# Patient Record
Sex: Female | Born: 1950
Health system: Southern US, Community
[De-identification: ages and names within clinical notes are randomized; demographics above are authoritative.]

## PROBLEM LIST (undated history)

## (undated) DIAGNOSIS — M1610 Unilateral primary osteoarthritis, unspecified hip: Secondary | ICD-10-CM

## (undated) DIAGNOSIS — D649 Anemia, unspecified: Secondary | ICD-10-CM

## (undated) DIAGNOSIS — M1712 Unilateral primary osteoarthritis, left knee: Secondary | ICD-10-CM

## (undated) DIAGNOSIS — R7302 Impaired glucose tolerance (oral): Secondary | ICD-10-CM

## (undated) DIAGNOSIS — R1900 Intra-abdominal and pelvic swelling, mass and lump, unspecified site: Secondary | ICD-10-CM

## (undated) DIAGNOSIS — T7840XA Allergy, unspecified, initial encounter: Secondary | ICD-10-CM

## (undated) DIAGNOSIS — E119 Type 2 diabetes mellitus without complications: Secondary | ICD-10-CM

## (undated) DIAGNOSIS — K219 Gastro-esophageal reflux disease without esophagitis: Secondary | ICD-10-CM

## (undated) DIAGNOSIS — H9190 Unspecified hearing loss, unspecified ear: Secondary | ICD-10-CM

## (undated) DIAGNOSIS — F419 Anxiety disorder, unspecified: Secondary | ICD-10-CM

## (undated) DIAGNOSIS — R002 Palpitations: Secondary | ICD-10-CM

## (undated) DIAGNOSIS — M899 Disorder of bone, unspecified: Secondary | ICD-10-CM

## (undated) DIAGNOSIS — M949 Disorder of cartilage, unspecified: Secondary | ICD-10-CM

## (undated) DIAGNOSIS — J189 Pneumonia, unspecified organism: Secondary | ICD-10-CM

## (undated) DIAGNOSIS — J209 Acute bronchitis, unspecified: Secondary | ICD-10-CM

## (undated) DIAGNOSIS — M1711 Unilateral primary osteoarthritis, right knee: Secondary | ICD-10-CM

## (undated) DIAGNOSIS — J069 Acute upper respiratory infection, unspecified: Secondary | ICD-10-CM

## (undated) DIAGNOSIS — E785 Hyperlipidemia, unspecified: Secondary | ICD-10-CM

## (undated) DIAGNOSIS — E041 Nontoxic single thyroid nodule: Secondary | ICD-10-CM

## (undated) HISTORY — PX: HAMMER TOE SURGERY: SHX385

## (undated) HISTORY — DX: Allergy, unspecified, initial encounter: T78.40XA

## (undated) HISTORY — PX: OTHER SURGICAL HISTORY: SHX169

## (undated) HISTORY — DX: Palpitations: R00.2

## (undated) HISTORY — DX: Hyperlipidemia, unspecified: E78.5

## (undated) HISTORY — PX: BREAST EXCISIONAL BIOPSY: SUR124

## (undated) HISTORY — DX: Acute upper respiratory infection, unspecified: J06.9

## (undated) HISTORY — DX: Type 2 diabetes mellitus without complications: E11.9

## (undated) HISTORY — DX: Anxiety disorder, unspecified: F41.9

## (undated) HISTORY — DX: Nontoxic single thyroid nodule: E04.1

## (undated) HISTORY — DX: Acute bronchitis, unspecified: J20.9

## (undated) HISTORY — DX: Impaired glucose tolerance (oral): R73.02

## (undated) HISTORY — DX: Intra-abdominal and pelvic swelling, mass and lump, unspecified site: R19.00

## (undated) HISTORY — DX: Gastro-esophageal reflux disease without esophagitis: K21.9

## (undated) HISTORY — PX: ABDOMINAL SURGERY: SHX537

## (undated) HISTORY — PX: DILATION AND CURETTAGE, DIAGNOSTIC / THERAPEUTIC: SUR384

## (undated) HISTORY — DX: Unspecified hearing loss, unspecified ear: H91.90

## (undated) HISTORY — DX: Disorder of cartilage, unspecified: M94.9

## (undated) HISTORY — DX: Disorder of bone, unspecified: M89.9

## (undated) HISTORY — DX: Anemia, unspecified: D64.9

---

## 1983-04-11 HISTORY — PX: OTHER SURGICAL HISTORY: SHX169

## 1989-04-10 HISTORY — PX: KNEE SURGERY: SHX244

## 1990-04-10 HISTORY — PX: HERNIA REPAIR: SHX51

## 1998-01-03 ENCOUNTER — Emergency Department (HOSPITAL_COMMUNITY): Admission: EM | Admit: 1998-01-03 | Discharge: 1998-01-03 | Payer: Self-pay | Admitting: Emergency Medicine

## 1998-01-17 ENCOUNTER — Emergency Department (HOSPITAL_COMMUNITY): Admission: EM | Admit: 1998-01-17 | Discharge: 1998-01-17 | Payer: Self-pay | Admitting: Emergency Medicine

## 1998-01-18 ENCOUNTER — Emergency Department (HOSPITAL_COMMUNITY): Admission: EM | Admit: 1998-01-18 | Discharge: 1998-01-18 | Payer: Self-pay | Admitting: Emergency Medicine

## 1998-04-10 DIAGNOSIS — R0989 Other specified symptoms and signs involving the circulatory and respiratory systems: Secondary | ICD-10-CM | POA: Insufficient documentation

## 2003-05-06 ENCOUNTER — Encounter: Admission: RE | Admit: 2003-05-06 | Discharge: 2003-05-06 | Payer: Self-pay | Admitting: Obstetrics and Gynecology

## 2003-07-14 ENCOUNTER — Ambulatory Visit (HOSPITAL_COMMUNITY): Admission: RE | Admit: 2003-07-14 | Discharge: 2003-07-14 | Payer: Self-pay | Admitting: Endocrinology

## 2004-06-30 ENCOUNTER — Ambulatory Visit: Payer: Self-pay | Admitting: Internal Medicine

## 2004-07-11 ENCOUNTER — Ambulatory Visit (HOSPITAL_COMMUNITY): Admission: RE | Admit: 2004-07-11 | Discharge: 2004-07-11 | Payer: Self-pay | Admitting: Internal Medicine

## 2004-09-19 ENCOUNTER — Ambulatory Visit: Payer: Self-pay | Admitting: Endocrinology

## 2004-09-29 ENCOUNTER — Encounter: Admission: RE | Admit: 2004-09-29 | Discharge: 2004-09-29 | Payer: Self-pay | Admitting: Obstetrics and Gynecology

## 2004-10-04 ENCOUNTER — Ambulatory Visit: Payer: Self-pay | Admitting: Endocrinology

## 2004-10-06 ENCOUNTER — Ambulatory Visit: Payer: Self-pay | Admitting: Endocrinology

## 2004-11-07 ENCOUNTER — Ambulatory Visit: Payer: Self-pay | Admitting: Internal Medicine

## 2004-11-11 ENCOUNTER — Ambulatory Visit: Payer: Self-pay | Admitting: Endocrinology

## 2004-12-19 ENCOUNTER — Ambulatory Visit: Payer: Self-pay | Admitting: Endocrinology

## 2005-03-01 ENCOUNTER — Ambulatory Visit: Payer: Self-pay | Admitting: Endocrinology

## 2005-06-12 ENCOUNTER — Ambulatory Visit: Payer: Self-pay | Admitting: Endocrinology

## 2005-06-15 ENCOUNTER — Ambulatory Visit: Payer: Self-pay | Admitting: Endocrinology

## 2005-06-30 ENCOUNTER — Ambulatory Visit: Payer: Self-pay | Admitting: Internal Medicine

## 2005-07-03 ENCOUNTER — Ambulatory Visit: Payer: Self-pay | Admitting: Endocrinology

## 2005-07-13 ENCOUNTER — Ambulatory Visit: Payer: Self-pay | Admitting: Internal Medicine

## 2005-07-13 LAB — HM COLONOSCOPY

## 2005-08-29 ENCOUNTER — Ambulatory Visit: Payer: Self-pay | Admitting: Internal Medicine

## 2005-09-21 ENCOUNTER — Ambulatory Visit: Payer: Self-pay | Admitting: Endocrinology

## 2005-12-18 ENCOUNTER — Ambulatory Visit: Payer: Self-pay | Admitting: Internal Medicine

## 2006-05-09 ENCOUNTER — Ambulatory Visit: Payer: Self-pay | Admitting: Internal Medicine

## 2006-06-15 ENCOUNTER — Ambulatory Visit: Payer: Self-pay | Admitting: Endocrinology

## 2006-06-15 LAB — CONVERTED CEMR LAB
ALT: 18 units/L (ref 0–40)
AST: 23 units/L (ref 0–37)
Albumin: 3.3 g/dL — ABNORMAL LOW (ref 3.5–5.2)
Alkaline Phosphatase: 54 units/L (ref 39–117)
BUN: 14 mg/dL (ref 6–23)
Basophils Absolute: 0 10*3/uL (ref 0.0–0.1)
Basophils Relative: 0.5 % (ref 0.0–1.0)
Bilirubin Urine: NEGATIVE
Bilirubin, Direct: 0.1 mg/dL (ref 0.0–0.3)
CO2: 28 meq/L (ref 19–32)
Calcium: 8.6 mg/dL (ref 8.4–10.5)
Chloride: 105 meq/L (ref 96–112)
Cholesterol: 144 mg/dL (ref 0–200)
Creatinine, Ser: 0.7 mg/dL (ref 0.4–1.2)
Eosinophils Absolute: 0.1 10*3/uL (ref 0.0–0.6)
Eosinophils Relative: 2 % (ref 0.0–5.0)
GFR calc Af Amer: 113 mL/min
GFR calc non Af Amer: 93 mL/min
Glucose, Bld: 93 mg/dL (ref 70–99)
HCT: 40.6 % (ref 36.0–46.0)
HDL: 49.7 mg/dL (ref >39.0)
Hemoglobin, Urine: NEGATIVE
Hemoglobin: 13.1 g/dL (ref 12.0–15.0)
Ketones, ur: NEGATIVE mg/dL
LDL Cholesterol: 75 mg/dL (ref 0–99)
Leukocytes, UA: NEGATIVE
Lymphocytes Relative: 32 % (ref 12.0–46.0)
MCHC: 32.3 g/dL (ref 30.0–36.0)
MCV: 74 fL — ABNORMAL LOW (ref 78.0–100.0)
Monocytes Absolute: 0.7 10*3/uL (ref 0.2–0.7)
Monocytes Relative: 13.7 % — ABNORMAL HIGH (ref 3.0–11.0)
Neutro Abs: 2.7 10*3/uL (ref 1.4–7.7)
Neutrophils Relative %: 51.8 % (ref 43.0–77.0)
Nitrite: NEGATIVE
Platelets: 219 10*3/uL (ref 150–400)
Potassium: 4.2 meq/L (ref 3.5–5.1)
RBC: 5.48 M/uL — ABNORMAL HIGH (ref 3.87–5.11)
RDW: 13.8 % (ref 11.5–14.6)
Sodium: 138 meq/L (ref 135–145)
Specific Gravity, Urine: 1.025 (ref 1.000–1.03)
TSH: 0.9 microintl units/mL (ref 0.35–5.50)
Total Bilirubin: 0.6 mg/dL (ref 0.3–1.2)
Total CHOL/HDL Ratio: 2.9
Total Protein, Urine: NEGATIVE mg/dL
Total Protein: 6.4 g/dL (ref 6.0–8.3)
Triglycerides: 96 mg/dL (ref 0–149)
Urine Glucose: NEGATIVE mg/dL
Urobilinogen, UA: 0.2 (ref 0.0–1.0)
VLDL: 19 mg/dL (ref 0–40)
WBC: 5.2 10*3/uL (ref 4.5–10.5)
pH: 6 (ref 5.0–8.0)

## 2006-06-18 ENCOUNTER — Ambulatory Visit: Payer: Self-pay | Admitting: Endocrinology

## 2006-09-20 ENCOUNTER — Encounter: Payer: Self-pay | Admitting: Endocrinology

## 2006-10-17 ENCOUNTER — Ambulatory Visit: Payer: Self-pay | Admitting: Endocrinology

## 2006-10-24 ENCOUNTER — Encounter: Payer: Self-pay | Admitting: Endocrinology

## 2006-11-02 ENCOUNTER — Encounter: Payer: Self-pay | Admitting: Endocrinology

## 2007-02-14 ENCOUNTER — Telehealth (INDEPENDENT_AMBULATORY_CARE_PROVIDER_SITE_OTHER): Payer: Self-pay | Admitting: *Deleted

## 2007-04-19 ENCOUNTER — Ambulatory Visit: Payer: Self-pay | Admitting: Internal Medicine

## 2007-04-19 DIAGNOSIS — M549 Dorsalgia, unspecified: Secondary | ICD-10-CM | POA: Insufficient documentation

## 2007-04-20 DIAGNOSIS — M949 Disorder of cartilage, unspecified: Secondary | ICD-10-CM | POA: Insufficient documentation

## 2007-04-20 DIAGNOSIS — M899 Disorder of bone, unspecified: Secondary | ICD-10-CM

## 2007-04-20 HISTORY — DX: Disorder of bone, unspecified: M89.9

## 2007-06-06 ENCOUNTER — Ambulatory Visit: Payer: Self-pay | Admitting: Endocrinology

## 2007-06-06 DIAGNOSIS — J069 Acute upper respiratory infection, unspecified: Secondary | ICD-10-CM

## 2007-06-06 HISTORY — DX: Acute upper respiratory infection, unspecified: J06.9

## 2007-06-25 ENCOUNTER — Ambulatory Visit: Payer: Self-pay | Admitting: Endocrinology

## 2007-06-26 LAB — CONVERTED CEMR LAB
ALT: 22 units/L (ref 0–35)
AST: 20 units/L (ref 0–37)
Alkaline Phosphatase: 73 units/L (ref 39–117)
BUN: 17 mg/dL (ref 6–23)
Basophils Absolute: 0.1 10*3/uL (ref 0.0–0.1)
Basophils Relative: 1.2 % — ABNORMAL HIGH (ref 0.0–1.0)
Bilirubin, Direct: 0.1 mg/dL (ref 0.0–0.3)
CO2: 29 meq/L (ref 19–32)
Calcium: 9.1 mg/dL (ref 8.4–10.5)
Cholesterol: 144 mg/dL (ref 0–200)
Creatinine, Ser: 0.7 mg/dL (ref 0.4–1.2)
Eosinophils Absolute: 0.2 10*3/uL (ref 0.0–0.6)
Eosinophils Relative: 2.7 % (ref 0.0–5.0)
GFR calc Af Amer: 112 mL/min
GFR calc non Af Amer: 93 mL/min
Glucose, Bld: 130 mg/dL — ABNORMAL HIGH (ref 70–99)
HCT: 40 % (ref 36.0–46.0)
Hemoglobin, Urine: NEGATIVE
Hemoglobin: 12.6 g/dL (ref 12.0–15.0)
Ketones, ur: NEGATIVE mg/dL
LDL Cholesterol: 82 mg/dL (ref 0–99)
Leukocytes, UA: NEGATIVE
Lymphocytes Relative: 34.8 % (ref 12.0–46.0)
MCHC: 31.5 g/dL (ref 30.0–36.0)
MCV: 73.7 fL — ABNORMAL LOW (ref 78.0–100.0)
Neutro Abs: 2.8 10*3/uL (ref 1.4–7.7)
Neutrophils Relative %: 50.1 % (ref 43.0–77.0)
Nitrite: NEGATIVE
Platelets: 289 10*3/uL (ref 150–400)
Potassium: 4.3 meq/L (ref 3.5–5.1)
RBC: 5.44 M/uL — ABNORMAL HIGH (ref 3.87–5.11)
RDW: 14.2 % (ref 11.5–14.6)
Sodium: 139 meq/L (ref 135–145)
Specific Gravity, Urine: >=1.03 (ref 1.000–1.03)
TSH: 1.19 microintl units/mL (ref 0.35–5.50)
Total CHOL/HDL Ratio: 3.2
Total Protein, Urine: NEGATIVE mg/dL
Total Protein: 6.7 g/dL (ref 6.0–8.3)
Triglycerides: 85 mg/dL (ref 0–149)
Urine Glucose: NEGATIVE mg/dL
Urobilinogen, UA: 0.2 (ref 0.0–1.0)
WBC: 5.7 10*3/uL (ref 4.5–10.5)

## 2007-06-27 ENCOUNTER — Ambulatory Visit: Payer: Self-pay | Admitting: Endocrinology

## 2007-09-13 ENCOUNTER — Encounter: Payer: Self-pay | Admitting: Endocrinology

## 2007-09-18 ENCOUNTER — Ambulatory Visit: Payer: Self-pay | Admitting: Endocrinology

## 2007-09-18 DIAGNOSIS — E119 Type 2 diabetes mellitus without complications: Secondary | ICD-10-CM

## 2007-09-18 DIAGNOSIS — R1900 Intra-abdominal and pelvic swelling, mass and lump, unspecified site: Secondary | ICD-10-CM

## 2007-09-18 HISTORY — DX: Type 2 diabetes mellitus without complications: E11.9

## 2007-09-18 HISTORY — DX: Intra-abdominal and pelvic swelling, mass and lump, unspecified site: R19.00

## 2007-09-18 LAB — CONVERTED CEMR LAB
ALT: 19 units/L (ref 0–35)
AST: 17 units/L (ref 0–37)
Albumin: 3.4 g/dL — ABNORMAL LOW (ref 3.5–5.2)
Alkaline Phosphatase: 67 units/L (ref 39–117)
Amylase: 82 units/L (ref 27–131)
BUN: 19 mg/dL (ref 6–23)
Basophils Absolute: 0.1 10*3/uL (ref 0.0–0.1)
Basophils Relative: 2 % — ABNORMAL HIGH (ref 0.0–1.0)
Bilirubin, Direct: 0.1 mg/dL (ref 0.0–0.3)
CO2: 30 meq/L (ref 19–32)
Calcium: 8.8 mg/dL (ref 8.4–10.5)
Chloride: 107 meq/L (ref 96–112)
Creatinine, Ser: 0.6 mg/dL (ref 0.4–1.2)
Eosinophils Absolute: 0.1 10*3/uL (ref 0.0–0.7)
Eosinophils Relative: 2.4 % (ref 0.0–5.0)
GFR calc Af Amer: 134 mL/min
GFR calc non Af Amer: 111 mL/min
Glucose, Bld: 108 mg/dL — ABNORMAL HIGH (ref 70–99)
HCT: 37.8 % (ref 36.0–46.0)
Hemoglobin: 12.4 g/dL (ref 12.0–15.0)
Hgb A1c MFr Bld: 6.2 % — ABNORMAL HIGH (ref 4.6–6.0)
Lymphocytes Relative: 30.9 % (ref 12.0–46.0)
MCHC: 32.8 g/dL (ref 30.0–36.0)
MCV: 75 fL — ABNORMAL LOW (ref 78.0–100.0)
Monocytes Absolute: 0.6 10*3/uL (ref 0.1–1.0)
Monocytes Relative: 10.7 % (ref 3.0–12.0)
Neutro Abs: 3.3 10*3/uL (ref 1.4–7.7)
Neutrophils Relative %: 54 % (ref 43.0–77.0)
Platelets: 211 10*3/uL (ref 150–400)
Potassium: 4.6 meq/L (ref 3.5–5.1)
RBC: 5.04 M/uL (ref 3.87–5.11)
RDW: 14 % (ref 11.5–14.6)
Sed Rate: 7 mm/hr (ref 0–22)
Sodium: 139 meq/L (ref 135–145)
Total Bilirubin: 0.4 mg/dL (ref 0.3–1.2)
Total Protein: 6.2 g/dL (ref 6.0–8.3)
WBC: 5.9 10*3/uL (ref 4.5–10.5)

## 2007-09-19 ENCOUNTER — Telehealth: Payer: Self-pay | Admitting: Internal Medicine

## 2007-09-19 ENCOUNTER — Encounter (INDEPENDENT_AMBULATORY_CARE_PROVIDER_SITE_OTHER): Payer: Self-pay | Admitting: *Deleted

## 2007-09-23 ENCOUNTER — Encounter: Payer: Self-pay | Admitting: Endocrinology

## 2007-10-04 ENCOUNTER — Ambulatory Visit: Payer: Self-pay | Admitting: Cardiology

## 2007-10-25 DIAGNOSIS — R142 Eructation: Secondary | ICD-10-CM

## 2007-10-25 DIAGNOSIS — R141 Gas pain: Secondary | ICD-10-CM

## 2007-10-25 DIAGNOSIS — R143 Flatulence: Secondary | ICD-10-CM

## 2007-10-29 ENCOUNTER — Ambulatory Visit: Payer: Self-pay | Admitting: Internal Medicine

## 2007-10-30 ENCOUNTER — Telehealth: Payer: Self-pay | Admitting: Internal Medicine

## 2007-10-31 ENCOUNTER — Encounter: Payer: Self-pay | Admitting: Internal Medicine

## 2007-12-02 ENCOUNTER — Telehealth: Payer: Self-pay | Admitting: Internal Medicine

## 2008-03-13 ENCOUNTER — Ambulatory Visit: Payer: Self-pay | Admitting: Endocrinology

## 2008-03-13 DIAGNOSIS — R002 Palpitations: Secondary | ICD-10-CM

## 2008-03-13 HISTORY — DX: Palpitations: R00.2

## 2008-03-13 LAB — CONVERTED CEMR LAB
Hgb A1c MFr Bld: 6.2 % — ABNORMAL HIGH (ref 4.6–6.0)
TSH: 0.84 microintl units/mL (ref 0.35–5.50)

## 2008-05-11 ENCOUNTER — Ambulatory Visit: Payer: Self-pay | Admitting: Internal Medicine

## 2008-05-11 DIAGNOSIS — E041 Nontoxic single thyroid nodule: Secondary | ICD-10-CM

## 2008-05-11 DIAGNOSIS — J209 Acute bronchitis, unspecified: Secondary | ICD-10-CM

## 2008-05-11 HISTORY — DX: Nontoxic single thyroid nodule: E04.1

## 2008-05-11 HISTORY — DX: Acute bronchitis, unspecified: J20.9

## 2008-05-20 ENCOUNTER — Encounter (INDEPENDENT_AMBULATORY_CARE_PROVIDER_SITE_OTHER): Payer: Self-pay | Admitting: *Deleted

## 2008-06-02 ENCOUNTER — Encounter: Payer: Self-pay | Admitting: Internal Medicine

## 2008-07-20 ENCOUNTER — Ambulatory Visit: Payer: Self-pay | Admitting: Endocrinology

## 2008-07-22 LAB — CONVERTED CEMR LAB
ALT: 21 units/L (ref 0–35)
AST: 21 units/L (ref 0–37)
Albumin: 3.8 g/dL (ref 3.5–5.2)
Alkaline Phosphatase: 64 units/L (ref 39–117)
BUN: 19 mg/dL (ref 6–23)
Basophils Absolute: 0 10*3/uL (ref 0.0–0.1)
Basophils Relative: 0.8 % (ref 0.0–3.0)
Bilirubin Urine: NEGATIVE
Bilirubin, Direct: 0.1 mg/dL (ref 0.0–0.3)
CO2: 31 meq/L (ref 19–32)
Calcium: 9.1 mg/dL (ref 8.4–10.5)
Chloride: 106 meq/L (ref 96–112)
Cholesterol: 183 mg/dL (ref 0–200)
Creatinine, Ser: 0.7 mg/dL (ref 0.4–1.2)
Eosinophils Absolute: 0.1 10*3/uL (ref 0.0–0.7)
Eosinophils Relative: 2.6 % (ref 0.0–5.0)
GFR calc non Af Amer: 111.52 mL/min (ref >60)
Glucose, Bld: 101 mg/dL — ABNORMAL HIGH (ref 70–99)
HCT: 39.5 % (ref 36.0–46.0)
HDL: 49.8 mg/dL (ref >39.00)
Hemoglobin, Urine: NEGATIVE
Hemoglobin: 12.9 g/dL (ref 12.0–15.0)
Ketones, ur: NEGATIVE mg/dL
LDL Cholesterol: 101 mg/dL — ABNORMAL HIGH (ref 0–99)
Leukocytes, UA: NEGATIVE
Lymphocytes Relative: 34.6 % (ref 12.0–46.0)
Lymphs Abs: 1.9 10*3/uL (ref 0.7–4.0)
MCHC: 32.7 g/dL (ref 30.0–36.0)
MCV: 74.6 fL — ABNORMAL LOW (ref 78.0–100.0)
Monocytes Absolute: 0.6 10*3/uL (ref 0.1–1.0)
Monocytes Relative: 11.4 % (ref 3.0–12.0)
Neutro Abs: 2.8 10*3/uL (ref 1.4–7.7)
Neutrophils Relative %: 50.6 % (ref 43.0–77.0)
Nitrite: NEGATIVE
Platelets: 242 10*3/uL (ref 150.0–400.0)
Potassium: 4.9 meq/L (ref 3.5–5.1)
RBC: 5.3 M/uL — ABNORMAL HIGH (ref 3.87–5.11)
RDW: 13.9 % (ref 11.5–14.6)
Sodium: 141 meq/L (ref 135–145)
Specific Gravity, Urine: >=1.03 (ref 1.000–1.030)
TSH: 0.63 microintl units/mL (ref 0.35–5.50)
Total Bilirubin: 0.7 mg/dL (ref 0.3–1.2)
Total CHOL/HDL Ratio: 4
Total Protein, Urine: NEGATIVE mg/dL
Total Protein: 7 g/dL (ref 6.0–8.3)
Triglycerides: 160 mg/dL — ABNORMAL HIGH (ref 0.0–149.0)
Urine Glucose: NEGATIVE mg/dL
Urobilinogen, UA: 0.2 (ref 0.0–1.0)
VLDL: 32 mg/dL (ref 0.0–40.0)
WBC: 5.4 10*3/uL (ref 4.5–10.5)
pH: 5.5 (ref 5.0–8.0)

## 2008-07-23 ENCOUNTER — Ambulatory Visit: Payer: Self-pay | Admitting: Endocrinology

## 2009-01-13 ENCOUNTER — Ambulatory Visit: Payer: Self-pay | Admitting: Endocrinology

## 2009-01-15 LAB — CONVERTED CEMR LAB
Hgb A1c MFr Bld: 6.1 % (ref 4.6–6.5)
Sed Rate: 8 mm/hr (ref 0–22)

## 2009-05-27 ENCOUNTER — Ambulatory Visit: Payer: Self-pay | Admitting: Endocrinology

## 2009-09-13 ENCOUNTER — Ambulatory Visit: Payer: Self-pay | Admitting: Endocrinology

## 2009-09-13 LAB — CONVERTED CEMR LAB
ALT: 25 units/L (ref 0–35)
AST: 26 units/L (ref 0–37)
Albumin: 4.3 g/dL (ref 3.5–5.2)
Alkaline Phosphatase: 66 units/L (ref 39–117)
BUN: 21 mg/dL (ref 6–23)
Basophils Absolute: 0.1 10*3/uL (ref 0.0–0.1)
Basophils Relative: 0.9 % (ref 0.0–3.0)
Bilirubin Urine: NEGATIVE
Bilirubin, Direct: 0.1 mg/dL (ref 0.0–0.3)
CO2: 31 meq/L (ref 19–32)
Calcium: 9.3 mg/dL (ref 8.4–10.5)
Chloride: 106 meq/L (ref 96–112)
Cholesterol: 237 mg/dL — ABNORMAL HIGH (ref 0–200)
Creatinine, Ser: 0.6 mg/dL (ref 0.4–1.2)
Direct LDL: 151.5 mg/dL
Eosinophils Absolute: 0.2 10*3/uL (ref 0.0–0.7)
Eosinophils Relative: 2.5 % (ref 0.0–5.0)
GFR calc non Af Amer: 127.75 mL/min (ref >60)
Glucose, Bld: 101 mg/dL — ABNORMAL HIGH (ref 70–99)
HCT: 40 % (ref 36.0–46.0)
HDL: 64.8 mg/dL (ref >39.00)
Hemoglobin, Urine: NEGATIVE
Hemoglobin: 13.2 g/dL (ref 12.0–15.0)
Ketones, ur: NEGATIVE mg/dL
Leukocytes, UA: NEGATIVE
Lymphocytes Relative: 38.2 % (ref 12.0–46.0)
Lymphs Abs: 2.4 10*3/uL (ref 0.7–4.0)
MCHC: 33 g/dL (ref 30.0–36.0)
MCV: 75.3 fL — ABNORMAL LOW (ref 78.0–100.0)
Monocytes Absolute: 0.6 10*3/uL (ref 0.1–1.0)
Monocytes Relative: 10.4 % (ref 3.0–12.0)
Neutro Abs: 3 10*3/uL (ref 1.4–7.7)
Neutrophils Relative %: 48 % (ref 43.0–77.0)
Nitrite: NEGATIVE
Platelets: 240 10*3/uL (ref 150.0–400.0)
Potassium: 4.4 meq/L (ref 3.5–5.1)
RBC: 5.31 M/uL — ABNORMAL HIGH (ref 3.87–5.11)
RDW: 15.1 % — ABNORMAL HIGH (ref 11.5–14.6)
Sodium: 142 meq/L (ref 135–145)
Specific Gravity, Urine: >=1.03 (ref 1.000–1.030)
TSH: 0.99 microintl units/mL (ref 0.35–5.50)
Total Bilirubin: 0.6 mg/dL (ref 0.3–1.2)
Total CHOL/HDL Ratio: 4
Total Protein, Urine: NEGATIVE mg/dL
Total Protein: 7.4 g/dL (ref 6.0–8.3)
Triglycerides: 144 mg/dL (ref 0.0–149.0)
Urine Glucose: NEGATIVE mg/dL
Urobilinogen, UA: 0.2 (ref 0.0–1.0)
VLDL: 28.8 mg/dL (ref 0.0–40.0)
WBC: 6.2 10*3/uL (ref 4.5–10.5)
pH: 5.5 (ref 5.0–8.0)

## 2009-09-15 ENCOUNTER — Ambulatory Visit: Payer: Self-pay | Admitting: Endocrinology

## 2009-09-15 DIAGNOSIS — H9190 Unspecified hearing loss, unspecified ear: Secondary | ICD-10-CM

## 2009-09-15 HISTORY — DX: Unspecified hearing loss, unspecified ear: H91.90

## 2009-10-08 LAB — HM MAMMOGRAPHY: HM Mammogram: NORMAL

## 2009-10-08 LAB — CONVERTED CEMR LAB: Pap Smear: NORMAL

## 2010-03-10 ENCOUNTER — Ambulatory Visit: Payer: Self-pay | Admitting: Endocrinology

## 2010-03-15 ENCOUNTER — Encounter (INDEPENDENT_AMBULATORY_CARE_PROVIDER_SITE_OTHER): Payer: Self-pay | Admitting: *Deleted

## 2010-04-25 ENCOUNTER — Encounter: Payer: Self-pay | Admitting: Internal Medicine

## 2010-04-25 ENCOUNTER — Ambulatory Visit
Admission: RE | Admit: 2010-04-25 | Discharge: 2010-04-25 | Payer: Self-pay | Source: Home / Self Care | Attending: Internal Medicine | Admitting: Internal Medicine

## 2010-04-25 DIAGNOSIS — E785 Hyperlipidemia, unspecified: Secondary | ICD-10-CM | POA: Insufficient documentation

## 2010-04-25 HISTORY — DX: Hyperlipidemia, unspecified: E78.5

## 2010-04-28 ENCOUNTER — Ambulatory Visit: Admit: 2010-04-28 | Payer: Self-pay | Admitting: Internal Medicine

## 2010-05-08 LAB — CONVERTED CEMR LAB
Hgb A1c MFr Bld: 6.2 % (ref 4.6–6.5)
Hgb A1c MFr Bld: 6.3 % — ABNORMAL HIGH (ref 4.6–6.0)
Sed Rate: 7 mm/hr (ref 0–22)
TSH: 0.91 microintl units/mL (ref 0.35–5.50)

## 2010-05-12 NOTE — Assessment & Plan Note (Signed)
Summary: abd pain/#/cd   Vital Signs:  Patient profile:   60 year old female Height:      66 inches (167.64 cm) Weight:      155.75 pounds (70.80 kg) BMI:     25.23 O2 Sat:      96 % on Room air Temp:     97.9 degrees F (36.61 degrees C) oral Pulse rate:   83 / minute Pulse rhythm:   regular BP sitting:   122 / 78  (left arm) Cuff size:   regular  Vitals Entered By: Brenton Grills CMA Duncan Dull) (March 10, 2010 11:17 AM)  O2 Flow:  Room air CC: Abdominal pain, "pinching" feeling in upper part of stomach/pt is not currently taking any medications/aj Is Patient Diabetic? Yes Comments Pt declined flu shot   Referring Makinlee Awwad:  r holland Primary Servando Kyllonen:  Romero Belling, MD  CC:  Abdominal pain and "pinching" feeling in upper part of stomach/pt is not currently taking any medications/aj.  History of Present Illness: pt states 1 month of intermittent "pinching"-quality pain at the epigastric area.  it can happen at any time of day or situation.  it is not related to meals.    Current Medications (verified): 1)  Hydroxyzine Hcl 25 Mg Tabs (Hydroxyzine Hcl) .... Take1 Tab Every 4 Hrs As Needed For Itching 2)  Pravastatin Sodium 40 Mg Tabs (Pravastatin Sodium) .... 2 At Bedtime  Allergies (verified): 1)  ! Hydrocodone  Past History:  Past Medical History: Last updated: 05/11/2008 Chronic Idopathic pruritis Hyperlipidemia glucose intolerance ABDOMINAL BLOATING (ICD-787.3) ABDOMINAL/PELVIC SWELLING MASS/LUMP UNSPEC SITE (ICD-789.30) DM (ICD-250.00) PAIN IN JOINT PELVIC REGION AND THIGH (ICD-719.45) ROUTINE GENERAL MEDICAL EXAM@HEALTH  CARE FACL (ICD-V70.0) ACUTE URIS OF UNSPECIFIED SITE (ICD-465.9) BACK PAIN (ICD-724.5) OSTEOPENIA (ICD-733.90)  Review of Systems  The patient denies hematochezia.         denies n/v.  she reports slight weight gain  Physical Exam  General:  normal appearance.   Abdomen:  abdomen is soft, nontender.  no hepatosplenomegaly.   not  distended.  no hernia    Impression & Recommendations:  Problem # 1:  ABDOMINAL BLOATING (ICD-787.3) uncertain etiology  Other Orders: Gastroenterology Referral (GI) Est. Patient Level III (41324)  Patient Instructions: 1)  here are some samples of "dexilant," to take 1 once daily 2)  refer back to dr Juanda Chance.  you will be called with a day and time for an appointment.   Orders Added: 1)  Gastroenterology Referral [GI] 2)  Est. Patient Level III [401[address]]     Preventive Care Screening  Mammogram:    Date:  10/08/2009    Results:  normal   Pap Smear:    Date:  10/08/2009    Results:  normal

## 2010-05-12 NOTE — Letter (Signed)
Summary: [facility] Consult Scheduled Letter  [facility]  [address] [place], [place] [address]   Phone: [phone]  Fax: [phone]      03/15/2010 MRN: [id number]  [facility] [address] [place], [place]  [address]    Dear Brenda [name],      We have scheduled an appointment for you.  At the recommendation of Dr.Ellison, we have scheduled you a consult with Dr Juanda Chance on 04/25/10 at 945am.  Their phone number is [phone].  If this appointment day and time is not convenient for you, please feel free to call the office of the doctor you are being referred to at the number listed above and reschedule the appointment.     [facility] [address] [place], [place] [address] *Gastroenterology Dept.3rd Floor*   Please give 24hr notice if you need to cancel/reschedule to avoid a $50.00 fee.Also bring insurance card and any co-pay due at time of visit.    Thank you,   Patient Care Coordinator [facility]

## 2010-05-12 NOTE — Assessment & Plan Note (Signed)
Summary: ABD BLOATING./YF    History of Present Illness Visit Type: Initial Consult Primary GI MD: Lina Sar MD Primary Provider: Romero Belling, MD Requesting Provider: Romero Belling, MD Chief Complaint: Pt c/o bloating, and epigastric pain  History of Present Illness:   This is a 60 year old African American female with left upper quadrant abdominal pain localized to the left costal margin anteriorly. It occurs independent of eating and has been present  since November 2011. She has not tried any over-the-counter medication. She has a history of irritable bowel syndrome with predominant bloating and abdominal distention. Her last office visit in July 2009 showed a weight gain of 40 pounds to 166 pounds. She since then has lost down to 143 pounds and regained it back. She is currently 158 pounds. A CT scan of the abdomen in June 2009 showed a 5 mm cyst in the left lobe of the liver and kidneys. She does not smoke. She does not take any NSAIDs. Her father died of gastric cancer after having a total gastrectomy. She would like to have it checked because of fear of gastric cancer.   GI Review of Systems    Reports abdominal pain and  bloating.     Location of  Abdominal pain: epigastric area.    Denies acid reflux, belching, chest pain, dysphagia with liquids, dysphagia with solids, heartburn, loss of appetite, nausea, vomiting, vomiting blood, weight loss, and  weight gain.        Denies anal fissure, black tarry stools, change in bowel habit, constipation, diarrhea, diverticulosis, fecal incontinence, heme positive stool, hemorrhoids, irritable bowel syndrome, jaundice, light color stool, liver problems, rectal bleeding, and  rectal pain.    Current Medications (verified): 1)  Hydroxyzine Hcl 25 Mg Tabs (Hydroxyzine Hcl) .... Take1 Tab Every 4 Hrs As Needed For Itching 2)  Pravastatin Sodium 40 Mg Tabs (Pravastatin Sodium) .... 2 At Bedtime  Allergies (verified): 1)  ! Hydrocodone  Past  History:  Past Medical History: glucose intolerance ADENOCARCINOMA, COLON, FAMILY HX (ICD-V16.0) HYPERLIPIDEMIA (ICD-272.4) TOE PAIN (ICD-729.5) HEARING LOSS (ICD-389.9) NECK PAIN (ICD-723.1) HIP PAIN, RIGHT (ICD-719.45) THYROID NODULE, RIGHT (ICD-241.0) ASTHMATIC BRONCHITIS, ACUTE (ICD-466.0) SINUSITIS- ACUTE-NOS (ICD-461.9) PALPITATIONS (ICD-785.1) ABDOMINAL BLOATING (ICD-787.3) ABDOMINAL/PELVIC SWELLING MASS/LUMP UNSPEC SITE (ICD-789.30) DM (ICD-250.00) PAIN IN JOINT PELVIC REGION AND THIGH (ICD-719.45) ROUTINE GENERAL MEDICAL EXAM@HEALTH  CARE FACL (ICD-V70.0) ACUTE URIS OF UNSPECIFIED SITE (ICD-465.9) BACK PAIN (ICD-724.5) OSTEOPENIA (ICD-733.90)  Past Surgical History: Reviewed history from 10/25/2007 and no changes required. Left Knee cyst (1985) Left Knee surgery (1991) Hernia repair (1992) Lymph node removal D & C  Family History: mother with breast cancer father wtih "stomach" cancer Kidney Transplant: Brother Family History of Colon Cancer: Sister   Social History: Occupation: Doctor, hospital Admin Married  Alcohol use-yes Former Smoker  Review of Systems  The patient denies allergy/sinus, anemia, anxiety-new, arthritis/joint pain, back pain, blood in urine, breast changes/lumps, change in vision, confusion, cough, coughing up blood, depression-new, fainting, fatigue, fever, headaches-new, hearing problems, heart murmur, heart rhythm changes, itching, menstrual pain, muscle pains/cramps, night sweats, nosebleeds, pregnancy symptoms, shortness of breath, skin rash, sleeping problems, sore throat, swelling of feet/legs, swollen lymph glands, thirst - excessive , urination - excessive , urination changes/pain, urine leakage, vision changes, and voice change.         .  Vital Signs:  Patient profile:   60 year old female Height:      66 inches Weight:      158 pounds BMI:     25.59 BSA:  1.81 Pulse rate:   96 / minute Pulse rhythm:   regular BP sitting:    128 / 76  (left arm) Cuff size:   regular  Vitals Entered By: Ok Anis CMA (April 25, 2010 9:54 AM)  Physical Exam  General:  Well developed, well nourished, no acute distress. Eyes:  PERRLA, no icterus. Mouth:  No deformity or lesions, dentition normal. Neck:  Supple; no masses or thyromegaly. Lungs:  Clear throughout to auscultation. Heart:  Regular rate and rhythm; no murmurs, rubs,  or bruits. Abdomen:  abdomen with normoactive bowel sounds. Tenderness in the subxiphoid area into the left lateral left costal margin. No rebound or pulsations. No bruit. No CVA tenderness. Liver edge at costal margin. Lower abdomen unremarkable. Extremities:  No clubbing, cyanosis, edema or deformities noted. Skin:  Intact without significant lesions or rashes. Psych:  Alert and cooperative. Normal mood and affect.   Impression & Recommendations:  Problem # 1:  ABDOMINAL BLOATING (ICD-787.3)  Patient has abdominal bloating and left upper quadrant abdominal discomfort most likely gastritis. We need to rule out H. pylori gastropathy. She has a family history of gastric cancer in her father. We will proceed with an upper endoscopy and start her on Prilosec 20 mg daily.  Orders: EGD (EGD)  Problem # 2:  ADENOCARCINOMA, COLON, FAMILY HX (ICD-V16.0) Patient's last colonoscopy was in April 2007. A recall colonoscopy will be due around April 2012.  Patient Instructions: 1)  You have been scheduled for an endoscopy. Please follow written prep instructions that were given to you today at your visit. 2)  Please pick up your prescriptions at the pharmacy. Electronic prescription(s) has already been sent for Prilosec 20 mg daily. 3)  Copy sent to : Dr Romero Belling 4)  The medication list was reviewed and reconciled.  All changed / newly prescribed medications were explained.  A complete medication list was provided to the patient / caregiver. Prescriptions: PRILOSEC 20 MG CPDR (OMEPRAZOLE) Take 1 tablet  by mouth once a day  #30 x 2   Entered by:   Lamona Curl CMA (AAMA)   Authorized by:   Hart Carwin MD   Signed by:   Lamona Curl CMA (AAMA) on 04/25/2010   Method used:   Electronically to        [facility] (retail)       [address]       [place], [place]  [address]       Ph: [phone]       Fax: [phone]   RxID:   [id number]

## 2010-05-12 NOTE — Assessment & Plan Note (Signed)
Summary: CPX/BCBS/#/CD   Vital Signs:  Patient profile:   60 year old female Height:      66 inches (167.64 cm) Weight:      148.12 pounds (67.33 kg) O2 Sat:      98 % on Room air Temp:     98.1 degrees F (36.72 degrees C) oral Pulse rate:   106 / minute BP sitting:   102 / 62  (left arm) Cuff size:   regular  Vitals Entered By: Orlan Leavens (September 15, 2009 9:00 AM)  O2 Flow:  Room air CC: CPX Is Patient Diabetic? No   Referring Provider:  r holland Primary Provider:  Romero Belling, MD  CC:  CPX.  History of Present Illness: here for regular wellness examination.  she's feeling pretty well in general, and does not drink or smoke.   Current Medications (verified): 1)  Hydroxyzine Hcl 25 Mg Tabs (Hydroxyzine Hcl) .... Take As Needed  Allergies (verified): 1)  ! Hydrocodone  Family History: Reviewed history from 10/29/2007 and no changes required. mother with breast cancer father wtih "stomach" cancer Kidney Transplant: Brother  Social History: Reviewed history from 04/19/2007 and no changes required. Alcohol use-yes Former Smoker Married works Landscape architect  Review of Systems  The patient denies fever, vision loss, chest pain, syncope, dyspnea on exertion, prolonged cough, headaches, abdominal pain, melena, hematochezia, severe indigestion/heartburn, hematuria, suspicious skin lesions, and depression.    Physical Exam  General:  normal appearance.   Head:  head: no deformity eyes: no periorbital swelling, no proptosis external nose and ears are normal mouth: no lesion seen Neck:  Supple without thyroid enlargement or tenderness.  Breasts:  sees gyn  Heart:  Regular rate and rhythm without murmurs or gallops noted. Normal S1,S2.   Abdomen:  abdomen is soft, nontender.  no hepatosplenomegaly.   not distended.  no hernia  Rectal:  sees gyn  Genitalia:  sees gyn  Msk:  muscle bulk and strength are grossly normal.  no obvious joint swelling.  gait is  normal and steady  Neurologic:  cn 2-12 grossly intact.   readily moves all 4's.   sensation is intact to touch on the feet  Skin:  normal texture and temp.  no rash.  not diaphoretic mild sunburn Cervical Nodes:  No significant adenopathy.  Psych:  Alert and cooperative; normal mood and affect; normal attention span and concentration.   Additional Exam:  SEPARATE EVALUATION FOLLOWS--EACH PROBLEM HERE IS NEW, NOT RESPONDING TO TREATMENT, OR POSES SIGNIFICANT RISK TO THE PATIENT'S HEALTH: HISTORY OF THE PRESENT ILLNESS: she has never been on cholesterol medication pt states few mos of slight pain at the toenails.  no assoc numbness PAST MEDICAL HISTORY reviewed and up to date today REVIEW OF SYSTEMS: she has lost a few lbs, due to her efforts. PHYSICAL EXAMINATION: no deformity.  no ulcer on the feet.  feet are of normal color and temp.  no edema dorsalis pedis intact bilat.  no carotid bruit clear to auscultation.  no respiratory distress LAB/XRAY RESULTS: Cholesterol LDL        151.5 mg/dL IMPRESSION: toe pain, uncertain etiology dyslipidemia, needs increased rx PLAN: see instruction sheet   Impression & Recommendations:  Problem # 1:  ROUTINE GENERAL MEDICAL EXAM@HEALTH  CARE FACL (ICD-V70.0)  Medications Added to Medication List This Visit: 1)  Hydroxyzine Hcl 25 Mg Tabs (Hydroxyzine hcl) .... Take as needed 2)  Hydroxyzine Hcl 25 Mg Tabs (Hydroxyzine hcl) .... Take1 tab every 4 hrs as  needed for itching 3)  Pravastatin Sodium 40 Mg Tabs (Pravastatin sodium) .... 2 at bedtime  Other Orders: EKG w/ Interpretation (93[address]) Podiatry Referral (Podiatry) Est. Patient 40-64 years ([address]) Est. Patient Level III (81191)  Preventive Care Screening     gyn is dr Marcelle Overlie, who does mammography and dexa   Patient Instructions: 1)  pravastatin 80 mg at bedtime 2)  go to lab in 3 months for lipids 272.0, and liver v58.69. 3)  refer podiatry.  you will be called with a day and  time for an appointment. 4)  please consider these measures for your health:  minimize alcohol.  do not use tobacco products.  have a colonoscopy at least every 10 years from age 56.  keep firearms safely stored.  always use seat belts.  have working smoke alarms in your home.  see the dentist regularly.  never drive under the influence of alcohol or drugs (including prescription drugs).  you should take precautions against the sun. Prescriptions: HYDROXYZINE HCL 25 MG TABS (HYDROXYZINE HCL) take1 tab every 4 hrs as needed for itching  #100 x 11   Entered and Authorized by:   Minus Breeding MD   Signed by:   Minus Breeding MD on 09/15/2009   Method used:   Electronically to        [facility] (retail)       [address]       [place], [place]  [address]       Ph: [phone]       Fax: [phone]   RxID:   [id number] PRAVASTATIN SODIUM 40 MG TABS (PRAVASTATIN SODIUM) 2 at bedtime  #90 x 8   Entered and Authorized by:   Minus Breeding MD   Signed by:   Minus Breeding MD on 09/15/2009   Method used:   Electronically to        [facility] (retail)       [address]       [place], [place]  [address]       Ph: [phone]       Fax: [phone]   RxID:   [id number]

## 2010-05-12 NOTE — Assessment & Plan Note (Signed)
Summary: SPASM/HEAD AND L NECK PAIN AND SPASM--STC   Vital Signs:  Patient profile:   60 year old female Height:      66 inches (167.64 cm) Weight:      151 pounds (68.64 kg) BMI:     24.46 O2 Sat:      96 % on Room air Temp:     98.3 degrees F (36.83 degrees C) oral Pulse rate:   99 / minute BP sitting:   118 / 82  (left arm) Cuff size:   large  Vitals Entered By: Josph Macho RMA (May 27, 2009 3:24 PM)  O2 Flow:  Room air CC: Head and Left neck spasms X1day/ pt states she is no longer taking Hydroxyzine/ CF Is Patient Diabetic? Yes   Referring Provider:  r holland Primary Provider:  Romero Belling, MD  CC:  Head and Left neck spasms X1day/ pt states she is no longer taking Hydroxyzine/ CF.  History of Present Illness: 3 days ago, slipped and fell at home, striking her right knee, and broke her fall with her right wrist.  now she has 12 hrs of pain at the left posterior neck, and associated tingling.  she did not hit her head, but feels she might have injured herself by breaking her fall with one ore both wrists. she has dm which has not required medication. she reports fatigue.   she also has tachypalpitations at night.   she reports "sensation" in the left ear  Current Medications (verified): 1)  Hydroxyzine Hcl 25 Mg  Tabs (Hydroxyzine Hcl) .... Take Prn  Allergies (verified): 1)  ! Hydrocodone  Past History:  Past Medical History: Last updated: 05/11/2008 Chronic Idopathic pruritis Hyperlipidemia glucose intolerance ABDOMINAL BLOATING (ICD-787.3) ABDOMINAL/PELVIC SWELLING MASS/LUMP UNSPEC SITE (ICD-789.30) DM (ICD-250.00) PAIN IN JOINT PELVIC REGION AND THIGH (ICD-719.45) ROUTINE GENERAL MEDICAL EXAM@HEALTH  CARE FACL (ICD-V70.0) ACUTE URIS OF UNSPECIFIED SITE (ICD-465.9) BACK PAIN (ICD-724.5) OSTEOPENIA (ICD-733.90)  Review of Systems  The patient denies syncope.         denies numbness.  Physical Exam  General:  normal appearance.   Ears:   left tm is rede.  right is normal Neck:  supple.  nontender Msk:  muscle bulk and strength are normal throughout the upper extremities.  no obvious joint swelling.  gait is normal and steady  Neurologic:  sensation is intact to touch on the upper extremities Additional Exam:  FastTSH                   0.91 uIU/mL                 0.35-5.50 Hemoglobin A1C            6.2 %                       4.6-6.5 Sed Rate                  7 mm/hr     Impression & Recommendations:  Problem # 1:  NECK PAIN (ICD-723.1) uncertain etiology  Problem # 2:  DM (ICD-250.00) well-controlled  Problem # 3:  aom new  Problem # 4:  PALPITATIONS (ICD-785.1) uncertain etiology  Medications Added to Medication List This Visit: 1)  Cefuroxime Axetil 250 Mg Tabs (Cefuroxime axetil) .Marland Kitchen.. 1 bid  Other Orders: EKG w/ Interpretation ([address]) TLB-TSH (Thyroid Stimulating Hormone) (84443-TSH) TLB-A1C / Hgb A1C (Glycohemoglobin) (83036-A1C) TLB-Sedimentation Rate (ESR) (85652-ESR) Est. Patient Level IV ([address])  Patient Instructions: 1)  prescription for neck symptoms is declined. 2)  cefuroxime 250 mg two times a day. 3)  tests are being ordered for you today.  a few days after the test(s), please call [phone] to hear your test results. Prescriptions: CEFUROXIME AXETIL 250 MG TABS (CEFUROXIME AXETIL) 1 bid  #14 x 0   Entered and Authorized by:   Minus Breeding MD   Signed by:   Minus Breeding MD on 05/27/2009   Method used:   Electronically to        [facility] (retail)       [address]       [place], [place]  [address]       Ph: [phone]       Fax: [phone]   RxID:   [id number]

## 2010-05-12 NOTE — Letter (Signed)
Summary: EGD Instructions  [facility]  [address] [place], [place] [address]   Phone: [phone]  Fax: [phone]       Brenda [name]    [birth year]    MRN: [id number]       Procedure Day /Date: Tuesday 06/07/10     Arrival Time: 10:00 am     Procedure Time: 11:00 am     Location of Procedure:                    _x  _ [facility] (4th Floor)  PREPARATION FOR ENDOSCOPY   On 06/07/10 THE DAY OF THE PROCEDURE:  1.   No solid foods, milk or milk products are allowed after midnight the night before your procedure.  2.   Do not drink anything colored red or purple.  Avoid juices with pulp.  No orange juice.  3.  You may drink clear liquids until 9:00 am, which is 2 hours before your procedure.                                                                                                CLEAR LIQUIDS INCLUDE: Water Jello Ice Popsicles Tea (sugar ok, no milk/cream) Powdered fruit flavored drinks Coffee (sugar ok, no milk/cream) Gatorade Juice: apple, white grape, white cranberry  Lemonade Clear bullion, consomm, broth Carbonated beverages (any kind) Strained chicken noodle soup Hard Candy   MEDICATION INSTRUCTIONS  Unless otherwise instructed, you should take regular prescription medications with a small sip of water as early as possible the morning of your procedure.                  OTHER INSTRUCTIONS  You will need a responsible adult at least 58 years of age to accompany you and drive you home.   This person must remain in the waiting room during your procedure.  Wear loose fitting clothing that is easily removed.  Leave jewelry and other valuables at home.  However, you may wish to bring a book to read or an iPod/MP3 player to listen to music as you wait for your procedure to start.  Remove all body piercing jewelry and leave at home.  Total time from sign-in until discharge is approximately 2-3 hours.  You should go  home directly after your procedure and rest.  You can resume normal activities the day after your procedure.  The day of your procedure you should not:   Drive   Make legal decisions   Operate machinery   Drink alcohol   Return to work  You will receive specific instructions about eating, activities and medications before you leave.    The above instructions have been reviewed and explained to me by   _______________________    I fully understand and can verbalize these instructions _____________________________ Date _________

## 2010-06-07 ENCOUNTER — Other Ambulatory Visit: Payer: Self-pay | Admitting: Internal Medicine

## 2010-06-07 ENCOUNTER — Encounter: Payer: Self-pay | Admitting: Internal Medicine

## 2010-06-07 ENCOUNTER — Other Ambulatory Visit (AMBULATORY_SURGERY_CENTER): Payer: 59 | Admitting: Internal Medicine

## 2010-06-07 DIAGNOSIS — K2900 Acute gastritis without bleeding: Secondary | ICD-10-CM

## 2010-06-07 DIAGNOSIS — Z800 Family history of malignant neoplasm of digestive organs: Secondary | ICD-10-CM

## 2010-06-07 DIAGNOSIS — K314 Gastric diverticulum: Secondary | ICD-10-CM

## 2010-06-07 DIAGNOSIS — R1012 Left upper quadrant pain: Secondary | ICD-10-CM

## 2010-06-13 ENCOUNTER — Encounter: Payer: Self-pay | Admitting: Internal Medicine

## 2010-06-16 NOTE — Miscellaneous (Signed)
Summary: levsin prescription  Clinical Lists Changes  Medications: Added new medication of LEVSIN 0.125 MG  TABS (HYOSCYAMINE SULFATE) every 4 hours as needed for abd pain. - Signed Rx of LEVSIN 0.125 MG  TABS (HYOSCYAMINE SULFATE) every 4 hours as needed for abd pain.;  #30 x 1;  Signed;  Entered by: Laverna Peace RN;  Authorized by: Hart Carwin MD;  Method used: Electronically to [facility] [address] [place], [place], [place]  [address], Ph: [phone], Fax: [phone]    Prescriptions: LEVSIN 0.125 MG  TABS (HYOSCYAMINE SULFATE) every 4 hours as needed for abd pain.  #30 x 1   Entered by:   Laverna Peace RN   Authorized by:   Hart Carwin MD   Signed by:   Laverna Peace RN on 06/07/2010   Method used:   Electronically to        [facility] (retail)       [address]       [place], [place]  [address]       Ph: [phone]       Fax: [phone]   RxID:   [id number]

## 2010-06-16 NOTE — Procedures (Addendum)
Summary: Upper Endoscopy  Patient: Brenda [name] Note: All result statuses are Final unless otherwise noted.  Tests: (1) Upper Endoscopy (EGD)   EGD Upper Endoscopy       DONE     [facility]     [address]     [place], [place]  [address]          ENDOSCOPY PROCEDURE REPORT          PATIENT:  Brenda [name], Brenda [name]  MR#:  [id number]     BIRTHDATE:  08-24-1950, 59 yrs. old  GENDER:  female          ENDOSCOPIST:  Hedwig Morton. Juanda Chance, MD     Referred by:  Cleophas Dunker Everardo All, M.D.          PROCEDURE DATE:  06/07/2010     PROCEDURE:  EGD with biopsy, 432[address]     ASA CLASS:  Class I     INDICATIONS:  abdominal pain, left upper quadr family hx of     gastric ca in father          MEDICATIONS:   Versed 5 mg, Fentanyl 50 mcg     TOPICAL ANESTHETIC:  Exactacain Spray          DESCRIPTION OF PROCEDURE:   After the risks benefits and     alternatives of the procedure were thoroughly explained, informed     consent was obtained.  The LB GIF-H180 [id number] endoscope was     introduced through the mouth and advanced to the second portion of     the duodenum, without limitations.  The instrument was slowly     withdrawn as the mucosa was fully examined.     <<PROCEDUREIMAGES>>          A diverticulum was found in the antrum. diverticular-like     outpuching in the prepyloric antrum, normal appearing mucosa,     about 1 cm in diameter With standard forceps, a biopsy was     obtained and sent to pathology (see image2 and image4).  Otherwise     the examination was normal (see image1 and image5).    Retroflexed     views revealed no abnormalities.    The scope was then withdrawn     from the patient and the procedure completed.          COMPLICATIONS:  None          ENDOSCOPIC IMPRESSION:     1) Diverticulum in the antrum     2) Otherwise normal examination     nothing to explain abvd. pain, the anatomic abnormality in the     antrum may be further evaluated with UGI series to look for a    "fistula", but would await results of the biopsies     RECOMMENDATIONS:     1) Await biopsy results     colonoscopy     continue Prelosec 20 mg po qd,     Levsin SL.125mg , q 4 hrs prn abd. pain          REPEAT EXAM:  In 0 year(s) for.          ______________________________     Hedwig Morton. Juanda Chance, MD          CC:          n.     eSIGNED:   Hedwig Morton. Cendy Oconnor at 06/07/2010 12:06 PM          Brenda [name], [id number]  Note: An exclamation mark (!) indicates a result that was not dispersed into the flowsheet. Document Creation Date: 06/07/2010 12:06 PM _______________________________________________________________________  (1) Order result status: Final Collection or observation date-time: 06/07/2010 11:56 Requested date-time:  Receipt date-time:  Reported date-time:  Referring Physician:   Ordering Physician: Lina Sar [phone]) Specimen Source:  Source: Launa Grill Order Number: [id number] Lab site:

## 2010-06-21 NOTE — Letter (Signed)
Summary: Patient [facility] Biopsy Results  [facility]  [address] [place], [place] [address]   Phone: [phone]  Fax: [phone]        June 13, 2010 MRN: [id number]    [facility] [address] [place], [place]  [address]    Dear Ms. Newsham,  I am pleased to inform you that the biopsies taken during your recent endoscopic examination did not show any evidence of cancer upon pathologic examination  Additional information/recommendations:  __No further action is needed at this time.  Please follow-up with      your primary care physician for your other healthcare needs.  __ Please call [phone] to schedule a return visit to review      your condition.  _x_ Continue with the treatment plan as outlined on the day of your      exam.     Please call us if you are having persistent problems or have questions about your condition that have not been fully answered at this time.  Sincerely,  Hart Carwin MD  This letter has been electronically signed by your physician.  Appended Document: Patient Notice-Endo Biopsy Results letter mailed

## 2010-09-19 ENCOUNTER — Other Ambulatory Visit (INDEPENDENT_AMBULATORY_CARE_PROVIDER_SITE_OTHER): Payer: BC Managed Care – PPO

## 2010-09-19 ENCOUNTER — Other Ambulatory Visit: Payer: Self-pay | Admitting: Endocrinology

## 2010-09-19 DIAGNOSIS — Z0000 Encounter for general adult medical examination without abnormal findings: Secondary | ICD-10-CM

## 2010-09-19 LAB — CBC WITH DIFFERENTIAL/PLATELET
Basophils Relative: 0.9 % (ref 0.0–3.0)
Eosinophils Absolute: 0.1 10*3/uL (ref 0.0–0.7)
Eosinophils Relative: 1.4 % (ref 0.0–5.0)
Hemoglobin: 12.4 g/dL (ref 12.0–15.0)
Lymphocytes Relative: 40.4 % (ref 12.0–46.0)
MCHC: 32.3 g/dL (ref 30.0–36.0)
MCV: 75.5 fl — ABNORMAL LOW (ref 78.0–100.0)
Monocytes Absolute: 0.7 10*3/uL (ref 0.1–1.0)
Neutro Abs: 3 10*3/uL (ref 1.4–7.7)
RBC: 5.07 Mil/uL (ref 3.87–5.11)
WBC: 6.4 10*3/uL (ref 4.5–10.5)

## 2010-09-19 LAB — LIPID PANEL
Cholesterol: 193 mg/dL (ref 0–200)
HDL: 61.1 mg/dL (ref >39.00)
LDL Cholesterol: 114 mg/dL — ABNORMAL HIGH (ref 0–99)
Total CHOL/HDL Ratio: 3
Triglycerides: 88 mg/dL (ref 0.0–149.0)
VLDL: 17.6 mg/dL (ref 0.0–40.0)

## 2010-09-19 LAB — BASIC METABOLIC PANEL
CO2: 28 mEq/L (ref 19–32)
Calcium: 9.2 mg/dL (ref 8.4–10.5)
Chloride: 104 mEq/L (ref 96–112)
Creatinine, Ser: 0.7 mg/dL (ref 0.4–1.2)
GFR: 112.51 mL/min (ref >60.00)
Potassium: 4 mEq/L (ref 3.5–5.1)
Sodium: 139 mEq/L (ref 135–145)

## 2010-09-19 LAB — HEPATIC FUNCTION PANEL
ALT: 24 U/L (ref 0–35)
AST: 25 U/L (ref 0–37)
Total Protein: 7.5 g/dL (ref 6.0–8.3)

## 2010-09-19 LAB — URINALYSIS
Hgb urine dipstick: NEGATIVE
Ketones, ur: NEGATIVE
Nitrite: NEGATIVE
Total Protein, Urine: NEGATIVE
Urine Glucose: NEGATIVE
Urobilinogen, UA: 0.2 (ref 0.0–1.0)
pH: 5.5 (ref 5.0–8.0)

## 2010-09-26 ENCOUNTER — Ambulatory Visit (INDEPENDENT_AMBULATORY_CARE_PROVIDER_SITE_OTHER): Payer: BC Managed Care – PPO | Admitting: Endocrinology

## 2010-09-26 ENCOUNTER — Encounter: Payer: Self-pay | Admitting: Endocrinology

## 2010-09-26 DIAGNOSIS — Z0000 Encounter for general adult medical examination without abnormal findings: Secondary | ICD-10-CM

## 2010-09-26 DIAGNOSIS — E785 Hyperlipidemia, unspecified: Secondary | ICD-10-CM

## 2010-09-26 MED ORDER — ATORVASTATIN CALCIUM 10 MG PO TABS: 10.0000 mg | ORAL_TABLET | Freq: Every day | ORAL | Status: DC

## 2010-09-26 NOTE — Progress Notes (Signed)
Subjective:    Patient ID: Brenda [name], female    DOB: May 15, 1950, 60 y.o.   MRN: [id number]  HPI here for regular wellness examination.  she's feeling pretty well in general, and says chronic med probs are stable, except as noted below Past Medical History  Diagnosis Date  . THYROID NODULE, RIGHT 05/11/2008  . DM 09/18/2007  . HEARING LOSS 09/15/2009  . OSTEOPENIA 04/20/2007  . HYPERLIPIDEMIA 04/25/2010  . ABDOMINAL/PELVIC SWELLING MASS/LUMP UNSPEC SITE 09/18/2007  . Palpitations 03/13/2008  . ACUTE URIS OF UNSPECIFIED SITE 06/06/2007  . ASTHMATIC BRONCHITIS, ACUTE 05/11/2008  . Glucose intolerance (impaired glucose tolerance)     Past Surgical History  Procedure Date  . Knee surgery 1991    Left  . Left knee cyst 1985  . Hernia repair 1992  . Lymph node removal   . Dilation and curettage, diagnostic / therapeutic     History   Social History  . Marital Status: Married    Spouse Name: N/A    Number of Children: N/A  . Years of Education: N/A   Occupational History  . [employer]    Social History Main Topics  . Smoking status: Former Games developer  . Smokeless tobacco: Not on file  . Alcohol Use: Yes  . Drug Use:   . Sexually Active:    Other Topics Concern  . Not on file   Social History Narrative  . No narrative on file    Current Outpatient Prescriptions on File Prior to Visit  Medication Sig Dispense Refill  . omeprazole (PRILOSEC) 20 MG capsule Take 20 mg by mouth daily.        Marland Kitchen DISCONTD: pravastatin (PRAVACHOL) 40 MG tablet 2 tablets by mouth at bedtime       . hydrOXYzine (ATARAX) 25 MG tablet Take 25 mg by mouth every 4 (four) hours as needed. For itching       . hyoscyamine (LEVSIN, ANASPAZ) 0.125 MG tablet Take 0.125 mg by mouth every 4 (four) hours as needed. For abdominal pain         Allergies  Allergen Reactions  . Hydrocodone     REACTION: hives    Family History  Problem Relation Age of Onset  . Cancer Mother     Breast Cancer  . Cancer  Father     "Stomach" Cancer  . Cancer Sister     Colon Cancer  . Kidney disease Brother     Kidney Transplant    BP 112/78  Pulse 98  Temp(Src) 98.5 F (36.9 C) (Oral)  Ht 5\' 6"  (1.676 m)  Wt 158 lb 6.4 oz (71.85 kg)  BMI 25.57 kg/m2  SpO2 98%     Review of Systems  Constitutional: Positive for unexpected weight change. Negative for fever.  HENT: Negative for hearing loss.   Eyes: Negative for visual disturbance.  Respiratory: Negative for shortness of breath.   Gastrointestinal: Negative for abdominal pain and blood in stool.  Genitourinary: Negative for hematuria.  Musculoskeletal: Negative for arthralgias.  Skin: Negative for rash.  Neurological: Negative for syncope.  Hematological: Bruises/bleeds easily.  Psychiatric/Behavioral: Negative for dysphoric mood. The patient is not nervous/anxious.       Objective:   Physical Exam VS: see vs page GEN: no distress HEAD: head: no deformity eyes: no periorbital swelling, no proptosis external nose and ears are normal mouth: no lesion seen NECK: supple, thyroid is not enlarged, except there might be minimal fullness at the right lobe.  i do  not appreciate a nodule CHEST WALL: no deformity BREASTS:  sees gyn ABD: abdomen is soft, nontender.  no hepatosplenomegaly.  not distended.  no hernia GENITALIA:  sees gyn RECTAL: sees gyn MUSCULOSKELETAL: muscle bulk and strength are grossly normal.  no obvious joint swelling.  gait is normal and steady EXTEMITIES: no deformity.  no ulcer on the left foot.  Left foot is of normal color and temp.  no edema NEURO:  cn 2-12 grossly intact.   readily moves all 4's.  sensation is intact to touch on the feet SKIN:  Normal texture and temperature.  No rash or suspicious lesion is visible.   NODES:  None palpable at the neck PSYCH: alert, oriented x3.  Does not appear anxious nor depressed.    Assessment & Plan:  Wellness visit today, with problems stable, except as noted.  SEPARATE  EVALUATION FOLLOWS--EACH PROBLEM HERE IS NEW, NOT RESPONDING TO TREATMENT, OR POSES SIGNIFICANT RISK TO THE PATIENT'S HEALTH: HISTORY OF THE PRESENT ILLNESS: Pt says she takes the pravachol as rx'ed, and tolerates it well. PAST MEDICAL HISTORY reviewed and up to date today REVIEW OF SYSTEMS: Denies chest pain PHYSICAL EXAMINATION: Pulses: dorsalis pedis intact bilat.  no carotid bruit HEART: Regular rate and rhythm without murmurs noted. Normal S1,S2.   See vs page LAB/XRAY RESULTS: Lab Results  Component Value Date   CHOL 193 09/19/2010   CHOL 237* 09/13/2009   CHOL 183 07/20/2008   Lab Results  Component Value Date   HDL 61.10 09/19/2010   HDL 16.10 09/13/2009   HDL 49.80 07/20/2008   Lab Results  Component Value Date   LDLCALC 114* 09/19/2010   LDLCALC 101* 07/20/2008   LDLCALC 82 06/25/2007   Lab Results  Component Value Date   TRIG 88.0 09/19/2010   TRIG 144.0 09/13/2009   TRIG 160.0* 07/20/2008   Lab Results  Component Value Date   CHOLHDL 3 09/19/2010   CHOLHDL 4 09/13/2009   CHOLHDL 4 07/20/2008   Lab Results  Component Value Date   LDLDIRECT 151.5 09/13/2009   IMPRESSION: Dyslipidemia, needs increased rx PLAN: See instruction page

## 2010-09-26 NOTE — Patient Instructions (Addendum)
Change pravastatin to lipitor 10 mg at bedtime. please consider these measures for your health:  minimize alcohol.  do not use tobacco products.  have a colonoscopy at least every 10 years from age 58.  keep firearms safely stored.  always use seat belts.  have working smoke alarms in your home.  see an eye doctor and dentist regularly.  never drive under the influence of alcohol or drugs (including prescription drugs).  Please return in 1 year. please let me know what your wishes would be, if artificial life support measures should become necessary.  it is critically important to prevent falling down (keep floor areas well-lit, dry, and free of loose objects). (we discussed code status.  pt requests full code, but would not want to be started or maintained on artificial life-support measures if there was not a reasonable chance of recovery).

## 2010-09-28 DIAGNOSIS — Z0000 Encounter for general adult medical examination without abnormal findings: Secondary | ICD-10-CM | POA: Insufficient documentation

## 2011-08-18 ENCOUNTER — Other Ambulatory Visit: Payer: Self-pay | Admitting: *Deleted

## 2011-08-18 ENCOUNTER — Other Ambulatory Visit (INDEPENDENT_AMBULATORY_CARE_PROVIDER_SITE_OTHER): Payer: BC Managed Care – PPO

## 2011-08-18 DIAGNOSIS — Z0000 Encounter for general adult medical examination without abnormal findings: Secondary | ICD-10-CM

## 2011-08-18 LAB — URINALYSIS, ROUTINE W REFLEX MICROSCOPIC
Bilirubin Urine: NEGATIVE
Ketones, ur: NEGATIVE
Nitrite: NEGATIVE
Urobilinogen, UA: 0.2 (ref 0.0–1.0)
pH: 6 (ref 5.0–8.0)

## 2011-08-18 LAB — BASIC METABOLIC PANEL
CO2: 28 mEq/L (ref 19–32)
Calcium: 9 mg/dL (ref 8.4–10.5)
Chloride: 106 mEq/L (ref 96–112)
Sodium: 140 mEq/L (ref 135–145)

## 2011-08-18 LAB — CBC WITH DIFFERENTIAL/PLATELET
Basophils Absolute: 0.1 10*3/uL (ref 0.0–0.1)
Eosinophils Absolute: 0.1 10*3/uL (ref 0.0–0.7)
Lymphocytes Relative: 37.3 % (ref 12.0–46.0)
MCHC: 31.1 g/dL (ref 30.0–36.0)
Monocytes Relative: 10.6 % (ref 3.0–12.0)
Platelets: 215 10*3/uL (ref 150.0–400.0)
RDW: 15.6 % — ABNORMAL HIGH (ref 11.5–14.6)

## 2011-08-18 LAB — TSH: TSH: 1.24 u[IU]/mL (ref 0.35–5.50)

## 2011-08-18 LAB — LIPID PANEL
Cholesterol: 130 mg/dL (ref 0–200)
HDL: 52.1 mg/dL (ref >39.00)
LDL Cholesterol: 62 mg/dL (ref 0–99)
Total CHOL/HDL Ratio: 2
Triglycerides: 78 mg/dL (ref 0.0–149.0)

## 2011-08-18 LAB — HEPATIC FUNCTION PANEL
AST: 19 U/L (ref 0–37)
Albumin: 3.7 g/dL (ref 3.5–5.2)
Alkaline Phosphatase: 55 U/L (ref 39–117)
Total Protein: 6.5 g/dL (ref 6.0–8.3)

## 2011-08-22 ENCOUNTER — Encounter: Payer: Self-pay | Admitting: Endocrinology

## 2011-08-22 ENCOUNTER — Ambulatory Visit (INDEPENDENT_AMBULATORY_CARE_PROVIDER_SITE_OTHER): Payer: BC Managed Care – PPO | Admitting: Endocrinology

## 2011-08-22 VITALS — BP 118/82 | HR 88 | Temp 97.8°F | Ht 66.0 in | Wt 163.0 lb

## 2011-08-22 DIAGNOSIS — Z0000 Encounter for general adult medical examination without abnormal findings: Secondary | ICD-10-CM

## 2011-08-22 DIAGNOSIS — E785 Hyperlipidemia, unspecified: Secondary | ICD-10-CM

## 2011-08-22 DIAGNOSIS — L909 Atrophic disorder of skin, unspecified: Secondary | ICD-10-CM

## 2011-08-22 DIAGNOSIS — N951 Menopausal and female climacteric states: Secondary | ICD-10-CM

## 2011-08-22 NOTE — Patient Instructions (Addendum)
please consider these measures for your health:  minimize alcohol.  do not use tobacco products.  have a colonoscopy at least every 10 years from age 59.  Women should have an annual mammogram from age 40.  keep firearms safely stored.  always use seat belts.  have working smoke alarms in your home.  see an eye doctor and dentist regularly.  never drive under the influence of alcohol or drugs (including prescription drugs).   Please return in 1 year. 

## 2011-08-22 NOTE — Progress Notes (Signed)
Subjective:    Patient ID: Brenda [name], female    DOB: 1950/07/26, 61 y.o.   MRN: [id number]  HPI here for regular wellness examination.  He's feeling pretty well in general, and says chronic med probs are stable, except as noted below.   Past Medical History  Diagnosis Date  . THYROID NODULE, RIGHT 05/11/2008  . DM 09/18/2007  . HEARING LOSS 09/15/2009  . OSTEOPENIA 04/20/2007  . HYPERLIPIDEMIA 04/25/2010  . ABDOMINAL/PELVIC SWELLING MASS/LUMP UNSPEC SITE 09/18/2007  . Palpitations 03/13/2008  . ACUTE URIS OF UNSPECIFIED SITE 06/06/2007  . ASTHMATIC BRONCHITIS, ACUTE 05/11/2008  . Glucose intolerance (impaired glucose tolerance)     Past Surgical History  Procedure Date  . Knee surgery 1991    Left  . Left knee cyst 1985  . Hernia repair 1992  . Lymph node removal   . Dilation and curettage, diagnostic / therapeutic     History   Social History  . Marital Status: Married    Spouse Name: N/A    Number of Children: N/A  . Years of Education: N/A   Occupational History  . [employer]    Social History Main Topics  . Smoking status: Former Games developer  . Smokeless tobacco: Not on file  . Alcohol Use: Yes  . Drug Use:   . Sexually Active:    Other Topics Concern  . Not on file   Social History Narrative  . No narrative on file    Current Outpatient Prescriptions on File Prior to Visit  Medication Sig Dispense Refill  . atorvastatin (LIPITOR) 10 MG tablet Take 1 tablet (10 mg total) by mouth daily.  90 tablet  3  . hydrOXYzine (ATARAX) 25 MG tablet Take 25 mg by mouth every 4 (four) hours as needed. For itching       . hyoscyamine (LEVSIN, ANASPAZ) 0.125 MG tablet Take 0.125 mg by mouth every 4 (four) hours as needed. For abdominal pain       . omeprazole (PRILOSEC) 20 MG capsule Take 20 mg by mouth daily.          Allergies  Allergen Reactions  . Hydrocodone     REACTION: hives    Family History  Problem Relation Age of Onset  . Cancer Mother     Breast  Cancer  . Cancer Father     "Stomach" Cancer  . Cancer Sister     Colon Cancer  . Kidney disease Brother     Kidney Transplant    BP 118/82  Pulse 88  Temp(Src) 97.8 F (36.6 C) (Oral)  Ht 5\' 6"  (1.676 m)  Wt 163 lb (73.936 kg)  BMI 26.31 kg/m2  SpO2 97%    Review of Systems  Constitutional: Negative for fever and unexpected weight change.  HENT:       No change in chronic mild hearing loss  Eyes: Negative for visual disturbance.  Respiratory: Negative for shortness of breath.   Cardiovascular: Negative for chest pain.  Gastrointestinal: Negative for anal bleeding.  Genitourinary: Negative for hematuria.  Musculoskeletal:       Temporary low-back pain, as she recently moved her son  Skin: Negative for rash.  Neurological: Negative for syncope and numbness.  Hematological: Does not bruise/bleed easily.  Psychiatric/Behavioral: Positive for suicidal ideas. Negative for dysphoric mood.      Objective:   Physical Exam VS: see vs page GEN: no distress HEAD: head: no deformity eyes: no periorbital swelling, no proptosis external nose and ears are  normal mouth: no lesion seen NECK: supple, thyroid is not enlarged CHEST WALL: no deformity LUNGS:  Clear to auscultation BREASTS: sees gyn CV: reg rate and rhythm, no murmur ABD: abdomen is soft, nontender.  no hepatosplenomegaly.  not distended.  no hernia GENITALIA/RECTAL: sees gyn MUSCULOSKELETAL: muscle bulk and strength are grossly normal.  no obvious joint swelling.  gait is normal and steady EXTEMITIES: no deformity.  no ulcer on the feet.  feet are of normal color and temp.  no edema PULSES: dorsalis pedis intact bilat.  no carotid bruit NEURO:  cn 2-12 grossly intact.   readily moves all 4's.  sensation is intact to touch on the feet SKIN:  Normal texture and temperature.  No rash or suspicious lesion is visible.   NODES:  None palpable at the neck PSYCH: alert, oriented x3.  Does not appear anxious nor  depressed.    Lab Results  Component Value Date   WBC 6.6 08/18/2011   HGB 12.3 08/18/2011   HCT 39.7 08/18/2011   PLT 215.0 08/18/2011   GLUCOSE 105* 08/18/2011   CHOL 130 08/18/2011   TRIG 78.0 08/18/2011   HDL 52.10 08/18/2011   LDLDIRECT 151.5 09/13/2009   LDLCALC 62 08/18/2011   ALT 20 08/18/2011   AST 19 08/18/2011   NA 140 08/18/2011   K 4.2 08/18/2011   CL 106 08/18/2011   CREATININE 0.6 08/18/2011   BUN 16 08/18/2011   CO2 28 08/18/2011   TSH 1.24 08/18/2011   HGBA1C 6.2 05/27/2009      Assessment & Plan:  Wellness visit today, with problems stable, except as noted.  5 skin tags removed/destroyed, from neck and axillae

## 2011-11-22 ENCOUNTER — Other Ambulatory Visit: Payer: Self-pay | Admitting: Obstetrics and Gynecology

## 2011-11-22 DIAGNOSIS — N63 Unspecified lump in unspecified breast: Secondary | ICD-10-CM

## 2011-11-24 ENCOUNTER — Other Ambulatory Visit: Payer: Self-pay

## 2011-11-24 MED ORDER — HYDROXYZINE HCL 25 MG PO TABS: 25.0000 mg | ORAL_TABLET | ORAL | Status: DC | PRN

## 2011-11-24 MED ORDER — ATORVASTATIN CALCIUM 10 MG PO TABS: 10.0000 mg | ORAL_TABLET | Freq: Every day | ORAL | Status: DC

## 2011-11-29 ENCOUNTER — Ambulatory Visit
Admission: RE | Admit: 2011-11-29 | Discharge: 2011-11-29 | Disposition: A | Discharge status: Home / Self Care | Payer: BC Managed Care – PPO | Source: Ambulatory Visit | Attending: Obstetrics and Gynecology | Admitting: Obstetrics and Gynecology

## 2011-11-29 DIAGNOSIS — N63 Unspecified lump in unspecified breast: Secondary | ICD-10-CM

## 2012-02-05 ENCOUNTER — Telehealth: Payer: Self-pay | Admitting: Endocrinology

## 2012-02-05 DIAGNOSIS — M79673 Pain in unspecified foot: Secondary | ICD-10-CM

## 2012-02-05 NOTE — Telephone Encounter (Signed)
i need to know the reason, for the referral

## 2012-02-05 NOTE — Telephone Encounter (Signed)
done

## 2012-02-05 NOTE — Telephone Encounter (Signed)
Pt would like a referral for foot pain

## 2012-02-05 NOTE — Telephone Encounter (Signed)
Pt would like to be referred to the ortho she saw in 2009. She could not remember the name but she says they are on [facility]. Please call and advise.

## 2012-02-29 ENCOUNTER — Other Ambulatory Visit: Payer: Self-pay | Admitting: Family Medicine

## 2012-02-29 DIAGNOSIS — M79672 Pain in left foot: Secondary | ICD-10-CM

## 2012-03-01 ENCOUNTER — Ambulatory Visit
Admission: RE | Admit: 2012-03-01 | Discharge: 2012-03-01 | Disposition: A | Discharge status: Home / Self Care | Payer: BC Managed Care – PPO | Source: Ambulatory Visit | Attending: Family Medicine | Admitting: Family Medicine

## 2012-03-01 DIAGNOSIS — M79672 Pain in left foot: Secondary | ICD-10-CM

## 2012-03-01 MED ORDER — GADOBENATE DIMEGLUMINE 529 MG/ML IV SOLN
7.0000 mL | Freq: Once | INTRAVENOUS | Status: AC | PRN
  Administered 2012-03-01: 7 mL via INTRAVENOUS

## 2012-05-20 ENCOUNTER — Ambulatory Visit (INDEPENDENT_AMBULATORY_CARE_PROVIDER_SITE_OTHER): Payer: BC Managed Care – PPO | Admitting: Endocrinology

## 2012-05-20 VITALS — BP 120/70 | HR 110 | Temp 100.0°F | Wt 175.0 lb

## 2012-05-20 DIAGNOSIS — J069 Acute upper respiratory infection, unspecified: Secondary | ICD-10-CM

## 2012-05-20 MED ORDER — PROMETHAZINE-DM 6.25-15 MG/5ML PO SYRP: 5.0000 mL | ORAL_SOLUTION | Freq: Four times a day (QID) | ORAL | Status: DC | PRN

## 2012-05-20 MED ORDER — CEFUROXIME AXETIL 250 MG PO TABS: 250.0000 mg | ORAL_TABLET | Freq: Two times a day (BID) | ORAL | Status: AC

## 2012-05-20 NOTE — Progress Notes (Signed)
  Subjective:    Patient ID: Brenda [name], female    DOB: [birth year], 62 y.o.   MRN: [id number]  HPI Pt states 1 week of slight prod-quality cough in the chest, and assoc bilateral maxillary pain. Past Medical History  Diagnosis Date  . THYROID NODULE, RIGHT 05/11/2008  . DM 09/18/2007  . HEARING LOSS 09/15/2009  . OSTEOPENIA 04/20/2007  . HYPERLIPIDEMIA 04/25/2010  . ABDOMINAL/PELVIC SWELLING MASS/LUMP UNSPEC SITE 09/18/2007  . Palpitations 03/13/2008  . ACUTE URIS OF UNSPECIFIED SITE 06/06/2007  . ASTHMATIC BRONCHITIS, ACUTE 05/11/2008  . Glucose intolerance (impaired glucose tolerance)     Past Surgical History  Procedure Laterality Date  . Knee surgery  1991    Left  . Left knee cyst  1985  . Hernia repair  1992  . Lymph node removal    . Dilation and curettage, diagnostic / therapeutic      History   Social History  . Marital Status: Married    Spouse Name: N/A    Number of Children: N/A  . Years of Education: N/A   Occupational History  . [employer]    Social History Main Topics  . Smoking status: Former Games developer  . Smokeless tobacco: Not on file  . Alcohol Use: Yes  . Drug Use:   . Sexually Active:    Other Topics Concern  . Not on file   Social History Narrative  . No narrative on file    Current Outpatient Prescriptions on File Prior to Visit  Medication Sig Dispense Refill  . atorvastatin (LIPITOR) 10 MG tablet Take 1 tablet (10 mg total) by mouth daily.  90 tablet  3  . hydrOXYzine (ATARAX/VISTARIL) 25 MG tablet Take 1 tablet (25 mg total) by mouth every 4 (four) hours as needed. For itching  90 tablet  3  . hyoscyamine (LEVSIN, ANASPAZ) 0.125 MG tablet Take 0.125 mg by mouth every 4 (four) hours as needed. For abdominal pain       . omeprazole (PRILOSEC) 20 MG capsule Take 20 mg by mouth daily.         No current facility-administered medications on file prior to visit.    Allergies  Allergen Reactions  . Hydrocodone     REACTION: "makes me high"     Family History  Problem Relation Age of Onset  . Cancer Mother     Breast Cancer  . Cancer Father     "Stomach" Cancer  . Cancer Sister     Colon Cancer  . Kidney disease Brother     Kidney Transplant    BP 120/70  Pulse 110  Temp(Src) 100 F (37.8 C) (Oral)  Wt 175 lb (79.379 kg)  BMI 28.26 kg/m2  SpO2 93%    Review of Systems She has slow-grade fever, sore throat, and bilat earache.    Objective:   Physical Exam VITAL SIGNS:  See vs page GENERAL: no distress head: no deformity eyes: no periorbital swelling, no proptosis external nose and ears are normal mouth: no lesion seen, but the pharynx is red LUNGS:  Clear to auscultation        Assessment & Plan:  URI, new

## 2012-05-20 NOTE — Patient Instructions (Addendum)
i have sent 2 prescriptions to your pharmacy, for an antibiotic pill, and cough syrup. I hope you feel better soon.  If you don't feel better by next week, please call back.    Loratadine (non-prescription) will help your runny nose.

## 2012-07-30 ENCOUNTER — Telehealth: Payer: Self-pay | Admitting: Endocrinology

## 2012-07-30 MED ORDER — ATORVASTATIN CALCIUM 10 MG PO TABS: 10.0000 mg | ORAL_TABLET | Freq: Every day | ORAL | Status: DC

## 2012-07-30 NOTE — Telephone Encounter (Signed)
Needs refill on Lipitor w/ [facility] / Sherri S.

## 2012-08-23 ENCOUNTER — Other Ambulatory Visit (INDEPENDENT_AMBULATORY_CARE_PROVIDER_SITE_OTHER): Payer: BC Managed Care – PPO

## 2012-08-23 ENCOUNTER — Other Ambulatory Visit: Payer: Self-pay

## 2012-08-23 DIAGNOSIS — Z0000 Encounter for general adult medical examination without abnormal findings: Secondary | ICD-10-CM

## 2012-08-23 LAB — URINALYSIS, ROUTINE W REFLEX MICROSCOPIC
Bilirubin Urine: NEGATIVE
Leukocytes, UA: NEGATIVE
Nitrite: NEGATIVE
Specific Gravity, Urine: >=1.03 (ref 1.000–1.030)
pH: 6 (ref 5.0–8.0)

## 2012-08-23 LAB — CBC WITH DIFFERENTIAL/PLATELET
Basophils Relative: 0.7 % (ref 0.0–3.0)
Eosinophils Absolute: 0.1 10*3/uL (ref 0.0–0.7)
Hemoglobin: 13.1 g/dL (ref 12.0–15.0)
Lymphocytes Relative: 36.1 % (ref 12.0–46.0)
MCHC: 32.2 g/dL (ref 30.0–36.0)
Monocytes Relative: 11 % (ref 3.0–12.0)
Neutro Abs: 3.4 10*3/uL (ref 1.4–7.7)
RBC: 5.57 Mil/uL — ABNORMAL HIGH (ref 3.87–5.11)

## 2012-08-23 LAB — BASIC METABOLIC PANEL
BUN: 21 mg/dL (ref 6–23)
Calcium: 9.1 mg/dL (ref 8.4–10.5)
Creatinine, Ser: 0.7 mg/dL (ref 0.4–1.2)
GFR: 111.76 mL/min (ref >60.00)
Glucose, Bld: 95 mg/dL (ref 70–99)

## 2012-08-23 LAB — HEPATIC FUNCTION PANEL
AST: 17 U/L (ref 0–37)
Albumin: 4 g/dL (ref 3.5–5.2)
Alkaline Phosphatase: 53 U/L (ref 39–117)
Total Protein: 6.7 g/dL (ref 6.0–8.3)

## 2012-08-23 LAB — TSH: TSH: 0.96 u[IU]/mL (ref 0.35–5.50)

## 2012-08-23 LAB — LIPID PANEL: Total CHOL/HDL Ratio: 3

## 2012-08-27 ENCOUNTER — Ambulatory Visit (INDEPENDENT_AMBULATORY_CARE_PROVIDER_SITE_OTHER): Payer: BC Managed Care – PPO | Admitting: Endocrinology

## 2012-08-27 VITALS — BP 126/78 | HR 78 | Ht 66.0 in | Wt 162.0 lb

## 2012-08-27 DIAGNOSIS — R05 Cough: Secondary | ICD-10-CM

## 2012-08-27 DIAGNOSIS — N951 Menopausal and female climacteric states: Secondary | ICD-10-CM

## 2012-08-27 DIAGNOSIS — R059 Cough, unspecified: Secondary | ICD-10-CM

## 2012-08-27 DIAGNOSIS — Z0000 Encounter for general adult medical examination without abnormal findings: Secondary | ICD-10-CM

## 2012-08-27 NOTE — Patient Instructions (Addendum)
please consider these measures for your health:  minimize alcohol.  do not use tobacco products.  have a colonoscopy at least every 10 years from age 60.  Women should have an annual mammogram from age 73.  keep firearms safely stored.  always use seat belts.  have working smoke alarms in your home.  see an eye doctor and dentist regularly.  never drive under the influence of alcohol or drugs (including prescription drugs).  those with fair skin should take precautions against the sun.   it is critically important to prevent falling down (keep floor areas well-lit, dry, and free of loose objects.  If you have a cane, walker, or wheelchair, you should use it, even for short trips around the house.  Also, try not to rush).   Let's check a chest-x-ray downstairs.  We'll contact you with results.  Loratadine-d (non-prescription) may help your cough. Please return in 1 year.

## 2012-08-27 NOTE — Progress Notes (Signed)
Subjective:    Patient ID: Brenda [name], female    DOB: 24-Jun-1950, 62 y.o.   MRN: [id number]  HPI Pt is here for regular wellness examination, and is feeling pretty well in general, and says chronic med probs are stable, except as noted below Past Medical History  Diagnosis Date  . THYROID NODULE, RIGHT 05/11/2008  . DM 09/18/2007  . HEARING LOSS 09/15/2009  . OSTEOPENIA 04/20/2007  . HYPERLIPIDEMIA 04/25/2010  . ABDOMINAL/PELVIC SWELLING MASS/LUMP UNSPEC SITE 09/18/2007  . Palpitations 03/13/2008  . ACUTE URIS OF UNSPECIFIED SITE 06/06/2007  . ASTHMATIC BRONCHITIS, ACUTE 05/11/2008  . Glucose intolerance (impaired glucose tolerance)     Past Surgical History  Procedure Laterality Date  . Knee surgery  1991    Left  . Left knee cyst  1985  . Hernia repair  1992  . Lymph node removal    . Dilation and curettage, diagnostic / therapeutic      History   Social History  . Marital Status: Married    Spouse Name: N/A    Number of Children: N/A  . Years of Education: N/A   Occupational History  . [employer]    Social History Main Topics  . Smoking status: Former Games developer  . Smokeless tobacco: Not on file  . Alcohol Use: Yes  . Drug Use:   . Sexually Active:    Other Topics Concern  . Not on file   Social History Narrative  . No narrative on file    Current Outpatient Prescriptions on File Prior to Visit  Medication Sig Dispense Refill  . atorvastatin (LIPITOR) 10 MG tablet Take 1 tablet (10 mg total) by mouth daily.  90 tablet  3  . hydrOXYzine (ATARAX/VISTARIL) 25 MG tablet Take 1 tablet (25 mg total) by mouth every 4 (four) hours as needed. For itching  90 tablet  3  . hyoscyamine (LEVSIN, ANASPAZ) 0.125 MG tablet Take 0.125 mg by mouth every 4 (four) hours as needed. For abdominal pain       . omeprazole (PRILOSEC) 20 MG capsule Take 20 mg by mouth daily.        . promethazine-dextromethorphan (PROMETHAZINE-DM) 6.25-15 MG/5ML syrup Take 5 mLs by mouth 4 (four) times  daily as needed for cough.  240 mL  1   No current facility-administered medications on file prior to visit.    Allergies  Allergen Reactions  . Hydrocodone     REACTION: "makes me high"    Family History  Problem Relation Age of Onset  . Cancer Mother     Breast Cancer  . Cancer Father     "Stomach" Cancer  . Cancer Sister     Colon Cancer  . Kidney disease Brother     Kidney Transplant    There were no vitals taken for this visit.     Review of Systems  Constitutional: Negative for unexpected weight change.  HENT: Negative for hearing loss.   Eyes: Negative for visual disturbance.  Respiratory: Negative for wheezing.   Cardiovascular: Negative for chest pain.  Gastrointestinal: Negative for anal bleeding.  Endocrine: Negative for cold intolerance.  Genitourinary: Negative for hematuria.  Musculoskeletal: Negative for back pain.  Skin: Negative for rash.  Allergic/Immunologic: Negative for environmental allergies.  Neurological: Negative for syncope and numbness.  Hematological: Does not bruise/bleed easily.  Psychiatric/Behavioral: Negative for dysphoric mood.       Objective:   Physical Exam VS: see vs page GEN: no distress HEAD: head: no deformity  eyes: no periorbital swelling, no proptosis external nose and ears are normal mouth: no lesion seen NECK: supple, thyroid is not enlarged CHEST WALL: no deformity BREASTS:  sees gyn CV: reg rate and rhythm, no murmur ABD: abdomen is soft, nontender.  no hepatosplenomegaly.  not distended.  no hernia GENITALIA/RECTAL: sees gyn MUSCULOSKELETAL: muscle bulk and strength are grossly normal.  no obvious joint swelling.  gait is normal and steady EXTEMITIES: no deformity.  no ulcer on the feet.  feet are of normal color and temp.  no edema PULSES: dorsalis pedis intact bilat.  no carotid bruit NEURO:  cn 2-12 grossly intact.   readily moves all 4's.  sensation is intact to touch on the feet SKIN:  Normal texture  and temperature.  No rash or suspicious lesion is visible.   NODES:  None palpable at the neck PSYCH: alert, oriented x3.  Does not appear anxious nor depressed.        Assessment & Plan:  Wellness visit today, with problems stable, except as noted. we discussed code status.  pt requests full code, but would not want to be started or maintained on artificial life-support measures if there was not a reasonable chance of recovery    SEPARATE EVALUATION FOLLOWS--EACH PROBLEM HERE IS NEW, NOT RESPONDING TO TREATMENT, OR POSES SIGNIFICANT RISK TO THE PATIENT'S HEALTH: HISTORY OF THE PRESENT ILLNESS: Pt states few mos of slight dry-quality cough in the throat and chest, but no assoc fever.  She saw ENT yesterday.  PAST MEDICAL HISTORY reviewed and up to date today REVIEW OF SYSTEMS: Denies sob, but she also has slight lightheadedness.   PHYSICAL EXAMINATION: VITAL SIGNS:  See vs page GENERAL: no distress Both eac's and tm's are normal LUNGS:  Clear to auscultation IMPRESSION: Cough, new, possibly due to allergic rhinitis. PLAN:  See instruction page flonase would be best option to try, but pt declines.

## 2012-12-12 ENCOUNTER — Ambulatory Visit (INDEPENDENT_AMBULATORY_CARE_PROVIDER_SITE_OTHER): Payer: BC Managed Care – PPO | Admitting: Endocrinology

## 2012-12-12 ENCOUNTER — Encounter: Payer: Self-pay | Admitting: Endocrinology

## 2012-12-12 VITALS — BP 124/72 | HR 62 | Ht 66.0 in | Wt 164.0 lb

## 2012-12-12 DIAGNOSIS — M25562 Pain in left knee: Secondary | ICD-10-CM

## 2012-12-12 DIAGNOSIS — M25569 Pain in unspecified knee: Secondary | ICD-10-CM

## 2012-12-12 LAB — URIC ACID: Uric Acid, Serum: 6 mg/dL (ref 2.4–7.0)

## 2012-12-12 MED ORDER — HYDROXYZINE HCL 25 MG PO TABS: 25.0000 mg | ORAL_TABLET | ORAL | Status: DC | PRN

## 2012-12-12 NOTE — Patient Instructions (Addendum)
blood tests are being requested for you today.  We'll contact you with results. Let's check the circulation test.  you will receive a phone call, about a day and time for an appointment. Refer to dr Althea Charon.  you will receive a phone call, about a day and time for an appointment

## 2012-12-12 NOTE — Progress Notes (Signed)
Subjective:    Patient ID: Brenda [name], female    DOB: [birth year], 62 y.o.   MRN: [id number]  HPI Pt states 4 days of slight pain at the medial aspect of the left leg, and assoc slight numbness.  She is unable to cite precip factor such as local injury.   Past Medical History  Diagnosis Date  . THYROID NODULE, RIGHT 05/11/2008  . DM 09/18/2007  . HEARING LOSS 09/15/2009  . OSTEOPENIA 04/20/2007  . HYPERLIPIDEMIA 04/25/2010  . ABDOMINAL/PELVIC SWELLING MASS/LUMP UNSPEC SITE 09/18/2007  . Palpitations 03/13/2008  . ACUTE URIS OF UNSPECIFIED SITE 06/06/2007  . ASTHMATIC BRONCHITIS, ACUTE 05/11/2008  . Glucose intolerance (impaired glucose tolerance)     Past Surgical History  Procedure Laterality Date  . Knee surgery  1991    Left  . Left knee cyst  1985  . Hernia repair  1992  . Lymph node removal    . Dilation and curettage, diagnostic / therapeutic      History   Social History  . Marital Status: Married    Spouse Name: N/A    Number of Children: N/A  . Years of Education: N/A   Occupational History  . [employer]    Social History Main Topics  . Smoking status: Former Games developer  . Smokeless tobacco: Not on file  . Alcohol Use: Yes  . Drug Use:   . Sexual Activity:    Other Topics Concern  . Not on file   Social History Narrative  . No narrative on file    Current Outpatient Prescriptions on File Prior to Visit  Medication Sig Dispense Refill  . atorvastatin (LIPITOR) 10 MG tablet Take 1 tablet (10 mg total) by mouth daily.  90 tablet  3  . hyoscyamine (LEVSIN, ANASPAZ) 0.125 MG tablet Take 0.125 mg by mouth every 4 (four) hours as needed. For abdominal pain       . omeprazole (PRILOSEC) 20 MG capsule Take 20 mg by mouth daily.        . promethazine-dextromethorphan (PROMETHAZINE-DM) 6.25-15 MG/5ML syrup Take 5 mLs by mouth 4 (four) times daily as needed for cough.  240 mL  1   No current facility-administered medications on file prior to visit.    Allergies   Allergen Reactions  . Hydrocodone     REACTION: "makes me high"    Family History  Problem Relation Age of Onset  . Cancer Mother     Breast Cancer  . Cancer Father     "Stomach" Cancer  . Cancer Sister     Colon Cancer  . Kidney disease Brother     Kidney Transplant    BP 124/72  Pulse 62  Ht 5\' 6"  (1.676 m)  Wt 164 lb (74.39 kg)  BMI 26.48 kg/m2  SpO2 98%    Review of Systems Denies fever and sob.  She was stung by an insect on the right foot.  It is itchy, but atarax helps.      Objective:   Physical Exam VITAL SIGNS:  See vs page. GENERAL: no distress. Left leg: no swelling/warmth/erythems, but the medial aspect is slightly tender.   Gait: normal and painless. Pulses: dorsalis pedis intact bilat.   Feet: no deformity. feet are of normal color and temp.  no edema.  There is an insect bite on the dorsal aspect of the right foot, but no erythema. Neuro: sensation is intact to touch on the feet.  Lab Results  Component Value Date  ESRSEDRATE 6 12/12/2012       Assessment & Plan:  Leg pain, new, uncertain etiology Pruritis, well-controlled, despite recent insect bite.

## 2012-12-17 ENCOUNTER — Telehealth: Payer: Self-pay | Admitting: Endocrinology

## 2012-12-17 NOTE — Telephone Encounter (Signed)
Pt advised pcc from [facility] will call with appt date and time for circulation test

## 2013-04-18 ENCOUNTER — Ambulatory Visit (INDEPENDENT_AMBULATORY_CARE_PROVIDER_SITE_OTHER): Payer: BC Managed Care – PPO | Admitting: Endocrinology

## 2013-04-18 ENCOUNTER — Encounter: Payer: Self-pay | Admitting: Endocrinology

## 2013-04-18 VITALS — BP 118/72 | HR 96 | Ht 66.0 in | Wt 167.0 lb

## 2013-04-18 DIAGNOSIS — R21 Rash and other nonspecific skin eruption: Secondary | ICD-10-CM

## 2013-04-18 MED ORDER — CLOTRIMAZOLE-BETAMETHASONE 1-0.05 % EX LOTN: TOPICAL_LOTION | CUTANEOUS | Status: DC

## 2013-04-18 NOTE — Patient Instructions (Signed)
i have sent a prescription to your pharmacy, for a skin cream, to help the rash.

## 2013-04-18 NOTE — Progress Notes (Signed)
   Subjective:    Patient ID: Brenda [name], female    DOB: 1950-05-23, 63 y.o.   MRN: [id number]  HPI Pt states few mos of moderate itching of the left leg, and assoc slight rash.  She has minimal improvement with lamisil.   Past Medical History  Diagnosis Date  . THYROID NODULE, RIGHT 05/11/2008  . DM 09/18/2007  . HEARING LOSS 09/15/2009  . OSTEOPENIA 04/20/2007  . HYPERLIPIDEMIA 04/25/2010  . ABDOMINAL/PELVIC SWELLING MASS/LUMP UNSPEC SITE 09/18/2007  . Palpitations 03/13/2008  . ACUTE URIS OF UNSPECIFIED SITE 06/06/2007  . ASTHMATIC BRONCHITIS, ACUTE 05/11/2008  . Glucose intolerance (impaired glucose tolerance)     Past Surgical History  Procedure Laterality Date  . Knee surgery  1991    Left  . Left knee cyst  1985  . Hernia repair  1992  . Lymph node removal    . Dilation and curettage, diagnostic / therapeutic      History   Social History  . Marital Status: Married    Spouse Name: N/A    Number of Children: N/A  . Years of Education: N/A   Occupational History  . [place] History Main Topics  . Smoking status: Former Research scientist (life sciences)  . Smokeless tobacco: Not on file  . Alcohol Use: Yes  . Drug Use:   . Sexual Activity:    Other Topics Concern  . Not on file   Social History Narrative  . No narrative on file    Current Outpatient Prescriptions on File Prior to Visit  Medication Sig Dispense Refill  . atorvastatin (LIPITOR) 10 MG tablet Take 1 tablet (10 mg total) by mouth daily.  90 tablet  3  . hydrOXYzine (ATARAX/VISTARIL) 25 MG tablet Take 1 tablet (25 mg total) by mouth every 4 (four) hours as needed. For itching  90 tablet  3  . hyoscyamine (LEVSIN, ANASPAZ) 0.125 MG tablet Take 0.125 mg by mouth every 4 (four) hours as needed. For abdominal pain       . omeprazole (PRILOSEC) 20 MG capsule Take 20 mg by mouth daily.         No current facility-administered medications on file prior to visit.    Allergies  Allergen Reactions  . Hydrocodone       REACTION: "makes me high"    Family History  Problem Relation Age of Onset  . Cancer Mother     Breast Cancer  . Cancer Father     "Stomach" Cancer  . Cancer Sister     Colon Cancer  . Kidney disease Brother     Kidney Transplant    BP 118/72  Pulse 96  Ht 5\' 6"  (1.676 m)  Wt 167 lb (75.751 kg)  BMI 26.97 kg/m2  SpO2 97%   Review of Systems She also has a rash on the right hand.  Denies fever.      Objective:   Physical Exam VITAL SIGNS:  See vs page GENERAL: no distress Mild eczematous rash on the dorsal aspect of the right hand, and left leg.        Assessment & Plan:  Rash, new, uncertain etiology.  Dyshidrosis is most likely.

## 2013-07-29 ENCOUNTER — Other Ambulatory Visit: Payer: Self-pay

## 2013-07-29 MED ORDER — ATORVASTATIN CALCIUM 10 MG PO TABS: 10.0000 mg | ORAL_TABLET | Freq: Every day | ORAL | Status: DC

## 2013-09-16 ENCOUNTER — Other Ambulatory Visit: Payer: Self-pay

## 2013-09-16 ENCOUNTER — Other Ambulatory Visit (INDEPENDENT_AMBULATORY_CARE_PROVIDER_SITE_OTHER): Payer: BC Managed Care – PPO

## 2013-09-16 DIAGNOSIS — Z0000 Encounter for general adult medical examination without abnormal findings: Secondary | ICD-10-CM

## 2013-09-16 LAB — BASIC METABOLIC PANEL
BUN: 21 mg/dL (ref 6–23)
CO2: 22 mEq/L (ref 19–32)
Calcium: 9 mg/dL (ref 8.4–10.5)
Chloride: 108 mEq/L (ref 96–112)
Creatinine, Ser: 0.7 mg/dL (ref 0.4–1.2)
GFR: 119.31 mL/min (ref >60.00)
Glucose, Bld: 106 mg/dL — ABNORMAL HIGH (ref 70–99)
Potassium: 4.1 mEq/L (ref 3.5–5.1)
Sodium: 139 mEq/L (ref 135–145)

## 2013-09-16 LAB — MICROALBUMIN / CREATININE URINE RATIO
Creatinine,U: 157.2 mg/dL
MICROALB UR: 1.9 mg/dL (ref 0.0–1.9)
MICROALB/CREAT RATIO: 1.2 mg/g (ref 0.0–30.0)

## 2013-09-16 LAB — LIPID PANEL
Cholesterol: 131 mg/dL (ref 0–200)
HDL: 43.3 mg/dL (ref >39.00)
LDL Cholesterol: 68 mg/dL (ref 0–99)
NONHDL: 87.7
Total CHOL/HDL Ratio: 3
Triglycerides: 101 mg/dL (ref 0.0–149.0)
VLDL: 20.2 mg/dL (ref 0.0–40.0)

## 2013-09-16 LAB — HEMOGLOBIN A1C: HEMOGLOBIN A1C: 6.3 % (ref 4.6–6.5)

## 2013-09-16 LAB — CBC WITH DIFFERENTIAL/PLATELET
BASOS PCT: 0.8 % (ref 0.0–3.0)
Basophils Absolute: 0 10*3/uL (ref 0.0–0.1)
Eosinophils Absolute: 0.2 10*3/uL (ref 0.0–0.7)
Eosinophils Relative: 3.5 % (ref 0.0–5.0)
HCT: 40 % (ref 36.0–46.0)
Hemoglobin: 12.6 g/dL (ref 12.0–15.0)
Lymphocytes Relative: 36.3 % (ref 12.0–46.0)
Lymphs Abs: 1.9 10*3/uL (ref 0.7–4.0)
MCHC: 31.4 g/dL (ref 30.0–36.0)
MCV: 73.6 fl — ABNORMAL LOW (ref 78.0–100.0)
Monocytes Absolute: 0.6 10*3/uL (ref 0.1–1.0)
Monocytes Relative: 11.2 % (ref 3.0–12.0)
NEUTROS ABS: 2.5 10*3/uL (ref 1.4–7.7)
Neutrophils Relative %: 48.2 % (ref 43.0–77.0)
Platelets: 226 10*3/uL (ref 150.0–400.0)
RBC: 5.44 Mil/uL — AB (ref 3.87–5.11)
RDW: 15.7 % — AB (ref 11.5–15.5)
WBC: 5.3 10*3/uL (ref 4.0–10.5)

## 2013-09-16 LAB — TSH: TSH: 0.89 u[IU]/mL (ref 0.35–4.50)

## 2013-09-17 LAB — HEPATIC FUNCTION PANEL (6)
ALBUMIN: 4.3 g/dL (ref 3.6–4.8)
ALK PHOS: 56 IU/L (ref 39–117)
ALT: 20 IU/L (ref 0–32)
AST: 18 IU/L (ref 0–40)
BILIRUBIN TOTAL: 0.3 mg/dL (ref 0.0–1.2)
Bilirubin, Direct: 0.07 mg/dL (ref 0.00–0.40)

## 2013-09-19 ENCOUNTER — Ambulatory Visit (INDEPENDENT_AMBULATORY_CARE_PROVIDER_SITE_OTHER): Payer: BC Managed Care – PPO | Admitting: Endocrinology

## 2013-09-19 ENCOUNTER — Encounter: Payer: Self-pay | Admitting: Endocrinology

## 2013-09-19 VITALS — BP 132/90 | HR 82 | Temp 98.5°F | Ht 66.0 in | Wt 165.0 lb

## 2013-09-19 DIAGNOSIS — Z0000 Encounter for general adult medical examination without abnormal findings: Secondary | ICD-10-CM

## 2013-09-19 DIAGNOSIS — Z2911 Encounter for prophylactic immunotherapy for respiratory syncytial virus (RSV): Secondary | ICD-10-CM

## 2013-09-19 DIAGNOSIS — L299 Pruritus, unspecified: Secondary | ICD-10-CM

## 2013-09-19 DIAGNOSIS — Z23 Encounter for immunization: Secondary | ICD-10-CM

## 2013-09-19 MED ORDER — ATORVASTATIN CALCIUM 10 MG PO TABS: 10.0000 mg | ORAL_TABLET | Freq: Every day | ORAL | Status: DC

## 2013-09-19 NOTE — Progress Notes (Signed)
Subjective:    Patient ID: Brenda [name], female    DOB: Oct 16, 1950, 63 y.o.   MRN: [id number]  HPI Pt is here for regular wellness examination, and is feeling pretty well in general, and says chronic med probs are stable, except as noted below Past Medical History  Diagnosis Date  . THYROID NODULE, RIGHT 05/11/2008  . DM 09/18/2007  . HEARING LOSS 09/15/2009  . OSTEOPENIA 04/20/2007  . HYPERLIPIDEMIA 04/25/2010  . ABDOMINAL/PELVIC SWELLING MASS/LUMP UNSPEC SITE 09/18/2007  . Palpitations 03/13/2008  . ACUTE URIS OF UNSPECIFIED SITE 06/06/2007  . ASTHMATIC BRONCHITIS, ACUTE 05/11/2008  . Glucose intolerance (impaired glucose tolerance)     Past Surgical History  Procedure Laterality Date  . Knee surgery  1991    Left  . Left knee cyst  1985  . Hernia repair  1992  . Lymph node removal    . Dilation and curettage, diagnostic / therapeutic      History   Social History  . Marital Status: Married    Spouse Name: N/A    Number of Children: N/A  . Years of Education: N/A   Occupational History  . [place] History Main Topics  . Smoking status: Former Research scientist (life sciences)  . Smokeless tobacco: Not on file  . Alcohol Use: Yes  . Drug Use:   . Sexual Activity:    Other Topics Concern  . Not on file   Social History Narrative  . No narrative on file    Current Outpatient Prescriptions on File Prior to Visit  Medication Sig Dispense Refill  . clotrimazole-betamethasone (LOTRISONE) lotion 3 times a day, as needed for rash  30 mL  2  . hydrOXYzine (ATARAX/VISTARIL) 25 MG tablet Take 1 tablet (25 mg total) by mouth every 4 (four) hours as needed. For itching  90 tablet  3  . hyoscyamine (LEVSIN, ANASPAZ) 0.125 MG tablet Take 0.125 mg by mouth every 4 (four) hours as needed. For abdominal pain       . omeprazole (PRILOSEC) 20 MG capsule Take 20 mg by mouth daily.         No current facility-administered medications on file prior to visit.    Allergies  Allergen Reactions   . Hydrocodone     REACTION: "makes me high"    Family History  Problem Relation Age of Onset  . Cancer Mother     Breast Cancer  . Cancer Father     "Stomach" Cancer  . Cancer Sister     Colon Cancer  . Kidney disease Brother     Kidney Transplant    BP 132/90  Pulse 82  Temp(Src) 98.5 F (36.9 C) (Oral)  Ht 5\' 6"  (1.676 m)  Wt 165 lb (74.844 kg)  BMI 26.64 kg/m2  SpO2 95%     Review of Systems  Constitutional: Negative for unexpected weight change.  HENT: Negative for hearing loss.   Eyes: Negative for visual disturbance.  Respiratory: Negative for shortness of breath.   Cardiovascular: Negative for chest pain.  Gastrointestinal: Negative for anal bleeding.  Endocrine: Negative for cold intolerance.  Genitourinary: Negative for hematuria.  Musculoskeletal: Negative for back pain.  Skin: Negative for rash.  Allergic/Immunologic: Negative for environmental allergies.  Neurological: Negative for syncope.  Hematological: Bruises/bleeds easily.  Psychiatric/Behavioral: Negative for dysphoric mood.       Objective:   Physical Exam VS: see vs page GEN: no distress HEAD: head: no deformity eyes: no periorbital swelling, no proptosis  external nose and ears are normal mouth: no lesion seen NECK: supple, thyroid is not enlarged CHEST WALL: no deformity LUNGS:  Clear to auscultation BREASTS:  sees gyn CV: reg rate and rhythm, no murmur ABD: abdomen is soft, nontender.  no hepatosplenomegaly.  not distended.  no hernia GENITALIA/RECTAL: sees gyn MUSCULOSKELETAL: muscle bulk and strength are grossly normal.  no obvious joint swelling.  gait is normal and steady EXTEMITIES: no deformity.  no ulcer on the feet.  feet are of normal color and temp.  no edema PULSES: dorsalis pedis intact bilat.  no carotid bruit NEURO:  cn 2-12 grossly intact.   readily moves all 4's.  sensation is intact to touch on the feet.  NODES:  None palpable at the neck PSYCH: alert,  well-oriented.  Does not appear anxious nor depressed.     Assessment & Plan:  Wellness visit today, with problems stable, except as noted. please consider these measures for your health:  minimize alcohol.  do not use tobacco products.  have a colonoscopy at least every 10 years from age 76.  Women should have an annual mammogram from age 80.  keep firearms safely stored.  always use seat belts.  have working smoke alarms in your home.  see an eye doctor and dentist regularly.  never drive under the influence of alcohol or drugs (including prescription drugs).  those with fair skin should take precautions against the sun.     SEPARATE EVALUATION FOLLOWS--EACH PROBLEM HERE IS NEW, NOT RESPONDING TO TREATMENT, OR POSES SIGNIFICANT RISK TO THE PATIENT'S HEALTH: HISTORY OF THE PRESENT ILLNESS: Pt states few years of slight rash throughout the body, and assoc itching PAST MEDICAL HISTORY reviewed and up to date today REVIEW OF SYSTEMS: Denies fever PHYSICAL EXAMINATION: VITAL SIGNS:  See vs page GENERAL: no distress LAB/XRAY RESULTS: Lab Results  Component Value Date   TSH 0.89 09/16/2013  Pruritis, persistent IMPRESSION: PLAN: refer to dermatology

## 2013-09-19 NOTE — Patient Instructions (Addendum)
Refer to a dermatology specialist.  you will receive a phone call, about a day and time for an appointment.  please consider these measures for your health:  minimize alcohol.  do not use tobacco products.  have a colonoscopy at least every 10 years from age 61.  Women should have an annual mammogram from age 47.  keep firearms safely stored.  always use seat belts.  have working smoke alarms in your home.  see an eye doctor and dentist regularly.  never drive under the influence of alcohol or drugs (including prescription drugs).

## 2013-10-21 ENCOUNTER — Emergency Department (HOSPITAL_COMMUNITY)
Admission: EM | Admit: 2013-10-21 | Discharge: 2013-10-21 | Disposition: A | Discharge status: Home / Self Care | Payer: BC Managed Care – PPO | Attending: Emergency Medicine | Admitting: Emergency Medicine

## 2013-10-21 ENCOUNTER — Emergency Department (HOSPITAL_COMMUNITY): Payer: BC Managed Care – PPO

## 2013-10-21 ENCOUNTER — Encounter (HOSPITAL_COMMUNITY): Payer: Self-pay | Admitting: Emergency Medicine

## 2013-10-21 DIAGNOSIS — Y9389 Activity, other specified: Secondary | ICD-10-CM | POA: Insufficient documentation

## 2013-10-21 DIAGNOSIS — Z79899 Other long term (current) drug therapy: Secondary | ICD-10-CM | POA: Insufficient documentation

## 2013-10-21 DIAGNOSIS — S139XXA Sprain of joints and ligaments of unspecified parts of neck, initial encounter: Secondary | ICD-10-CM | POA: Insufficient documentation

## 2013-10-21 DIAGNOSIS — Z87891 Personal history of nicotine dependence: Secondary | ICD-10-CM | POA: Insufficient documentation

## 2013-10-21 DIAGNOSIS — Z8669 Personal history of other diseases of the nervous system and sense organs: Secondary | ICD-10-CM | POA: Diagnosis not present

## 2013-10-21 DIAGNOSIS — E785 Hyperlipidemia, unspecified: Secondary | ICD-10-CM | POA: Diagnosis not present

## 2013-10-21 DIAGNOSIS — E119 Type 2 diabetes mellitus without complications: Secondary | ICD-10-CM | POA: Diagnosis not present

## 2013-10-21 DIAGNOSIS — J45909 Unspecified asthma, uncomplicated: Secondary | ICD-10-CM | POA: Diagnosis not present

## 2013-10-21 DIAGNOSIS — Y92410 Unspecified street and highway as the place of occurrence of the external cause: Secondary | ICD-10-CM | POA: Diagnosis not present

## 2013-10-21 DIAGNOSIS — S134XXA Sprain of ligaments of cervical spine, initial encounter: Secondary | ICD-10-CM

## 2013-10-21 DIAGNOSIS — S060X0A Concussion without loss of consciousness, initial encounter: Secondary | ICD-10-CM

## 2013-10-21 DIAGNOSIS — S0990XA Unspecified injury of head, initial encounter: Secondary | ICD-10-CM | POA: Diagnosis present

## 2013-10-21 MED ORDER — NAPROXEN 500 MG PO TABS: 500.0000 mg | ORAL_TABLET | Freq: Two times a day (BID) | ORAL | Status: DC | PRN

## 2013-10-21 MED ORDER — METHOCARBAMOL 500 MG PO TABS: 500.0000 mg | ORAL_TABLET | Freq: Three times a day (TID) | ORAL | Status: DC | PRN

## 2013-10-21 MED ORDER — OXYCODONE-ACETAMINOPHEN 5-325 MG PO TABS
1.0000 | ORAL_TABLET | Freq: Once | ORAL | Status: AC
  Administered 2013-10-21: 1 via ORAL
  Filled 2013-10-21: qty 1

## 2013-10-21 MED ORDER — OXYCODONE-ACETAMINOPHEN 5-325 MG PO TABS: 1.0000 | ORAL_TABLET | Freq: Four times a day (QID) | ORAL | Status: DC | PRN
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
## 2013-10-21 NOTE — Discharge Instructions (Signed)
Take naprosyn as directed for inflammation and pain with percocet for breakthrough pain and robaxin for muscle relaxation. Do not drive or operate machinery with pain medication or muscle relaxation use. Ice to areas of soreness for the next few days and then may move to heat, no more than 20 minutes at a time for each. Expect to be sore for the next few day and follow up with orthopedic doctor for recheck of ongoing symptoms in 1 week, schedule an appointment tomorrow for 1 week from today that can be cancelled within 24-48hrs. Return to ER for emergent changing or worsening of symptoms.    Cervical Sprain A cervical sprain is an injury in the neck in which the strong, fibrous tissues (ligaments) that connect your neck bones stretch or tear. Cervical sprains can range from mild to severe. Severe cervical sprains can cause the neck vertebrae to be unstable. This can lead to damage of the spinal cord and can result in serious nervous system problems. The amount of time it takes for a cervical sprain to get better depends on the cause and extent of the injury. Most cervical sprains heal in 1 to 3 weeks. CAUSES  Severe cervical sprains may be caused by:   Contact sport injuries (such as from football, rugby, wrestling, hockey, auto racing, gymnastics, diving, martial arts, or boxing).   Motor vehicle collisions.   Whiplash injuries. This is an injury from a sudden forward and backward whipping movement of the head and neck.  Falls.  Mild cervical sprains may be caused by:   Being in an awkward position, such as while cradling a telephone between your ear and shoulder.   Sitting in a chair that does not offer proper support.   Working at a poorly Landscape architect station.   Looking up or down for long periods of time.  SYMPTOMS   Pain, soreness, stiffness, or a burning sensation in the front, back, or sides of the neck. This discomfort may develop immediately after the injury or  slowly, 24 hours or more after the injury.   Pain or tenderness directly in the middle of the back of the neck.   Shoulder or upper back pain.   Limited ability to move the neck.   Headache.   Dizziness.   Weakness, numbness, or tingling in the hands or arms.   Muscle spasms.   Difficulty swallowing or chewing.   Tenderness and swelling of the neck.  DIAGNOSIS  Most of the time your health care provider can diagnose a cervical sprain by taking your history and doing a physical exam. Your health care provider will ask about previous neck injuries and any known neck problems, such as arthritis in the neck. X-rays may be taken to find out if there are any other problems, such as with the bones of the neck. Other tests, such as a CT scan or MRI, may also be needed.  TREATMENT  Treatment depends on the severity of the cervical sprain. Mild sprains can be treated with rest, keeping the neck in place (immobilization), and pain medicines. Severe cervical sprains are immediately immobilized. Further treatment is done to help with pain, muscle spasms, and other symptoms and may include:  Medicines, such as pain relievers, numbing medicines, or muscle relaxants.   Physical therapy. This may involve stretching exercises, strengthening exercises, and posture training. Exercises and improved posture can help stabilize the neck, strengthen muscles, and help stop symptoms from returning.  HOME CARE INSTRUCTIONS   Put ice on  the injured area.   Put ice in a plastic bag.   Place a towel between your skin and the bag.   Leave the ice on for 15-20 minutes, 3-4 times a day.   If your injury was severe, you may have been given a cervical collar to wear. A cervical collar is a two-piece collar designed to keep your neck from moving while it heals.  Do not remove the collar unless instructed by your health care provider.  If you have long hair, keep it outside of the collar.  Ask  your health care provider before making any adjustments to your collar. Minor adjustments may be required over time to improve comfort and reduce pressure on your chin or on the back of your head.  Ifyou are allowed to remove the collar for cleaning or bathing, follow your health care provider's instructions on how to do so safely.  Keep your collar clean by wiping it with mild soap and water and drying it completely. If the collar you have been given includes removable pads, remove them every 1-2 days and hand wash them with soap and water. Allow them to air dry. They should be completely dry before you wear them in the collar.  If you are allowed to remove the collar for cleaning and bathing, wash and dry the skin of your neck. Check your skin for irritation or sores. If you see any, tell your health care provider.  Do not drive while wearing the collar.   Only take over-the-counter or prescription medicines for pain, discomfort, or fever as directed by your health care provider.   Keep all follow-up appointments as directed by your health care provider.   Keep all physical therapy appointments as directed by your health care provider.   Make any needed adjustments to your workstation to promote good posture.   Avoid positions and activities that make your symptoms worse.   Warm up and stretch before being active to help prevent problems.  SEEK MEDICAL CARE IF:   Your pain is not controlled with medicine.   You are unable to decrease your pain medicine over time as planned.   Your activity level is not improving as expected.  SEEK IMMEDIATE MEDICAL CARE IF:   You develop any bleeding.  You develop stomach upset.  You have signs of an allergic reaction to your medicine.   Your symptoms get worse.   You develop new, unexplained symptoms.   You have numbness, tingling, weakness, or paralysis in any part of your body.  MAKE SURE YOU:   Understand these  instructions.  Will watch your condition.  Will get help right away if you are not doing well or get worse. Document Released: 01/22/2007 Document Revised: 04/01/2013 Document Reviewed: 10/02/2012 [facility] Patient Information 2015 [place], [place]. This information is not intended to replace advice given to you by your health care provider. Make sure you discuss any questions you have with your health care provider.  Concussion A concussion, or closed-head injury, is a brain injury caused by a direct blow to the head or by a quick and sudden movement (jolt) of the head or neck. Concussions are usually not life-threatening. Even so, the effects of a concussion can be serious. If you have had a concussion before, you are more likely to experience concussion-like symptoms after a direct blow to the head.  CAUSES  Direct blow to the head, such as from running into another player during a soccer game, being hit in  a fight, or hitting your head on a hard surface.  A jolt of the head or neck that causes the brain to move back and forth inside the skull, such as in a car crash. SIGNS AND SYMPTOMS The signs of a concussion can be hard to notice. Early on, they may be missed by you, family members, and health care providers. You may look fine but act or feel differently. Symptoms are usually temporary, but they may last for days, weeks, or even longer. Some symptoms may appear right away while others may not show up for hours or days. Every head injury is different. Symptoms include:  Mild to moderate headaches that will not go away.  A feeling of pressure inside your head.  Having more trouble than usual:  Learning or remembering things you have heard.  Answering questions.  Paying attention or concentrating.  Organizing daily tasks.  Making decisions and solving problems.  Slowness in thinking, acting or reacting, speaking, or reading.  Getting lost or being easily confused.  Feeling  tired all the time or lacking energy (fatigued).  Feeling drowsy.  Sleep disturbances.  Sleeping more than usual.  Sleeping less than usual.  Trouble falling asleep.  Trouble sleeping (insomnia).  Loss of balance or feeling lightheaded or dizzy.  Nausea or vomiting.  Numbness or tingling.  Increased sensitivity to:  Sounds.  Lights.  Distractions.  Vision problems or eyes that tire easily.  Diminished sense of taste or smell.  Ringing in the ears.  Mood changes such as feeling sad or anxious.  Becoming easily irritated or angry for little or no reason.  Lack of motivation.  Seeing or hearing things other people do not see or hear (hallucinations). DIAGNOSIS Your health care provider can usually diagnose a concussion based on a description of your injury and symptoms. He or she will ask whether you passed out (lost consciousness) and whether you are having trouble remembering events that happened right before and during your injury. Your evaluation might include:  A brain scan to look for signs of injury to the brain. Even if the test shows no injury, you may still have a concussion.  Blood tests to be sure other problems are not present. TREATMENT  Concussions are usually treated in an emergency department, in urgent care, or at a clinic. You may need to stay in the hospital overnight for further treatment.  Tell your health care provider if you are taking any medicines, including prescription medicines, over-the-counter medicines, and natural remedies. Some medicines, such as blood thinners (anticoagulants) and aspirin, may increase the chance of complications. Also tell your health care provider whether you have had alcohol or are taking illegal drugs. This information may affect treatment.  Your health care provider will send you home with important instructions to follow.  How fast you will recover from a concussion depends on many factors. These factors  include how severe your concussion is, what part of your brain was injured, your age, and how healthy you were before the concussion.  Most people with mild injuries recover fully. Recovery can take time. In general, recovery is slower in older persons. Also, persons who have had a concussion in the past or have other medical problems may find that it takes longer to recover from their current injury. HOME CARE INSTRUCTIONS General Instructions  Carefully follow the directions your health care provider gave you.  Only take over-the-counter or prescription medicines for pain, discomfort, or fever as directed by your health  care provider.  Take only those medicines that your health care provider has approved.  Do not drink alcohol until your health care provider says you are well enough to do so. Alcohol and certain other drugs may slow your recovery and can put you at risk of further injury.  If it is harder than usual to remember things, write them down.  If you are easily distracted, try to do one thing at a time. For example, do not try to watch TV while fixing dinner.  Talk with family members or close friends when making important decisions.  Keep all follow-up appointments. Repeated evaluation of your symptoms is recommended for your recovery.  Watch your symptoms and tell others to do the same. Complications sometimes occur after a concussion. Older adults with a brain injury may have a higher risk of serious complications, such as a blood clot on the brain.  Tell your teachers, school nurse, school counselor, coach, athletic trainer, or work Freight forwarder about your injury, symptoms, and restrictions. Tell them about what you can or cannot do. They should watch for:  Increased problems with attention or concentration.  Increased difficulty remembering or learning new information.  Increased time needed to complete tasks or assignments.  Increased irritability or decreased ability to  cope with stress.  Increased symptoms.  Rest. Rest helps the brain to heal. Make sure you:  Get plenty of sleep at night. Avoid staying up late at night.  Keep the same bedtime hours on weekends and weekdays.  Rest during the day. Take daytime naps or rest breaks when you feel tired.  Limit activities that require a lot of thought or concentration. These include:  Doing homework or job-related work.  Watching TV.  Working on the computer.  Avoid any situation where there is potential for another head injury (football, hockey, soccer, basketball, martial arts, downhill snow sports and horseback riding). Your condition will get worse every time you experience a concussion. You should avoid these activities until you are evaluated by the appropriate follow-up health care providers. Returning To Your Regular Activities You will need to return to your normal activities slowly, not all at once. You must give your body and brain enough time for recovery.  Do not return to sports or other athletic activities until your health care provider tells you it is safe to do so.  Ask your health care provider when you can drive, ride a bicycle, or operate heavy machinery. Your ability to react may be slower after a brain injury. Never do these activities if you are dizzy.  Ask your health care provider about when you can return to work or school. Preventing Another Concussion It is very important to avoid another brain injury, especially before you have recovered. In rare cases, another injury can lead to permanent brain damage, brain swelling, or death. The risk of this is greatest during the first 7-10 days after a head injury. Avoid injuries by:  Wearing a seat belt when riding in a car.  Drinking alcohol only in moderation.  Wearing a helmet when biking, skiing, skateboarding, skating, or doing similar activities.  Avoiding activities that could lead to a second concussion, such as contact  or recreational sports, until your health care provider says it is okay.  Taking safety measures in your home.  Remove clutter and tripping hazards from floors and stairways.  Use grab bars in bathrooms and handrails by stairs.  Place non-slip mats on floors and in bathtubs.  Improve lighting  in dim areas. SEEK MEDICAL CARE IF:  You have increased problems paying attention or concentrating.  You have increased difficulty remembering or learning new information.  You need more time to complete tasks or assignments than before.  You have increased irritability or decreased ability to cope with stress.  You have more symptoms than before. Seek medical care if you have any of the following symptoms for more than 2 weeks after your injury:  Lasting (chronic) headaches.  Dizziness or balance problems.  Nausea.  Vision problems.  Increased sensitivity to noise or light.  Depression or mood swings.  Anxiety or irritability.  Memory problems.  Difficulty concentrating or paying attention.  Sleep problems.  Feeling tired all the time. SEEK IMMEDIATE MEDICAL CARE IF:  You have severe or worsening headaches. These may be a sign of a blood clot in the brain.  You have weakness (even if only in one hand, leg, or part of the face).  You have numbness.  You have decreased coordination.  You vomit repeatedly.  You have increased sleepiness.  One pupil is larger than the other.  You have convulsions.  You have slurred speech.  You have increased confusion. This may be a sign of a blood clot in the brain.  You have increased restlessness, agitation, or irritability.  You are unable to recognize people or places.  You have neck pain.  It is difficult to wake you up.  You have unusual behavior changes.  You lose consciousness. MAKE SURE YOU:  Understand these instructions.  Will watch your condition.  Will get help right away if you are not doing well or  get worse. Document Released: 06/17/2003 Document Revised: 04/01/2013 Document Reviewed: 10/17/2012 [facility] Patient Information 2015 [place], [place]. This information is not intended to replace advice given to you by your health care provider. Make sure you discuss any questions you have with your health care provider.  Post-Concussion Syndrome Post-concussion syndrome means you have problems after a head injury. The problems can last for weeks or months. The problems usually go away on their own over time. HOME CARE   Only take medicines as told by your doctor. Do not take aspirin.  Sleep with your head raised (elevated) to help with headaches.  Avoid activities that can cause another head injury.  Do not play contact sports like football, hockey, soccer or basketball. Do not do other risky activities like downhill skiing or martial arts, or ride horses until your doctor says it is OK.  Keep all doctor visits as told. GET HELP RIGHT AWAY IF:  You feel confused or very sleepy.  You cannot wake the injured person.  You feel sick to your stomach (nauseous) or keep throwing up (vomiting).  You feel like you are moving when you are not (vertigo).  You notice the injured person's eyes moving back and forth very fast.  You start shaking (convulsing) or pass out (faint).  You have very bad headaches that do not get better with medicine.  You cannot use your arms or legs normally.  The black centers of your eyes (pupils) change size.  You have clear or bloody fluid coming from your nose or ears.  Your problems get worse, not better. MAKE SURE YOU:  Understand these instructions.  Will watch your condition.  Will get help right away if you are not doing well or get worse. Document Released: 05/04/2004 Document Revised: 01/15/2013 Document Reviewed: 10/13/2010 [facility] Patient Information 2015 [place], [place]. This information is not  intended to replace advice given to you by  your health care provider. Make sure you discuss any questions you have with your health care provider.  Cryotherapy Cryotherapy means treatment with cold. Ice or gel packs can be used to reduce both pain and swelling. Ice is the most helpful within the first 24 to 48 hours after an injury or flareup from overusing a muscle or joint. Sprains, strains, spasms, burning pain, shooting pain, and aches can all be eased with ice. Ice can also be used when recovering from surgery. Ice is effective, has very few side effects, and is safe for most people to use. PRECAUTIONS  Ice is not a safe treatment option for people with:  Raynaud's phenomenon. This is a condition affecting small blood vessels in the extremities. Exposure to cold may cause your problems to return.  Cold hypersensitivity. There are many forms of cold hypersensitivity, including:  Cold urticaria. Red, itchy hives appear on the skin when the tissues begin to warm after being iced.  Cold erythema. This is a red, itchy rash caused by exposure to cold.  Cold hemoglobinuria. Red blood cells break down when the tissues begin to warm after being iced. The hemoglobin that carry oxygen are passed into the urine because they cannot combine with blood proteins fast enough.  Numbness or altered sensitivity in the area being iced. If you have any of the following conditions, do not use ice until you have discussed cryotherapy with your caregiver:  Heart conditions, such as arrhythmia, angina, or chronic heart disease.  High blood pressure.  Healing wounds or open skin in the area being iced.  Current infections.  Rheumatoid arthritis.  Poor circulation.  Diabetes. Ice slows the blood flow in the region it is applied. This is beneficial when trying to stop inflamed tissues from spreading irritating chemicals to surrounding tissues. However, if you expose your skin to cold temperatures for too long or without the proper protection, you  can damage your skin or nerves. Watch for signs of skin damage due to cold. HOME CARE INSTRUCTIONS Follow these tips to use ice and cold packs safely.  Place a dry or damp towel between the ice and skin. A damp towel will cool the skin more quickly, so you may need to shorten the time that the ice is used.  For a more rapid response, add gentle compression to the ice.  Ice for no more than 10 to 20 minutes at a time. The bonier the area you are icing, the less time it will take to get the benefits of ice.  Check your skin after 5 minutes to make sure there are no signs of a poor response to cold or skin damage.  Rest 20 minutes or more in between uses.  Once your skin is numb, you can end your treatment. You can test numbness by very lightly touching your skin. The touch should be so light that you do not see the skin dimple from the pressure of your fingertip. When using ice, most people will feel these normal sensations in this order: cold, burning, aching, and numbness.  Do not use ice on someone who cannot communicate their responses to pain, such as small children or people with dementia. HOW TO MAKE AN ICE PACK Ice packs are the most common way to use ice therapy. Other methods include ice massage, ice baths, and cryo-sprays. Muscle creams that cause a cold, tingly feeling do not offer the same benefits that ice offers and  should not be used as a substitute unless recommended by your caregiver. To make an ice pack, do one of the following:  Place crushed ice or a bag of frozen vegetables in a sealable plastic bag. Squeeze out the excess air. Place this bag inside another plastic bag. Slide the bag into a pillowcase or place a damp towel between your skin and the bag.  Mix 3 parts water with 1 part rubbing alcohol. Freeze the mixture in a sealable plastic bag. When you remove the mixture from the freezer, it will be slushy. Squeeze out the excess air. Place this bag inside another plastic  bag. Slide the bag into a pillowcase or place a damp towel between your skin and the bag. SEEK MEDICAL CARE IF:  You develop white spots on your skin. This may give the skin a blotchy (mottled) appearance.  Your skin turns [place] or pale.  Your skin becomes waxy or hard.  Your swelling gets worse. MAKE SURE YOU:   Understand these instructions.  Will watch your condition.  Will get help right away if you are not doing well or get worse. Document Released: 11/21/2010 Document Revised: 06/19/2011 Document Reviewed: 11/21/2010 [facility] Patient Information 2015 [place], [place]. This information is not intended to replace advice given to you by your health care provider. Make sure you discuss any questions you have with your health care provider.
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
## 2013-10-25 NOTE — ED Provider Notes (Signed)
Medical screening examination/treatment/procedure(s) were performed by non-physician practitioner and as supervising physician I was immediately available for consultation/collaboration.   EKG Interpretation None        Hoy Morn, MD 10/25/13 [phone]

## 2013-12-23 ENCOUNTER — Other Ambulatory Visit: Payer: Self-pay | Admitting: Obstetrics and Gynecology

## 2013-12-24 LAB — CYTOLOGY - PAP

## 2014-06-26 ENCOUNTER — Ambulatory Visit (INDEPENDENT_AMBULATORY_CARE_PROVIDER_SITE_OTHER): Payer: BC Managed Care – PPO | Admitting: Endocrinology

## 2014-06-26 ENCOUNTER — Encounter: Payer: Self-pay | Admitting: Endocrinology

## 2014-06-26 VITALS — BP 100/70 | HR 93 | Temp 97.6°F | Wt 169.8 lb

## 2014-06-26 DIAGNOSIS — E119 Type 2 diabetes mellitus without complications: Secondary | ICD-10-CM

## 2014-06-26 DIAGNOSIS — R21 Rash and other nonspecific skin eruption: Secondary | ICD-10-CM | POA: Diagnosis not present

## 2014-06-26 LAB — CBC WITH DIFFERENTIAL/PLATELET
BASOS ABS: 0.1 10*3/uL (ref 0.0–0.1)
Basophils Relative: 1 % (ref 0.0–3.0)
EOS ABS: 0.2 10*3/uL (ref 0.0–0.7)
Eosinophils Relative: 2.6 % (ref 0.0–5.0)
HCT: 39.6 % (ref 36.0–46.0)
HEMOGLOBIN: 12.9 g/dL (ref 12.0–15.0)
LYMPHS PCT: 37.4 % (ref 12.0–46.0)
Lymphs Abs: 2.3 10*3/uL (ref 0.7–4.0)
MCHC: 32.5 g/dL (ref 30.0–36.0)
MCV: 71.9 fl — ABNORMAL LOW (ref 78.0–100.0)
MONO ABS: 0.6 10*3/uL (ref 0.1–1.0)
Monocytes Relative: 9.9 % (ref 3.0–12.0)
NEUTROS PCT: 49.1 % (ref 43.0–77.0)
Neutro Abs: 3.1 10*3/uL (ref 1.4–7.7)
Platelets: 208 10*3/uL (ref 150.0–400.0)
RBC: 5.51 Mil/uL — ABNORMAL HIGH (ref 3.87–5.11)
RDW: 15.2 % (ref 11.5–15.5)
WBC: 6.3 10*3/uL (ref 4.0–10.5)

## 2014-06-26 LAB — HEMOGLOBIN A1C: Hgb A1c MFr Bld: 6.3 % (ref 4.6–6.5)

## 2014-06-26 LAB — SEDIMENTATION RATE: SED RATE: 4 mm/h (ref 0–22)

## 2014-06-26 MED ORDER — METHYLPREDNISOLONE (PAK) 4 MG PO TABS: ORAL_TABLET | ORAL | Status: DC

## 2014-06-26 MED ORDER — TRIAMCINOLONE ACETONIDE 0.1 % EX CREA: 1.0000 "application " | TOPICAL_CREAM | Freq: Four times a day (QID) | CUTANEOUS | Status: DC

## 2014-06-26 NOTE — Progress Notes (Signed)
Pre visit review using our clinic review tool, if applicable. No additional management support is needed unless otherwise documented below in the visit note. 

## 2014-06-26 NOTE — Progress Notes (Signed)
Subjective:    Patient ID: Brenda [name], female    DOB: [birth year], 64 y.o.   MRN: [id number]  HPI Pt states 1 week of severe recurrence of the rash she had last year.  It is throughout the body.  No assoc fever.  No help with hydroxyzine.  She saw derm last year, who prescribed a cream.  She has tried this over the past week, but it did not help.   Past Medical History  Diagnosis Date  . THYROID NODULE, RIGHT 05/11/2008  . DM 09/18/2007  . HEARING LOSS 09/15/2009  . OSTEOPENIA 04/20/2007  . HYPERLIPIDEMIA 04/25/2010  . ABDOMINAL/PELVIC SWELLING MASS/LUMP UNSPEC SITE 09/18/2007  . Palpitations 03/13/2008  . ACUTE URIS OF UNSPECIFIED SITE 06/06/2007  . ASTHMATIC BRONCHITIS, ACUTE 05/11/2008  . Glucose intolerance (impaired glucose tolerance)     Past Surgical History  Procedure Laterality Date  . Knee surgery  1991    Left  . Left knee cyst  1985  . Hernia repair  1992  . Lymph node removal    . Dilation and curettage, diagnostic / therapeutic      History   Social History  . Marital Status: Married    Spouse Name: N/A  . Number of Children: N/A  . Years of Education: N/A   Occupational History  . [place] History Main Topics  . Smoking status: Former Research scientist (life sciences)  . Smokeless tobacco: Not on file  . Alcohol Use: No  . Drug Use: Not on file  . Sexual Activity: Not on file   Other Topics Concern  . Not on file   Social History Narrative    Current Outpatient Prescriptions on File Prior to Visit  Medication Sig Dispense Refill  . atorvastatin (LIPITOR) 10 MG tablet Take 1 tablet (10 mg total) by mouth daily. 90 tablet 3  . clotrimazole-betamethasone (LOTRISONE) lotion 3 times a day, as needed for rash 30 mL 2  . hydrOXYzine (ATARAX/VISTARIL) 25 MG tablet Take 1 tablet (25 mg total) by mouth every 4 (four) hours as needed. For itching 90 tablet 3  . methocarbamol (ROBAXIN) 500 MG tablet Take 1 tablet (500 mg total) by mouth every 8 (eight) hours as needed for  muscle spasms. 15 tablet 0  . naproxen (NAPROSYN) 500 MG tablet Take 1 tablet (500 mg total) by mouth 2 (two) times daily as needed for mild pain, moderate pain or headache (TAKE WITH MEALS.). 20 tablet 0   No current facility-administered medications on file prior to visit.    Allergies  Allergen Reactions  . Hydrocodone     REACTION: "makes me high"    Family History  Problem Relation Age of Onset  . Cancer Mother     Breast Cancer  . Cancer Father     "Stomach" Cancer  . Cancer Sister     Colon Cancer  . Kidney disease Brother     Kidney Transplant    BP 100/70 mmHg  Pulse 93  Temp(Src) 97.6 F (36.4 C) (Oral)  Wt 169 lb 12.8 oz (77.021 kg)  SpO2 99%  Review of Systems Denies sore throat and hematuria.     Objective:   Physical Exam VITAL SIGNS:  See vs page.  GENERAL: no distress. Skin: minimal patchy red rash throughout the body.   Pulses: dorsalis pedis intact bilat.   MSK: no deformity of the feet CV: no leg edema Skin:  no ulcer on the feet.  normal color and temp on  the feet. Neuro: sensation is intact to touch on the feet  Lab Results  Component Value Date   HGBA1C 6.3 06/26/2014   ESR=normal    Assessment & Plan:  Rash, worse, uncertain etiology DM: well-controlled   Patient is advised the following: Patient Instructions  blood tests are being requested for you today.  We'll let you know about the results.   Please go back to the specialist.  you will receive a phone call, about a day and time for an appointment.   i have sent 2 prescriptions to your pharmacy: skin cream and a steroid "pack."

## 2014-06-26 NOTE — Patient Instructions (Addendum)
blood tests are being requested for you today.  We'll let you know about the results.   Please go back to the specialist.  you will receive a phone call, about a day and time for an appointment.   i have sent 2 prescriptions to your pharmacy: skin cream and a steroid "pack."

## 2014-06-29 ENCOUNTER — Telehealth: Payer: Self-pay | Admitting: Endocrinology

## 2014-06-29 NOTE — Telephone Encounter (Signed)
Contacted pt and advised labs from 06/27/2014 were normal. Pt voiced understanding.

## 2014-06-29 NOTE — Telephone Encounter (Signed)
Pt wants results from 56

## 2014-07-24 ENCOUNTER — Other Ambulatory Visit: Payer: Self-pay | Admitting: Podiatry

## 2014-07-24 DIAGNOSIS — I872 Venous insufficiency (chronic) (peripheral): Secondary | ICD-10-CM

## 2014-08-18 ENCOUNTER — Encounter: Payer: Self-pay | Admitting: Endocrinology

## 2014-08-18 ENCOUNTER — Ambulatory Visit (INDEPENDENT_AMBULATORY_CARE_PROVIDER_SITE_OTHER): Payer: BC Managed Care – PPO | Admitting: Endocrinology

## 2014-08-18 VITALS — BP 120/70 | HR 100 | Temp 98.6°F | Ht 66.0 in | Wt 167.0 lb

## 2014-08-18 DIAGNOSIS — J069 Acute upper respiratory infection, unspecified: Secondary | ICD-10-CM

## 2014-08-18 MED ORDER — CEFUROXIME AXETIL 250 MG PO TABS: 250.0000 mg | ORAL_TABLET | Freq: Two times a day (BID) | ORAL | Status: AC

## 2014-08-18 MED ORDER — BENZONATATE 100 MG PO CAPS: 100.0000 mg | ORAL_CAPSULE | Freq: Three times a day (TID) | ORAL | Status: DC | PRN

## 2014-08-18 NOTE — Patient Instructions (Signed)
i have sent 2 prescriptions to your pharmacy: for an antibiotic pill, and cough.  Loratadine-d (non-prescription) will help your congestion.  I hope you feel better soon.  If you don't feel better by next week, please call back.  Please call sooner if you get worse.

## 2014-08-18 NOTE — Progress Notes (Signed)
Subjective:    Patient ID: Brenda [name], female    DOB: 1950/06/21, 64 y.o.   MRN: [id number]  HPI Pt states 1 week of slight pain at the throat, and assoc nasal congestion Past Medical History  Diagnosis Date  . THYROID NODULE, RIGHT 05/11/2008  . DM 09/18/2007  . HEARING LOSS 09/15/2009  . OSTEOPENIA 04/20/2007  . HYPERLIPIDEMIA 04/25/2010  . ABDOMINAL/PELVIC SWELLING MASS/LUMP UNSPEC SITE 09/18/2007  . Palpitations 03/13/2008  . ACUTE URIS OF UNSPECIFIED SITE 06/06/2007  . ASTHMATIC BRONCHITIS, ACUTE 05/11/2008  . Glucose intolerance (impaired glucose tolerance)     Past Surgical History  Procedure Laterality Date  . Knee surgery  1991    Left  . Left knee cyst  1985  . Hernia repair  1992  . Lymph node removal    . Dilation and curettage, diagnostic / therapeutic      History   Social History  . Marital Status: Married    Spouse Name: N/A  . Number of Children: N/A  . Years of Education: N/A   Occupational History  . [place] History Main Topics  . Smoking status: Former Research scientist (life sciences)  . Smokeless tobacco: Not on file  . Alcohol Use: No  . Drug Use: Not on file  . Sexual Activity: Not on file   Other Topics Concern  . Not on file   Social History Narrative    Current Outpatient Prescriptions on File Prior to Visit  Medication Sig Dispense Refill  . atorvastatin (LIPITOR) 10 MG tablet Take 1 tablet (10 mg total) by mouth daily. 90 tablet 3  . clotrimazole-betamethasone (LOTRISONE) lotion 3 times a day, as needed for rash 30 mL 2  . naproxen (NAPROSYN) 500 MG tablet Take 1 tablet (500 mg total) by mouth 2 (two) times daily as needed for mild pain, moderate pain or headache (TAKE WITH MEALS.). 20 tablet 0  . triamcinolone cream (KENALOG) 0.1 % Apply 1 application topically 4 (four) times daily. As needed for rash or itching (Patient not taking: Reported on 08/18/2014) 80 g 2   No current facility-administered medications on file prior to visit.     Allergies  Allergen Reactions  . Hydrocodone     REACTION: "makes me high"    Family History  Problem Relation Age of Onset  . Cancer Mother     Breast Cancer  . Cancer Father     "Stomach" Cancer  . Cancer Sister     Colon Cancer  . Kidney disease Brother     Kidney Transplant    BP 120/70 mmHg  Pulse 100  Temp(Src) 98.6 F (37 C) (Oral)  Ht 5\' 6"  (1.676 m)  Wt 167 lb (75.751 kg)  BMI 26.97 kg/m2  SpO2 96%    Review of Systems She has slight dry cough, but no fever or earache.     Objective:   Physical Exam VITAL SIGNS:  See vs page GENERAL: no distress head: no deformity eyes: no periorbital swelling, no proptosis external nose and ears are normal mouth: no lesion seen Right ear is red, but the left is normal. LUNGS:  Clear to auscultation        Assessment & Plan:  URI: new  Patient is advised the following: Patient Instructions  i have sent 2 prescriptions to your pharmacy: for an antibiotic pill, and cough.  Loratadine-d (non-prescription) will help your congestion.  I hope you feel better soon.  If you don't feel better by  next week, please call back.  Please call sooner if you get worse.

## 2014-08-27 ENCOUNTER — Ambulatory Visit
Admission: RE | Admit: 2014-08-27 | Discharge: 2014-08-27 | Disposition: A | Discharge status: Home / Self Care | Payer: BC Managed Care – PPO | Source: Ambulatory Visit | Attending: Podiatry | Admitting: Podiatry

## 2014-08-27 ENCOUNTER — Other Ambulatory Visit: Payer: Self-pay | Admitting: Podiatry

## 2014-08-27 DIAGNOSIS — I872 Venous insufficiency (chronic) (peripheral): Secondary | ICD-10-CM

## 2014-08-27 NOTE — Consult Note (Signed)
Chief Complaint: Symptomatic right lower extremity edema.  Referring Physician(s): [place]  Primary Care Physician: Renato Shin  History of Present Illness: Brenda [name] is a 64 y.o. female with primary complaint of right lower extremity edema localizing to the foot and ankle region. She has some pain in her ankle when she walks. She is status post right hammertoe surgery in 2012 of the fourth and fifth toes by Dr. Fritzi Mandes. Dr. Fritzi Mandes initially prescribed knee-high graded compression garments to treat edema which she has been wearing every day for the last 3 weeks. She feels that these stockings have helped reduce some edema. She has not had any known bulging varicosities or ulcerations of the right lower extremity. She does state that she has some spider veins. She has never had any varicose vein treatment or other surgery to the lower extremity other than the hammertoe surgery. She denies any rest pain, claudication or bleeding. She denies any trauma to the extremity.   Past Medical History  Diagnosis Date  . THYROID NODULE, RIGHT 05/11/2008  . DM 09/18/2007  . HEARING LOSS 09/15/2009  . OSTEOPENIA 04/20/2007  . HYPERLIPIDEMIA 04/25/2010  . ABDOMINAL/PELVIC SWELLING MASS/LUMP UNSPEC SITE 09/18/2007  . Palpitations 03/13/2008  . ACUTE URIS OF UNSPECIFIED SITE 06/06/2007  . ASTHMATIC BRONCHITIS, ACUTE 05/11/2008  . Glucose intolerance (impaired glucose tolerance)     Past Surgical History  Procedure Laterality Date  . Knee surgery  1991    Left  . Left knee cyst  1985  . Hernia repair  1992  . Lymph node removal    . Dilation and curettage, diagnostic / therapeutic      Allergies: Hydrocodone  Medications: Prior to Admission medications   Medication Sig Start Date End Date Taking? Authorizing Provider  atorvastatin (LIPITOR) 10 MG tablet Take 1 tablet (10 mg total) by mouth daily. 09/19/13 09/19/14 Yes Renato Shin, MD  benzonatate (TESSALON) 100 MG capsule Take 1  capsule (100 mg total) by mouth 3 (three) times daily as needed for cough. 08/18/14  Yes Renato Shin, MD  clotrimazole-betamethasone (LOTRISONE) lotion 3 times a day, as needed for rash 04/18/13  Yes Renato Shin, MD  cefUROXime (CEFTIN) 250 MG tablet Take 1 tablet (250 mg total) by mouth 2 (two) times daily. Patient not taking: Reported on 08/27/2014 08/18/14 08/28/14  Renato Shin, MD  naproxen (NAPROSYN) 500 MG tablet Take 1 tablet (500 mg total) by mouth 2 (two) times daily as needed for mild pain, moderate pain or headache (TAKE WITH MEALS.). Patient not taking: Reported on 08/27/2014 10/21/13   Mercedes Camprubi-Soms, PA-C  triamcinolone cream (KENALOG) 0.1 % Apply 1 application topically 4 (four) times daily. As needed for rash or itching Patient not taking: Reported on 08/18/2014 06/26/14   Renato Shin, MD    Family History  Problem Relation Age of Onset  . Cancer Mother     Breast Cancer  . Cancer Father     "Stomach" Cancer  . Cancer Sister     Colon Cancer  . Kidney disease Brother     Kidney Transplant    History   Social History  . Marital Status: Married    Spouse Name: N/A  . Number of Children: N/A  . Years of Education: N/A   Occupational History  . [place] History Main Topics  . Smoking status: Former Research scientist (life sciences)  . Smokeless tobacco: Not on file  . Alcohol Use: No  . Drug Use: Not on file  .  Sexual Activity: Not on file   Other Topics Concern  . Not on file   Social History Narrative    Review of Systems: A 12 point ROS discussed and pertinent positives are indicated in the HPI above.  All other systems are negative.  Review of Systems  Constitutional: Negative.   Respiratory: Negative.   Cardiovascular: Negative.   Gastrointestinal: Negative.   Endocrine: Negative.   Genitourinary: Negative.   Musculoskeletal: Negative.   Skin: Positive for rash.       The patient is being treated at [facility] for a chronic body rash.    Neurological: Negative.      Vital Signs: BP 126/78 mmHg  Pulse 85  Temp(Src) 98.2 F (36.8 C)  Resp 16  Ht 5\' 6"  (1.676 m)  Wt 165 lb (74.844 kg)  BMI 26.64 kg/m2  SpO2 97%  Physical Exam  Constitutional: She is oriented to person, place, and time. She appears well-developed and well-nourished. No distress.  Musculoskeletal: Normal range of motion. She exhibits no tenderness.  Right lower extremity: Mild edema of the foot, especially along the lateral aspect of the foot. No palpable varicosities. No ulcerations. Mild telangiectasias along the lateral aspect of the knee. Normal palpable 2+ herself pedis and posterior tibial arterial pulses. Normal perfusion of the foot.  Neurological: She is alert and oriented to person, place, and time.  Skin: She is not diaphoretic.  Nursing note and vitals reviewed.   Imaging: US Venous Img Lower Unilateral Right  08/27/2014   CLINICAL DATA:  Right lower extremity edema and pain localizing to the ankle and foot. Workup for venous insufficiency.  EXAM: RIGHT LOWER EXTREMITY VENOUS DUPLEX ULTRASOUND  TECHNIQUE: Gray-scale sonography with graded compression, as well as color Doppler and duplex ultrasound, were performed to evaluate the deep and superficial veins of the lower extremity. Spectral Doppler was utilized to evaluate flow at rest and with distal augmentation maneuvers. I personally performed the technical portion of the exam.  COMPARISON:  None.  FINDINGS: Deep venous evaluation shows normal patency and flow within the common femoral vein, femoral vein, popliteal vein and tibial veins. There is no evidence of deep venous reflux. Veins augment normally and show normal compressibility.  Superficial venous evaluation was performed in a standing upright position. The great saphenous vein is of normal caliber and shows normal compressibility without evidence of thrombus. Maximal vein caliber is 5 mm in the proximal thigh. The GSV is widely patent and  demonstrates no evidence of insufficiency with augmentation. The saphenous femoral junction is competent and shows no insufficiency.  The short saphenous vein is also normally patent and shows no evidence of reflux or insufficiency. No true saphenopopliteal junction is present. There are no perforator veins identified or communicating varicosities with the saphenous system.  IMPRESSION: Normal deep venous and superficial venous evaluation of the right lower extremity demonstrating no evidence of venous insufficiency. No incompetent perforator veins or communicating varicosities are demonstrated. The great saphenous and short saphenous veins are competent and show no evidence of reflux.   Electronically Signed   By: Aletta Edouard M.D.   On: 08/27/2014 10:55    Labs:  CBC:  Recent Labs  09/16/13 0903 06/26/14 1022  WBC 5.3 6.3  HGB 12.6 12.9  HCT 40.0 39.6  PLT 226.0 208.0    COAGS: No results for input(s): INR, APTT in the last 8760 hours.  BMP:  Recent Labs  09/16/13 0903  NA 139  K 4.1  CL 108  CO2 22  GLUCOSE 106*  BUN 21  CALCIUM 9.0  CREATININE 0.7    LIVER FUNCTION TESTS:  Recent Labs  09/16/13 0903  BILITOT 0.3  AST 18  ALT 20  ALKPHOS 56    Assessment and Plan:  Venous evaluation today does not demonstrate underlying venous pathology. There is no evidence of superficial venous insufficiency and the deep venous system is normal by ultrasound. I have recommended continued conservative management including continued use of her knee-high compression garments when she is awake. I also recommended a regular exercise/walking program and elevation of the right foot as much as possible during the day. Avoidance of high sodium foods was also recommended as a potential means of reducing fluid retention. I told Brenda [name] to contact us if she would like to be seen in follow-up if her symptoms or edema worsen.  Thank you for this interesting consult.  I greatly enjoyed  meeting Brenda [name] and look forward to participating in their care.  SignedAletta Edouard T 08/27/2014, 11:50 AM     I spent a total of 40 minutes face to face in clinical consultation, greater than 50% of which was counseling/coordinating care for right lower extremity edema.

## 2014-09-10 ENCOUNTER — Ambulatory Visit: Payer: BC Managed Care – PPO | Admitting: Endocrinology

## 2014-09-11 ENCOUNTER — Ambulatory Visit (INDEPENDENT_AMBULATORY_CARE_PROVIDER_SITE_OTHER): Payer: BC Managed Care – PPO | Admitting: Endocrinology

## 2014-09-11 ENCOUNTER — Encounter: Payer: Self-pay | Admitting: Endocrinology

## 2014-09-11 ENCOUNTER — Ambulatory Visit
Admission: RE | Admit: 2014-09-11 | Discharge: 2014-09-11 | Disposition: A | Discharge status: Home / Self Care | Payer: BC Managed Care – PPO | Source: Ambulatory Visit | Attending: Endocrinology | Admitting: Endocrinology

## 2014-09-11 VITALS — BP 122/76 | HR 96 | Temp 97.7°F | Ht 66.0 in | Wt 170.0 lb

## 2014-09-11 DIAGNOSIS — R059 Cough, unspecified: Secondary | ICD-10-CM

## 2014-09-11 DIAGNOSIS — R05 Cough: Secondary | ICD-10-CM

## 2014-09-11 MED ORDER — PROMETHAZINE-CODEINE 6.25-10 MG/5ML PO SYRP: 5.0000 mL | ORAL_SOLUTION | Freq: Four times a day (QID) | ORAL | Status: DC | PRN

## 2014-09-11 MED ORDER — OMEPRAZOLE 40 MG PO CPDR: 40.0000 mg | DELAYED_RELEASE_CAPSULE | Freq: Every day | ORAL | Status: DC

## 2014-09-11 NOTE — Patient Instructions (Addendum)
A chest x-ray requested for you today.  We'll let you know about the results. Please come back for a regular physical appointment in 1 month. Here is a refill of your cough syrup.   i have sent a prescription to your pharmacy, to try prilosec for the cough.   If the cough persists, please call, so i can request for you to see a lung specialist.

## 2014-09-11 NOTE — Progress Notes (Signed)
Subjective:    Patient ID: Brenda [name], female    DOB: 03-16-1951, 64 y.o.   MRN: [id number]  HPI Pt states 4 weeks of moderate prod-quality cough in the chest, but no assoc fever.  No earache or nasal congestion.  She was seen at [facility] urgent care, and rx'ed azithromycin.   Past Medical History  Diagnosis Date  . THYROID NODULE, RIGHT 05/11/2008  . DM 09/18/2007  . HEARING LOSS 09/15/2009  . OSTEOPENIA 04/20/2007  . HYPERLIPIDEMIA 04/25/2010  . ABDOMINAL/PELVIC SWELLING MASS/LUMP UNSPEC SITE 09/18/2007  . Palpitations 03/13/2008  . ACUTE URIS OF UNSPECIFIED SITE 06/06/2007  . ASTHMATIC BRONCHITIS, ACUTE 05/11/2008  . Glucose intolerance (impaired glucose tolerance)     Past Surgical History  Procedure Laterality Date  . Knee surgery  1991    Left  . Left knee cyst  1985  . Hernia repair  1992  . Lymph node removal    . Dilation and curettage, diagnostic / therapeutic      History   Social History  . Marital Status: Married    Spouse Name: N/A  . Number of Children: N/A  . Years of Education: N/A   Occupational History  . [place] History Main Topics  . Smoking status: Former Research scientist (life sciences)  . Smokeless tobacco: Not on file  . Alcohol Use: No  . Drug Use: Not on file  . Sexual Activity: Not on file   Other Topics Concern  . Not on file   Social History Narrative    Current Outpatient Prescriptions on File Prior to Visit  Medication Sig Dispense Refill  . atorvastatin (LIPITOR) 10 MG tablet Take 1 tablet (10 mg total) by mouth daily. 90 tablet 3  . benzonatate (TESSALON) 100 MG capsule Take 1 capsule (100 mg total) by mouth 3 (three) times daily as needed for cough. 30 capsule 1  . clotrimazole-betamethasone (LOTRISONE) lotion 3 times a day, as needed for rash 30 mL 2  . naproxen (NAPROSYN) 500 MG tablet Take 1 tablet (500 mg total) by mouth 2 (two) times daily as needed for mild pain, moderate pain or headache (TAKE WITH MEALS.). 20 tablet 0  .  triamcinolone cream (KENALOG) 0.1 % Apply 1 application topically 4 (four) times daily. As needed for rash or itching 80 g 2   No current facility-administered medications on file prior to visit.    Allergies  Allergen Reactions  . Hydrocodone Other (See Comments)    "makes me high"    Family History  Problem Relation Age of Onset  . Cancer Mother     Breast Cancer  . Cancer Father     "Stomach" Cancer  . Cancer Sister     Colon Cancer  . Kidney disease Brother     Kidney Transplant    BP 122/76 mmHg  Pulse 96  Temp(Src) 97.7 F (36.5 C) (Oral)  Ht 5\' 6"  (1.676 m)  Wt 170 lb (77.111 kg)  BMI 27.45 kg/m2  SpO2 97%    Review of Systems Denies sob and wheezing.      Objective:   Physical Exam VITAL SIGNS:  See vs page GENERAL: no distress head: no deformity. eyes: no periorbital swelling, no proptosis external nose and ears are normal mouth: no lesion seen. Both eac's and tm's are normal. LUNGS:  Clear to auscultation.     CXR: NAD    Assessment & Plan:  URI, persistent.  She may have a component of allergic rhinitis.  She declines claritin-D and flonase.  Cough, persistent.  Patient is advised the following: Patient Instructions  A chest x-ray requested for you today.  We'll let you know about the results. Please come back for a regular physical appointment in 1 month. Here is a refill of your cough syrup.   i have sent a prescription to your pharmacy, to try prilosec for the cough.   If the cough persists, please call, so i can request for you to see a lung specialist.

## 2014-10-05 ENCOUNTER — Ambulatory Visit: Payer: BC Managed Care – PPO | Admitting: Endocrinology

## 2014-10-13 ENCOUNTER — Telehealth: Payer: Self-pay | Admitting: Endocrinology

## 2014-10-13 NOTE — Telephone Encounter (Signed)
error 

## 2014-10-15 ENCOUNTER — Encounter: Payer: BC Managed Care – PPO | Admitting: Endocrinology

## 2014-11-11 ENCOUNTER — Telehealth: Payer: Self-pay | Admitting: Endocrinology

## 2014-11-11 MED ORDER — ATORVASTATIN CALCIUM 10 MG PO TABS: 10.0000 mg | ORAL_TABLET | Freq: Every day | ORAL | Status: DC

## 2014-11-11 NOTE — Telephone Encounter (Signed)
Patient called stating that she would like a refill on her medication   Rx: Lipitor  Pharmacy: [facility] [place] [facility]   Thank you

## 2014-11-11 NOTE — Telephone Encounter (Signed)
Rx sent per pt's request.  

## 2014-11-20 ENCOUNTER — Encounter: Payer: BC Managed Care – PPO | Admitting: Endocrinology

## 2014-11-20 ENCOUNTER — Encounter: Payer: Self-pay | Admitting: Endocrinology

## 2014-11-20 ENCOUNTER — Ambulatory Visit (INDEPENDENT_AMBULATORY_CARE_PROVIDER_SITE_OTHER): Payer: BC Managed Care – PPO | Admitting: Endocrinology

## 2014-11-20 VITALS — BP 122/74 | HR 90 | Temp 98.0°F | Ht 66.0 in | Wt 172.0 lb

## 2014-11-20 DIAGNOSIS — Z23 Encounter for immunization: Secondary | ICD-10-CM

## 2014-11-20 DIAGNOSIS — E119 Type 2 diabetes mellitus without complications: Secondary | ICD-10-CM

## 2014-11-20 DIAGNOSIS — Z0000 Encounter for general adult medical examination without abnormal findings: Secondary | ICD-10-CM | POA: Diagnosis not present

## 2014-11-20 MED ORDER — NAPROXEN 500 MG PO TABS: 500.0000 mg | ORAL_TABLET | Freq: Two times a day (BID) | ORAL | Status: DC | PRN

## 2014-11-20 MED ORDER — OMEPRAZOLE 40 MG PO CPDR: 40.0000 mg | DELAYED_RELEASE_CAPSULE | Freq: Every day | ORAL | Status: DC

## 2014-11-20 NOTE — Progress Notes (Signed)
Subjective:    Patient ID: Brenda [name], female    DOB: [birth year], 64 y.o.   MRN: [id number]  HPI Pt is here for regular wellness examination, and is feeling pretty well in general, and says chronic med probs are stable, except as noted below Past Medical History  Diagnosis Date  . THYROID NODULE, RIGHT 05/11/2008  . DM 09/18/2007  . HEARING LOSS 09/15/2009  . OSTEOPENIA 04/20/2007  . HYPERLIPIDEMIA 04/25/2010  . ABDOMINAL/PELVIC SWELLING MASS/LUMP UNSPEC SITE 09/18/2007  . Palpitations 03/13/2008  . ACUTE URIS OF UNSPECIFIED SITE 06/06/2007  . ASTHMATIC BRONCHITIS, ACUTE 05/11/2008  . Glucose intolerance (impaired glucose tolerance)     Past Surgical History  Procedure Laterality Date  . Knee surgery  1991    Left  . Left knee cyst  1985  . Hernia repair  1992  . Lymph node removal    . Dilation and curettage, diagnostic / therapeutic      Social History   Social History  . Marital Status: Married    Spouse Name: N/A  . Number of Children: N/A  . Years of Education: N/A   Occupational History  . [place] History Main Topics  . Smoking status: Former Research scientist (life sciences)  . Smokeless tobacco: Not on file  . Alcohol Use: No  . Drug Use: Not on file  . Sexual Activity: Not on file   Other Topics Concern  . Not on file   Social History Narrative    Current Outpatient Prescriptions on File Prior to Visit  Medication Sig Dispense Refill  . clotrimazole-betamethasone (LOTRISONE) lotion 3 times a day, as needed for rash 30 mL 2  . triamcinolone cream (KENALOG) 0.1 % Apply 1 application topically 4 (four) times daily. As needed for rash or itching 80 g 2  . atorvastatin (LIPITOR) 10 MG tablet Take 1 tablet (10 mg total) by mouth daily. (Patient not taking: Reported on 11/20/2014) 90 tablet 3   No current facility-administered medications on file prior to visit.    Allergies  Allergen Reactions  . Hydrocodone Other (See Comments)    "makes me high"    Family  History  Problem Relation Age of Onset  . Cancer Mother     Breast Cancer  . Cancer Father     "Stomach" Cancer  . Cancer Sister     Colon Cancer  . Kidney disease Brother     Kidney Transplant    BP 122/74 mmHg  Pulse 90  Temp(Src) 98 F (36.7 C) (Oral)  Ht 5\' 6"  (1.676 m)  Wt 172 lb (78.019 kg)  BMI 27.77 kg/m2  SpO2 94%  Review of Systems  Constitutional: Negative for fever and unexpected weight change.  HENT: Negative for hearing loss.   Eyes: Negative for visual disturbance.  Respiratory: Negative for shortness of breath.   Cardiovascular: Negative for chest pain.  Gastrointestinal: Negative for anal bleeding.  Endocrine: Negative for cold intolerance.  Genitourinary: Negative for hematuria.  Musculoskeletal: Negative for gait problem.  Skin: Negative for wound.  Allergic/Immunologic: Negative for environmental allergies.  Neurological: Negative for syncope, numbness and headaches.  Psychiatric/Behavioral:       [facility]       Objective:   Physical Exam VS: see vs page GEN: no distress HEAD: head: no deformity eyes: no periorbital swelling, no proptosis external nose and ears are normal mouth: no lesion seen NECK: supple, thyroid is not enlarged CHEST WALL: no deformity LUNGS:  Clear  to auscultation.   BREASTS: sees gyn CV: reg rate and rhythm, no murmur ABD: abdomen is soft, nontender.  no hepatosplenomegaly.  not distended.  no hernia GENITALIA/RECTAL: sees gyn MUSCULOSKELETAL: muscle bulk and strength are grossly normal.  no obvious joint swelling.  gait is normal and steady EXTEMITIES: no deformity.  no ulcer on the feet.  feet are of normal color and temp.  Trace bilat leg edema PULSES: dorsalis pedis intact bilat.  no carotid bruit NEURO:  cn 2-12 grossly intact.   readily moves all 4's.  sensation is intact to touch on the feet SKIN:  Normal texture and temperature.  No rash or suspicious lesion is visible.   NODES:  None palpable  at the neck PSYCH: alert, well-oriented.  Does not appear anxious nor depressed.   i personally reviewed electrocardiogram tracing: normal     Assessment & Plan:    Patient is advised the following: Patient Instructions  please consider these measures for your health:  minimize alcohol.  do not use tobacco products.  have a colonoscopy at least every 10 years from age 24.  Women should have an annual mammogram from age 82.  keep firearms safely stored.  always use seat belts.  have working smoke alarms in your home.  see an eye doctor and dentist regularly.  never drive under the influence of alcohol or drugs (including prescription drugs).   blood tests are requested for you today.  We'll let you know about the results.   Please return in 1 year.

## 2014-11-20 NOTE — Progress Notes (Signed)
we discussed code status.  pt requests full code, but would not want to be started or maintained on artificial life-support measures if there was not a reasonable chance of recovery 

## 2014-11-20 NOTE — Patient Instructions (Addendum)
please consider these measures for your health:  minimize alcohol.  do not use tobacco products.  have a colonoscopy at least every 10 years from age 62.  Women should have an annual mammogram from age 59.  keep firearms safely stored.  always use seat belts.  have working smoke alarms in your home.  see an eye doctor and dentist regularly.  never drive under the influence of alcohol or drugs (including prescription drugs).   blood tests are requested for you today.  We'll let you know about the results.   Please return in 1 year.

## 2014-11-23 ENCOUNTER — Other Ambulatory Visit: Payer: BC Managed Care – PPO

## 2014-11-25 ENCOUNTER — Other Ambulatory Visit (INDEPENDENT_AMBULATORY_CARE_PROVIDER_SITE_OTHER): Payer: BC Managed Care – PPO

## 2014-11-25 DIAGNOSIS — Z0000 Encounter for general adult medical examination without abnormal findings: Secondary | ICD-10-CM

## 2014-11-25 DIAGNOSIS — E119 Type 2 diabetes mellitus without complications: Secondary | ICD-10-CM | POA: Diagnosis not present

## 2014-11-25 LAB — HEPATIC FUNCTION PANEL
ALT: 23 U/L (ref 0–35)
AST: 18 U/L (ref 0–37)
Albumin: 4.2 g/dL (ref 3.5–5.2)
Alkaline Phosphatase: 70 U/L (ref 39–117)
BILIRUBIN TOTAL: 0.3 mg/dL (ref 0.2–1.2)
Bilirubin, Direct: 0 mg/dL (ref 0.0–0.3)
Total Protein: 7 g/dL (ref 6.0–8.3)

## 2014-11-25 LAB — CBC WITH DIFFERENTIAL/PLATELET
BASOS PCT: 0.9 % (ref 0.0–3.0)
Basophils Absolute: 0 10*3/uL (ref 0.0–0.1)
EOS ABS: 0.2 10*3/uL (ref 0.0–0.7)
Eosinophils Relative: 3 % (ref 0.0–5.0)
HCT: 39.8 % (ref 36.0–46.0)
HEMOGLOBIN: 12.6 g/dL (ref 12.0–15.0)
LYMPHS ABS: 1.8 10*3/uL (ref 0.7–4.0)
Lymphocytes Relative: 33.2 % (ref 12.0–46.0)
MCHC: 31.6 g/dL (ref 30.0–36.0)
MCV: 73.1 fl — ABNORMAL LOW (ref 78.0–100.0)
MONO ABS: 0.6 10*3/uL (ref 0.1–1.0)
Monocytes Relative: 10.9 % (ref 3.0–12.0)
NEUTROS PCT: 52 % (ref 43.0–77.0)
Neutro Abs: 2.8 10*3/uL (ref 1.4–7.7)
PLATELETS: 232 10*3/uL (ref 150.0–400.0)
RBC: 5.45 Mil/uL — ABNORMAL HIGH (ref 3.87–5.11)
RDW: 15.5 % (ref 11.5–15.5)
WBC: 5.4 10*3/uL (ref 4.0–10.5)

## 2014-11-25 LAB — BASIC METABOLIC PANEL
BUN: 20 mg/dL (ref 6–23)
CO2: 27 mEq/L (ref 19–32)
Calcium: 9.1 mg/dL (ref 8.4–10.5)
Chloride: 105 mEq/L (ref 96–112)
Creatinine, Ser: 0.72 mg/dL (ref 0.40–1.20)
GFR: 105.61 mL/min (ref >60.00)
GLUCOSE: 112 mg/dL — AB (ref 70–99)
Potassium: 3.9 mEq/L (ref 3.5–5.1)
Sodium: 139 mEq/L (ref 135–145)

## 2014-11-25 LAB — URINALYSIS, ROUTINE W REFLEX MICROSCOPIC
Bilirubin Urine: NEGATIVE
Hgb urine dipstick: NEGATIVE
Ketones, ur: NEGATIVE
Leukocytes, UA: NEGATIVE
Nitrite: NEGATIVE
PH: 5.5 (ref 5.0–8.0)
RBC / HPF: NONE SEEN (ref 0–?)
SPECIFIC GRAVITY, URINE: 1.025 (ref 1.000–1.030)
Total Protein, Urine: NEGATIVE
URINE GLUCOSE: NEGATIVE
Urobilinogen, UA: 0.2 (ref 0.0–1.0)
WBC UA: NONE SEEN (ref 0–?)

## 2014-11-25 LAB — LIPID PANEL
CHOL/HDL RATIO: 3
Cholesterol: 139 mg/dL (ref 0–200)
HDL: 42.6 mg/dL (ref >39.00)
LDL CALC: 72 mg/dL (ref 0–99)
NonHDL: 96.7
Triglycerides: 122 mg/dL (ref 0.0–149.0)
VLDL: 24.4 mg/dL (ref 0.0–40.0)

## 2014-11-25 LAB — MICROALBUMIN / CREATININE URINE RATIO
CREATININE, U: 183.8 mg/dL
MICROALB UR: 1.1 mg/dL (ref 0.0–1.9)
Microalb Creat Ratio: 0.6 mg/g (ref 0.0–30.0)

## 2014-11-25 LAB — TSH: TSH: 1.02 u[IU]/mL (ref 0.35–4.50)

## 2014-11-25 LAB — HEMOGLOBIN A1C: HEMOGLOBIN A1C: 6.1 % (ref 4.6–6.5)

## 2015-01-04 ENCOUNTER — Other Ambulatory Visit: Payer: Self-pay | Admitting: Obstetrics and Gynecology

## 2015-01-06 LAB — CYTOLOGY - PAP

## 2015-05-21 ENCOUNTER — Encounter: Payer: Self-pay | Admitting: Endocrinology

## 2015-05-21 ENCOUNTER — Ambulatory Visit (INDEPENDENT_AMBULATORY_CARE_PROVIDER_SITE_OTHER): Payer: BC Managed Care – PPO | Admitting: Endocrinology

## 2015-05-21 VITALS — BP 118/80 | HR 95 | Temp 98.6°F | Ht 66.0 in | Wt 165.0 lb

## 2015-05-21 DIAGNOSIS — M545 Low back pain, unspecified: Secondary | ICD-10-CM

## 2015-05-21 NOTE — Patient Instructions (Signed)
Please see a specialist.  you will receive a phone call, about a day and time for an appointment 

## 2015-05-21 NOTE — Progress Notes (Signed)
Subjective:    Patient ID: Brenda [name], female    DOB: 1950/12/17, 65 y.o.   MRN: [id number]  HPI In 1999, pt had severe pain at the lower back, and was seen in ER several times.  She got better with pain medication.  sxs have recurred x 1 week.  No assoc bowel or bladder retention.  She has been on motrin on an ongoing basis, but this helps only temporarily.  No recent injury.   Past Medical History  Diagnosis Date  . THYROID NODULE, RIGHT 05/11/2008  . DM 09/18/2007  . HEARING LOSS 09/15/2009  . OSTEOPENIA 04/20/2007  . HYPERLIPIDEMIA 04/25/2010  . ABDOMINAL/PELVIC SWELLING MASS/LUMP UNSPEC SITE 09/18/2007  . Palpitations 03/13/2008  . ACUTE URIS OF UNSPECIFIED SITE 06/06/2007  . ASTHMATIC BRONCHITIS, ACUTE 05/11/2008  . Glucose intolerance (impaired glucose tolerance)     Past Surgical History  Procedure Laterality Date  . Knee surgery  1991    Left  . Left knee cyst  1985  . Hernia repair  1992  . Lymph node removal    . Dilation and curettage, diagnostic / therapeutic      Social History   Social History  . Marital Status: Married    Spouse Name: N/A  . Number of Children: N/A  . Years of Education: N/A   Occupational History  . [place] History Main Topics  . Smoking status: Former Research scientist (life sciences)  . Smokeless tobacco: Not on file  . Alcohol Use: No  . Drug Use: Not on file  . Sexual Activity: Not on file   Other Topics Concern  . Not on file   Social History Narrative    Current Outpatient Prescriptions on File Prior to Visit  Medication Sig Dispense Refill  . clotrimazole-betamethasone (LOTRISONE) lotion 3 times a day, as needed for rash 30 mL 2  . naproxen (NAPROSYN) 500 MG tablet Take 1 tablet (500 mg total) by mouth 2 (two) times daily as needed for mild pain, moderate pain or headache (TAKE WITH MEALS.). 60 tablet 11  . omeprazole (PRILOSEC) 40 MG capsule Take 1 capsule (40 mg total) by mouth daily. 30 capsule 11  . triamcinolone cream (KENALOG)  0.1 % Apply 1 application topically 4 (four) times daily. As needed for rash or itching 80 g 2  . albuterol (VOSPIRE ER) 8 MG 12 hr tablet Take 8 mg by mouth every 12 (twelve) hours. Reported on 05/21/2015     No current facility-administered medications on file prior to visit.    Allergies  Allergen Reactions  . Hydrocodone Other (See Comments)    "makes me high"    Family History  Problem Relation Age of Onset  . Cancer Mother     Breast Cancer  . Cancer Father     "Stomach" Cancer  . Cancer Sister     Colon Cancer  . Kidney disease Brother     Kidney Transplant    BP 118/80 mmHg  Pulse 95  Temp(Src) 98.6 F (37 C) (Oral)  Ht 5\' 6"  (1.676 m)  Wt 165 lb (74.844 kg)  BMI 26.64 kg/m2  SpO2 95%    Review of Systems No numbness of the feet.  No rash on the back.      Objective:   Physical Exam VITAL SIGNS:  See vs page.  GENERAL: no distress. Spine: nontender. Neuro: sensation is intact to touch throughout the LE's.  Gait: normal      Assessment & Plan:  Low-back pain, recurrent  Patient is advised the following: Patient Instructions  Please see a specialist.  you will receive a phone call, about a day and time for an appointment.

## 2015-07-02 ENCOUNTER — Encounter: Payer: Self-pay | Admitting: Gastroenterology

## 2015-07-13 ENCOUNTER — Encounter: Payer: Self-pay | Admitting: Gastroenterology

## 2015-08-03 ENCOUNTER — Ambulatory Visit: Payer: BC Managed Care – PPO | Admitting: Family Medicine

## 2015-08-25 ENCOUNTER — Ambulatory Visit (INDEPENDENT_AMBULATORY_CARE_PROVIDER_SITE_OTHER)
Admission: RE | Admit: 2015-08-25 | Discharge: 2015-08-25 | Disposition: A | Discharge status: Home / Self Care | Payer: BC Managed Care – PPO | Source: Ambulatory Visit | Attending: Family Medicine | Admitting: Family Medicine

## 2015-08-25 ENCOUNTER — Encounter: Payer: Self-pay | Admitting: Family Medicine

## 2015-08-25 ENCOUNTER — Ambulatory Visit (INDEPENDENT_AMBULATORY_CARE_PROVIDER_SITE_OTHER): Payer: BC Managed Care – PPO | Admitting: Family Medicine

## 2015-08-25 VITALS — BP 114/78 | HR 80 | Ht 66.0 in | Wt 160.0 lb

## 2015-08-25 DIAGNOSIS — M545 Low back pain, unspecified: Secondary | ICD-10-CM | POA: Insufficient documentation

## 2015-08-25 DIAGNOSIS — M5416 Radiculopathy, lumbar region: Secondary | ICD-10-CM | POA: Diagnosis not present

## 2015-08-25 MED ORDER — DICLOFENAC SODIUM 2 % TD SOLN
TRANSDERMAL | Status: DC
Start: 2015-08-25 — End: 2015-12-21

## 2015-08-25 NOTE — Patient Instructions (Addendum)
Good to see you.  Xray downstairs today  Ice 20 minutes 2 times daily. Usually after activity and before bed. Exercises 3 times a week.  Good shoes with rigid bottom.  Jalene Mullet, [place] or New balance greater then 700 pennsaid pinkie amount topically 2 times daily as needed.  Vitamin D 2000 IU daily  Turmeric 500mg  daily  Iron 65mg  daily with 500mg  of vitamin C See me again in 4 weeks to makes sure we are making progress

## 2015-08-25 NOTE — Assessment & Plan Note (Signed)
Low back pain noted.  Likely MSK. No spinous process tenderness. Patient is more sore and appears palmar musculature. Negative straight leg test. Seems to be more of muscle spasms. Discussed with patient when reviewing lens possibility of iron deficiency as well as vitamin D deficiency contributing to some of this. We discussed how diabetes can cause inflammation in this could be contributing to some of the discomfort as well. We discussed icing modalities as well as proper shoes that can be beneficial and help with alignment. Patient given trial of topical anti-inflammatories and we'll see how she responds. Patient will follow-up again in 3-4 weeks for further evaluation. X-rays are pending

## 2015-08-25 NOTE — Progress Notes (Signed)
Pre visit review using our clinic review tool, if applicable. No additional management support is needed unless otherwise documented below in the visit note. 

## 2015-08-25 NOTE — Progress Notes (Signed)
[name] [address], [place] [address] Phone: [phone] Subjective:    I'm seeing this patient by the request  of:  Renato Shin, MD   CC: low back pain   [id number] Brenda [name] is a 65 y.o. female coming in with complaint of low back pain. Patient is had this pain intermittently for multiple years. Reviewed patient's chart she did have an MRI of the lumbar spine in 2013 that showed very minimal osteoarthritic changes. This was independently visualized by me. Patient states since this time she is having pain were taken be severe enough that stops her from activity. States that usually a flexed motion such as reaching down can sometimes cause her to have a spasm. She is to be very careful to straighten up and then it seems to go away and she can do daily activities. Sometimes has a constant aching soreness to the back that she rates the severity of 6 out of 10. Does respond well to anti-inflammatories. Has not tried any other modalities such as icing or heat. Patient think she didn't do formal physical therapy multiple years ago. Denies any radiation down the legs any numbness or tingling. Seems to be more of a bilateral pain. Can be uncomfortable at night but does not wake her up at night. Has been in the emergency department multiple times for this back spasm and does respond to prednisone as well as pain medications.    Past Medical History  Diagnosis Date  . THYROID NODULE, RIGHT 05/11/2008  . DM 09/18/2007  . HEARING LOSS 09/15/2009  . OSTEOPENIA 04/20/2007  . HYPERLIPIDEMIA 04/25/2010  . ABDOMINAL/PELVIC SWELLING MASS/LUMP UNSPEC SITE 09/18/2007  . Palpitations 03/13/2008  . ACUTE URIS OF UNSPECIFIED SITE 06/06/2007  . ASTHMATIC BRONCHITIS, ACUTE 05/11/2008  . Glucose intolerance (impaired glucose tolerance)    Past Surgical History  Procedure Laterality Date  . Knee surgery  1991    Left  . Left knee cyst  1985  . Hernia repair  1992  .  Lymph node removal    . Dilation and curettage, diagnostic / therapeutic     Social History   Social History  . Marital Status: Married    Spouse Name: N/A  . Number of Children: N/A  . Years of Education: N/A   Occupational History  . [employer] History Main Topics  . Smoking status: Former Research scientist (life sciences)  . Smokeless tobacco: None  . Alcohol Use: No  . Drug Use: None  . Sexual Activity: Not Asked   Other Topics Concern  . None   Social History Narrative   Allergies  Allergen Reactions  . Hydrocodone Other (See Comments)    "makes me high"   Family History  Problem Relation Age of Onset  . Cancer Mother     Breast Cancer  . Cancer Father     "Stomach" Cancer  . Cancer Sister     Colon Cancer  . Kidney disease Brother     Kidney Transplant    Past medical history, social, surgical and family history all reviewed in electronic medical record.  No pertanent information unless stated regarding to the chief complaint.   Review of Systems: No headache, visual changes, nausea, vomiting, diarrhea, constipation, dizziness, abdominal pain, skin rash, fevers, chills, night sweats, weight loss, swollen lymph nodes, body aches, joint swelling, chest pain, shortness of breath, mood changes.   Objective Blood pressure 114/78, pulse 80, height 5\' 6"  (1.676  m), weight 160 lb (72.576 kg), SpO2 97 %.  General: No apparent distress alert and oriented x3 mood and affect normal, dressed appropriately.  HEENT: Pupils equal, extraocular movements intact  Respiratory: Patient's speak in full sentences and does not appear short of breath  Cardiovascular: No lower extremity edema, non tender, no erythema  Skin: Warm dry intact with no signs of infection or rash on extremities or on axial skeleton.  Abdomen: Soft nontender  Neuro: Cranial nerves II through XII are intact, neurovascularly intact in all extremities with 2+ DTRs and 2+ pulses.  Lymph: No lymphadenopathy of posterior  or anterior cervical chain or axillae bilaterally.  Gait normal with good balance and coordination.  MSK:  Non tender with full range of motion and good stability and symmetric strength and tone of shoulders, elbows, wrist, hip, knee and ankles bilaterally.  Back Exam:  Inspection: Unremarkable  Motion: Flexion 25 deg, Extension 15 deg, Side Bending to 35 deg bilaterally,  Rotation to 35 deg bilaterally  SLR laying: Negative  XSLR laying: Negative  Palpable tenderness: mild tenderness to palpation diffusely over the lumbar spine.  FABER: positive bilateral Sensory change: Gross sensation intact to all lumbar and sacral dermatomes.  Reflexes: 2+ at both patellar tendons, 2+ at achilles tendons, Babinski's downgoing.  Strength at foot  Plantar-flexion: 5/5 Dorsi-flexion: 5/5 Eversion: 5/5 Inversion: 5/5  Leg strength  Quad: 5/5 Hamstring: 5/5 Hip flexor: 4/5 Hip abductors: 4/5  Gait unremarkable.    Impression and Recommendations:     This case required medical decision making of moderate complexity.      Note: This dictation was prepared with Dragon dictation along with smaller phrase technology. Any transcriptional errors that result from this process are unintentional.

## 2015-08-27 ENCOUNTER — Ambulatory Visit (AMBULATORY_SURGERY_CENTER): Payer: Self-pay | Admitting: *Deleted

## 2015-08-27 VITALS — Ht 65.0 in | Wt 160.0 lb

## 2015-08-27 DIAGNOSIS — Z1211 Encounter for screening for malignant neoplasm of colon: Secondary | ICD-10-CM

## 2015-08-27 MED ORDER — SUPREP BOWEL PREP KIT 17.5-3.13-1.6 GM/177ML PO SOLN: 1.0000 | Freq: Once | ORAL | Status: DC

## 2015-08-27 NOTE — Progress Notes (Signed)
Patient denies any allergies to egg or soy products. Patient denies complications with anesthesia/sedation.  Patient denies oxygen use at home and denies diet medications. Emmi instructions for colonoscopy explained ut patient refused.  Pamphlet given.

## 2015-09-17 ENCOUNTER — Encounter: Payer: Self-pay | Admitting: Gastroenterology

## 2015-09-17 ENCOUNTER — Ambulatory Visit (AMBULATORY_SURGERY_CENTER): Payer: BC Managed Care – PPO | Admitting: Gastroenterology

## 2015-09-17 VITALS — BP 129/82 | HR 72 | Temp 97.7°F | Resp 14 | Ht 65.0 in | Wt 160.0 lb

## 2015-09-17 DIAGNOSIS — Z1211 Encounter for screening for malignant neoplasm of colon: Secondary | ICD-10-CM

## 2015-09-17 MED ORDER — SODIUM CHLORIDE 0.9 % IV SOLN: 500.0000 mL | INTRAVENOUS | Status: DC

## 2015-09-17 NOTE — Op Note (Signed)
[facility] Patient Name: Brenda [name] Procedure Date: 09/17/2015 9:37 AM MRN: [id number] Endoscopist: Mauri Pole , MD Age: 65 Referring MD:  Date of Birth: Dec 07, 1950 Gender: Female Procedure:                Colonoscopy Indications:              Screening for colorectal malignant neoplasm, Last                            colonoscopy 10 years ago Medicines:                Monitored Anesthesia Care Procedure:                Pre-Anesthesia Assessment:                           - Prior to the procedure, a History and Physical                            was performed, and patient medications and                            allergies were reviewed. The patient's tolerance of                            previous anesthesia was also reviewed. The risks                            and benefits of the procedure and the sedation                            options and risks were discussed with the patient.                            All questions were answered, and informed consent                            was obtained. Prior Anticoagulants: The patient has                            taken no previous anticoagulant or antiplatelet                            agents. ASA Grade Assessment: II - A patient with                            mild systemic disease. After reviewing the risks                            and benefits, the patient was deemed in                            satisfactory condition to undergo the procedure.  After obtaining informed consent, the colonoscope                            was passed under direct vision. Throughout the                            procedure, the patient's blood pressure, pulse, and                            oxygen saturations were monitored continuously. The                            Model CF-HQ190L [id number]) scope was introduced                            through the anus and advanced to the the cecum,                       identified by appendiceal orifice and ileocecal                            valve. The colonoscopy was performed without                            difficulty. The patient tolerated the procedure                            well. The quality of the bowel preparation was                            adequate. The ileocecal valve, appendiceal orifice,                            and rectum were photographed. Scope In: 9:45:51 AM Scope Out: 9:57:51 AM Scope Withdrawal Time: 0 hours 8 minutes 57 seconds  Total Procedure Duration: 0 hours 12 minutes 0 seconds  Findings:                 The perianal and digital rectal examinations were                            normal.                           Multiple small-mouthed diverticula were found in                            the sigmoid colon, descending colon, transverse                            colon and ascending colon.                           Non-bleeding internal hemorrhoids were found during  retroflexion. The hemorrhoids were small.                           The exam was otherwise without abnormality. Complications:            No immediate complications. Estimated Blood Loss:     Estimated blood loss: none. Impression:               - Diverticulosis in the sigmoid colon, in the                            descending colon, in the transverse colon and in                            the ascending colon.                           - Non-bleeding internal hemorrhoids.                           - The examination was otherwise normal.                           - No specimens collected. Recommendation:           - Patient has a contact number available for                            emergencies. The signs and symptoms of potential                            delayed complications were discussed with the                            patient. Return to normal activities tomorrow.                             Written discharge instructions were provided to the                            patient.                           - Resume previous diet.                           - Continue present medications.                           - Repeat colonoscopy in 10 years for screening                            purposes.                           - Return to GI clinic PRN. Mauri Pole, MD 09/17/2015 10:01:15 AM This report has been signed electronically.

## 2015-09-17 NOTE — Progress Notes (Signed)
Pt. Has a rash stated that it is all over her body,from [place] war.

## 2015-09-17 NOTE — Patient Instructions (Signed)
YOU HAD AN ENDOSCOPIC PROCEDURE TODAY AT [facility]:   Refer to the procedure report that was given to you for any specific questions about what was found during the examination.  If the procedure report does not answer your questions, please call your gastroenterologist to clarify.  If you requested that your care partner not be given the details of your procedure findings, then the procedure report has been included in a sealed envelope for you to review at your convenience later.  YOU SHOULD EXPECT: Some feelings of bloating in the abdomen. Passage of more gas than usual.  Walking can help get rid of the air that was put into your GI tract during the procedure and reduce the bloating. If you had a lower endoscopy (such as a colonoscopy or flexible sigmoidoscopy) you may notice spotting of blood in your stool or on the toilet paper. If you underwent a bowel prep for your procedure, you may not have a normal bowel movement for a few days.  Please Note:  You might notice some irritation and congestion in your nose or some drainage.  This is from the oxygen used during your procedure.  There is no need for concern and it should clear up in a day or so.  SYMPTOMS TO REPORT IMMEDIATELY:   Following lower endoscopy (colonoscopy or flexible sigmoidoscopy):  Excessive amounts of blood in the stool  Significant tenderness or worsening of abdominal pains  Swelling of the abdomen that is new, acute  Fever of 100F or higher    For urgent or emergent issues, a gastroenterologist can be reached at any hour by calling [phone].   DIET: Your first meal following the procedure should be a small meal and then it is ok to progress to your normal diet. Heavy or fried foods are harder to digest and may make you feel nauseous or bloated.  Likewise, meals heavy in dairy and vegetables can increase bloating.  Drink plenty of fluids but you should avoid alcoholic beverages for 24  hours.  ACTIVITY:  You should plan to take it easy for the rest of today and you should NOT DRIVE or use heavy machinery until tomorrow (because of the sedation medicines used during the test).    FOLLOW UP: Our staff will call the number listed on your records the next business day following your procedure to check on you and address any questions or concerns that you may have regarding the information given to you following your procedure. If we do not reach you, we will leave a message.  However, if you are feeling well and you are not experiencing any problems, there is no need to return our call.  We will assume that you have returned to your regular daily activities without incident.  If any biopsies were taken you will be contacted by phone or by letter within the next 1-3 weeks.  Please call us at [phone] if you have not heard about the biopsies in 3 weeks.    SIGNATURES/CONFIDENTIALITY: You and/or your care partner have signed paperwork which will be entered into your electronic medical record.  These signatures attest to the fact that that the information above on your After Visit Summary has been reviewed and is understood.  Full responsibility of the confidentiality of this discharge information lies with you and/or your care-partner.    Information on diverticulosis ,hemorrhoids, and high fiber diet given to you today

## 2015-09-17 NOTE — Progress Notes (Signed)
Report to PACU, RN, vss, BBS= Clear.  

## 2015-09-20 ENCOUNTER — Telehealth: Payer: Self-pay | Admitting: *Deleted

## 2015-09-20 NOTE — Telephone Encounter (Signed)
  Follow up Call-  Call back number 09/17/2015  Post procedure Call Back phone  # [phone]  Permission to leave phone message Yes     [phone], Called, no answer, left message.

## 2015-10-25 ENCOUNTER — Encounter: Payer: Self-pay | Admitting: Family Medicine

## 2015-10-25 ENCOUNTER — Ambulatory Visit (INDEPENDENT_AMBULATORY_CARE_PROVIDER_SITE_OTHER): Payer: BC Managed Care – PPO | Admitting: Family Medicine

## 2015-10-25 VITALS — BP 118/78 | HR 80 | Wt 164.0 lb

## 2015-10-25 DIAGNOSIS — M5416 Radiculopathy, lumbar region: Secondary | ICD-10-CM

## 2015-10-25 MED ORDER — MELOXICAM 15 MG PO TABS: 15.0000 mg | ORAL_TABLET | Freq: Every day | ORAL | Status: DC

## 2015-10-25 MED ORDER — GABAPENTIN 100 MG PO CAPS: 200.0000 mg | ORAL_CAPSULE | Freq: Every day | ORAL | Status: DC

## 2015-10-25 NOTE — Progress Notes (Signed)
[name] [address], [place] [address] Phone: [phone] Subjective:    I'm seeing this patient by the request  of:  Renato Shin, MD   CC: low back pain Follow-up  [id number] Brenda [name] is a 65 y.o. female coming in with complaint of low back pain. Patient is had this pain intermittently for multiple years. Reviewed patient's chart she did have an MRI of the lumbar spine in 2013 that showed very minimal osteoarthritic changes. Patient did have repeat imaging done after last visit. These were independently visualized by me and showed mild progression of arthritis but still mild overall. Patient states that she was actually doing relatively well with conservative therapy. Patient states that she felt like she was doing better until yesterday. Started having severe pain again. Seems to be localized. Patient does state that when she starts walking she gets a radiation and feels like down the anterior aspect of her leg. Has always associated this with a cramp. Has noticed though that when her back hurts this seems to happen more frequently and earlier on in the walking process. Denies any numbness or weakness..    Past Medical History  Diagnosis Date  . THYROID NODULE, RIGHT 05/11/2008  . HEARING LOSS 09/15/2009     mild -no hearing aids  . OSTEOPENIA 04/20/2007  . HYPERLIPIDEMIA 04/25/2010  . ABDOMINAL/PELVIC SWELLING MASS/LUMP UNSPEC SITE 09/18/2007  . Palpitations 03/13/2008  . ACUTE URIS OF UNSPECIFIED SITE 06/06/2007  . ASTHMATIC BRONCHITIS, ACUTE 05/11/2008  . Glucose intolerance (impaired glucose tolerance)   . Anemia   . DM 09/18/2007    borderline -diet controlled- no meds  . Anxiety     no meds  . GERD (gastroesophageal reflux disease)   . Allergy    Past Surgical History  Procedure Laterality Date  . Knee surgery  1991    Left  . Left knee cyst  1985  . Hernia repair  1992  . Lymph node removal    . Dilation and curettage,  diagnostic / therapeutic    . Hammer toe surgery      RIGHT   Social History   Social History  . Marital Status: Married    Spouse Name: N/A  . Number of Children: N/A  . Years of Education: N/A   Occupational History  . [employer] History Main Topics  . Smoking status: Never Smoker   . Smokeless tobacco: Never Used  . Alcohol Use: 8.4 oz/week    14 Glasses of wine per week  . Drug Use: No  . Sexual Activity: Yes    Birth Control/ Protection: Post-menopausal   Other Topics Concern  . Not on file   Social History Narrative   Allergies  Allergen Reactions  . Hydrocodone Other (See Comments)    "makes me high"   Family History  Problem Relation Age of Onset  . Cancer Mother     Breast Cancer  . Breast cancer Mother   . Cancer Father     "Stomach" Cancer  . Stomach cancer Father   . Cancer Sister     Colon Cancer  . Breast cancer Sister   . Kidney disease Brother     Kidney Transplant  . Colon cancer Neg Hx   . Esophageal cancer Neg Hx   . Rectal cancer Neg Hx     Past medical history, social, surgical and family history all reviewed in electronic medical record.  No  pertanent information unless stated regarding to the chief complaint.   Review of Systems: No headache, visual changes, nausea, vomiting, diarrhea, constipation, dizziness, abdominal pain, skin rash, fevers, chills, night sweats, weight loss, swollen lymph nodes, body aches, joint swelling, chest pain, shortness of breath, mood changes.   Objective There were no vitals taken for this visit.  General: No apparent distress alert and oriented x3 mood and affect normal, dressed appropriately.  HEENT: Pupils equal, extraocular movements intact  Respiratory: Patient's speak in full sentences and does not appear short of breath  Cardiovascular: No lower extremity edema, non tender, no erythema  Skin: Warm dry intact with no signs of infection or rash on extremities or on axial skeleton.    Abdomen: Soft nontender  Neuro: Cranial nerves II through XII are intact, neurovascularly intact in all extremities with 2+ DTRs and 2+ pulses.  Lymph: No lymphadenopathy of posterior or anterior cervical chain or axillae bilaterally.  Gait normal with good balance and coordination.  MSK:  Non tender with full range of motion and good stability and symmetric strength and tone of shoulders, elbows, wrist, hip, knee and ankles bilaterally.  Back Exam:  Inspection: Unremarkable  Motion: Flexion 25 deg, Extension 15 deg, Side Bending to 35 deg bilaterally,  Rotation to 35 deg bilaterally same limitation in range of motion SLR laying: Negative  XSLR laying: Negative  Palpable tenderness: mild tenderness to palpation diffusely over the lumbar spine, worse over the left sign FABER: positive bilateral Sensory change: Gross sensation intact to all lumbar and sacral dermatomes.  Reflexes: 2+ at both patellar tendons, 2+ at achilles tendons, Babinski's downgoing.  Strength at foot  Symmetric strength    Impression and Recommendations:     This case required medical decision making of moderate complexity.      Note: This dictation was prepared with Dragon dictation along with smaller phrase technology. Any transcriptional errors that result from this process are unintentional.

## 2015-10-25 NOTE — Patient Instructions (Addendum)
Good to see you  Brenda [name] is your friend Continue everything else you are doing.  Meloxicam daily for 10 days then as needed Gabapentin 100mg  at night for 3 nights, then if doing well go up to 200mg  at night.  If too groggy in the morning go back to 200mg .  See me again in 3 weeks to make sure you are doing well.  If worsening we may need to consider physical therapy and some labs.

## 2015-10-25 NOTE — Assessment & Plan Note (Signed)
I do believe that the pain patient is having is more of a lumbar radiculopathy. Seems to be worse with activity and better with rest. Patient has had iron deficiency previously with a low MCV. We discussed possibly checking directly for the iron but patient declined today. We also discussed formal physical therapy for the back pain which patient declined. Patient will be put on meloxicam for short course as well as gabapentin nightly. We'll hopefully patient will respond to conservative measures. We discussed with patient to continue the home exercises which seem to be doing better until recently. Patient and will come back and see me again in 3-4 weeks. Worsening symptoms would encourage patient to significantly start formal physical therapy. If worsening symptoms down the leg using at rest or weakness patient knows to call immediately and likely advance imaging would be warranted. Spent  25 minutes with patient face-to-face and had greater than 50% of counseling including as described above in assessment and plan.

## 2015-11-16 NOTE — Progress Notes (Signed)
[name] [place], [place] [address] Phone: [phone] Subjective:    I'm seeing this patient by the request  of:  Renato Shin, MD   CC: low back pain Follow-up  [id number]  Brenda [name] is a 65 y.o. female coming in with complaint of low back pain. Patient is had this pain intermittently for multiple years. Reviewed patient's chart she did have an MRI  showed very minimal osteoarthritic changes with mild progression from 2013 imaging. These were independently visualized by me. Last visit started meloxicam and gabapentin . Patient states she is doing better overall. Patient feels like she is making progress. States that the gabapentin at night has been helpful. Did not notice any significant improvement with the anti-inflammatories. Still feels that some of her joints swell from time to time. At the moment that pain is not stopping her from any activities.    Past Medical History:  Diagnosis Date  . ABDOMINAL/PELVIC SWELLING MASS/LUMP UNSPEC SITE 09/18/2007  . ACUTE URIS OF UNSPECIFIED SITE 06/06/2007  . Allergy   . Anemia   . Anxiety    no meds  . ASTHMATIC BRONCHITIS, ACUTE 05/11/2008  . DM 09/18/2007   borderline -diet controlled- no meds  . GERD (gastroesophageal reflux disease)   . Glucose intolerance (impaired glucose tolerance)   . HEARING LOSS 09/15/2009    mild -no hearing aids  . HYPERLIPIDEMIA 04/25/2010  . OSTEOPENIA 04/20/2007  . Palpitations 03/13/2008  . THYROID NODULE, RIGHT 05/11/2008   Past Surgical History:  Procedure Laterality Date  . DILATION AND CURETTAGE, DIAGNOSTIC / THERAPEUTIC    . HAMMER TOE SURGERY     RIGHT  . HERNIA REPAIR  1992  . KNEE SURGERY  1991   Left  . Left Knee cyst  1985  . Lymph Node Removal     Social History   Social History  . Marital status: Married    Spouse name: N/A  . Number of children: N/A  . Years of education: N/A   Occupational History  . [employer]   Social History Main Topics  . Smoking status: Never Smoker  . Smokeless tobacco: Never Used  . Alcohol use 8.4 oz/week    14 Glasses of wine per week  . Drug use: No  . Sexual activity: Yes    Birth control/ protection: Post-menopausal   Other Topics Concern  . None   Social History Narrative  . None   Allergies  Allergen Reactions  . Hydrocodone Other (See Comments)    "makes me high"   Family History  Problem Relation Age of Onset  . Cancer Mother     Breast Cancer  . Breast cancer Mother   . Cancer Father     "Stomach" Cancer  . Stomach cancer Father   . Cancer Sister     Colon Cancer  . Breast cancer Sister   . Kidney disease Brother     Kidney Transplant  . Colon cancer Neg Hx   . Esophageal cancer Neg Hx   . Rectal cancer Neg Hx     Past medical history, social, surgical and family history all reviewed in electronic medical record.  No pertanent information unless stated regarding to the chief complaint.   Review of Systems: No headache, visual changes, nausea, vomiting, diarrhea, constipation, dizziness, abdominal pain, skin rash, fevers, chills, night sweats, weight loss, swollen lymph nodes, body aches, joint swelling, chest pain, shortness of breath,  mood changes.   Objective  Blood pressure 124/82, pulse 77, weight 165 lb (74.8 kg), SpO2 95 %.  General: No apparent distress alert and oriented x3 mood and affect normal, dressed appropriately.  HEENT: Pupils equal, extraocular movements intact  Respiratory: Patient's speak in full sentences and does not appear short of breath  Cardiovascular: No lower extremity edema, non tender, no erythema  Skin: Warm dry intact with no signs of infection or rash on extremities or on axial skeleton.  Abdomen: Soft nontender  Neuro: Cranial nerves II through XII are intact, neurovascularly intact in all extremities with 2+ DTRs and 2+ pulses.  Lymph: No lymphadenopathy of posterior or anterior cervical  chain or axillae bilaterally.  Gait normal with good balance and coordination.  MSK:  Non tender with full range of motion and good stability and symmetric strength and tone of shoulders, elbows, wrist, hip, knee and ankles bilaterally.  Back Exam:  Inspection: Unremarkable  Motion: Flexion 35 deg, Extension 15 deg, Side Bending to 35 deg bilaterally, Rotation to 35 deg mild improvement SLR laying: Negative  XSLR laying: Negative  Palpable tenderness: minimal tenderness noted today FABER: positive bilateral Sensory change: Gross sensation intact to all lumbar and sacral dermatomes.  Reflexes: 2+ at both patellar tendons, 2+ at achilles tendons, Babinski's downgoing.  Strength at foot  Symmetric strength Improvement from previous exam    Impression and Recommendations:     This case required medical decision making of moderate complexity.      Note: This dictation was prepared with Dragon dictation along with smaller phrase technology. Any transcriptional errors that result from this process are unintentional.

## 2015-11-17 ENCOUNTER — Encounter: Payer: Self-pay | Admitting: Family Medicine

## 2015-11-17 ENCOUNTER — Ambulatory Visit (INDEPENDENT_AMBULATORY_CARE_PROVIDER_SITE_OTHER): Payer: BC Managed Care – PPO | Admitting: Family Medicine

## 2015-11-17 VITALS — BP 124/82 | HR 77 | Wt 165.0 lb

## 2015-11-17 DIAGNOSIS — M2550 Pain in unspecified joint: Secondary | ICD-10-CM

## 2015-11-17 DIAGNOSIS — M545 Low back pain, unspecified: Secondary | ICD-10-CM

## 2015-11-17 NOTE — Patient Instructions (Signed)
Good to see you  We will get labs when you have your blood drawn with Dr. Loanne Drilling.  Keep doing everything else you are doing, I am glad we have made some improvement.  If worsening we will consider some other medicines Keep being active, your back will love that I will get a hold of you when I get the labs and discuss when to follow up

## 2015-11-17 NOTE — Assessment & Plan Note (Signed)
Patient's low back pain seems to be multifactorial. Patient is no longer having any radicular symptoms. Gabapentin seems to be beneficial. No change in medications at this time. Patient was continuing conservative therapy and decline formal physical therapy. We discussed icing regimen. Patient will come back and see me again if any exacerbation occurs otherwise will continue with conservative therapy.

## 2015-11-17 NOTE — Assessment & Plan Note (Signed)
Discussed with patient at great length. Patient would like low times for autoimmune diseases to rule out any other reason for why she has aches and pains. Patient will have a strong when she has her physical exam and we will discuss.

## 2015-11-23 ENCOUNTER — Telehealth: Payer: Self-pay | Admitting: Endocrinology

## 2015-11-23 MED ORDER — ATORVASTATIN CALCIUM 10 MG PO TABS: 10.0000 mg | ORAL_TABLET | Freq: Every day | ORAL | 2 refills | Status: DC

## 2015-11-23 NOTE — Telephone Encounter (Signed)
PT needs Lipitor sent into [facility] on [address]

## 2015-11-23 NOTE — Telephone Encounter (Signed)
Rx submitted per pt's request.  

## 2015-12-21 ENCOUNTER — Ambulatory Visit (INDEPENDENT_AMBULATORY_CARE_PROVIDER_SITE_OTHER): Payer: BC Managed Care – PPO | Admitting: Endocrinology

## 2015-12-21 ENCOUNTER — Encounter: Payer: Self-pay | Admitting: Endocrinology

## 2015-12-21 VITALS — BP 122/70 | HR 71 | Ht 65.0 in | Wt 167.0 lb

## 2015-12-21 DIAGNOSIS — Z23 Encounter for immunization: Secondary | ICD-10-CM

## 2015-12-21 DIAGNOSIS — Z0000 Encounter for general adult medical examination without abnormal findings: Secondary | ICD-10-CM | POA: Diagnosis not present

## 2015-12-21 DIAGNOSIS — R21 Rash and other nonspecific skin eruption: Secondary | ICD-10-CM

## 2015-12-21 DIAGNOSIS — M2550 Pain in unspecified joint: Secondary | ICD-10-CM | POA: Diagnosis not present

## 2015-12-21 DIAGNOSIS — E119 Type 2 diabetes mellitus without complications: Secondary | ICD-10-CM

## 2015-12-21 LAB — CBC WITH DIFFERENTIAL/PLATELET
BASOS ABS: 0 10*3/uL (ref 0.0–0.1)
Basophils Relative: 0.8 % (ref 0.0–3.0)
EOS ABS: 0.1 10*3/uL (ref 0.0–0.7)
EOS PCT: 1.8 % (ref 0.0–5.0)
HCT: 39.3 % (ref 36.0–46.0)
HEMOGLOBIN: 12.7 g/dL (ref 12.0–15.0)
Lymphocytes Relative: 28.4 % (ref 12.0–46.0)
Lymphs Abs: 1.6 10*3/uL (ref 0.7–4.0)
MCHC: 32.2 g/dL (ref 30.0–36.0)
MCV: 73 fl — ABNORMAL LOW (ref 78.0–100.0)
MONO ABS: 0.6 10*3/uL (ref 0.1–1.0)
Monocytes Relative: 10.8 % (ref 3.0–12.0)
Neutro Abs: 3.2 10*3/uL (ref 1.4–7.7)
Neutrophils Relative %: 58.2 % (ref 43.0–77.0)
Platelets: 214 10*3/uL (ref 150.0–400.0)
RBC: 5.39 Mil/uL — AB (ref 3.87–5.11)
RDW: 15.1 % (ref 11.5–15.5)
WBC: 5.5 10*3/uL (ref 4.0–10.5)

## 2015-12-21 LAB — HEPATIC FUNCTION PANEL
ALBUMIN: 4 g/dL (ref 3.5–5.2)
ALT: 18 U/L (ref 0–35)
AST: 18 U/L (ref 0–37)
Alkaline Phosphatase: 53 U/L (ref 39–117)
Bilirubin, Direct: 0.1 mg/dL (ref 0.0–0.3)
TOTAL PROTEIN: 6.8 g/dL (ref 6.0–8.3)
Total Bilirubin: 0.3 mg/dL (ref 0.2–1.2)

## 2015-12-21 LAB — BASIC METABOLIC PANEL
BUN: 21 mg/dL (ref 6–23)
CALCIUM: 8.8 mg/dL (ref 8.4–10.5)
CO2: 30 mEq/L (ref 19–32)
Chloride: 107 mEq/L (ref 96–112)
Creatinine, Ser: 0.78 mg/dL (ref 0.40–1.20)
GFR: 95.95 mL/min (ref >60.00)
Glucose, Bld: 87 mg/dL (ref 70–99)
POTASSIUM: 4.2 meq/L (ref 3.5–5.1)
SODIUM: 139 meq/L (ref 135–145)

## 2015-12-21 LAB — URINALYSIS, ROUTINE W REFLEX MICROSCOPIC
BILIRUBIN URINE: NEGATIVE
HGB URINE DIPSTICK: NEGATIVE
Ketones, ur: NEGATIVE
Leukocytes, UA: NEGATIVE
NITRITE: NEGATIVE
PH: 5.5 (ref 5.0–8.0)
RBC / HPF: NONE SEEN (ref 0–?)
Specific Gravity, Urine: 1.025 (ref 1.000–1.030)
TOTAL PROTEIN, URINE-UPE24: NEGATIVE
URINE GLUCOSE: NEGATIVE
UROBILINOGEN UA: 0.2 (ref 0.0–1.0)
WBC UA: NONE SEEN (ref 0–?)

## 2015-12-21 LAB — LIPID PANEL
Cholesterol: 150 mg/dL (ref 0–200)
HDL: 44.9 mg/dL (ref >39.00)
NONHDL: 104.63
Total CHOL/HDL Ratio: 3
Triglycerides: 246 mg/dL — ABNORMAL HIGH (ref 0.0–149.0)
VLDL: 49.2 mg/dL — AB (ref 0.0–40.0)

## 2015-12-21 LAB — C-REACTIVE PROTEIN: CRP: 0.1 mg/dL — ABNORMAL LOW (ref 0.5–20.0)

## 2015-12-21 LAB — HEMOGLOBIN A1C: HEMOGLOBIN A1C: 6.1 % (ref 4.6–6.5)

## 2015-12-21 LAB — MICROALBUMIN / CREATININE URINE RATIO
CREATININE, U: 134.8 mg/dL
Microalb Creat Ratio: 0.5 mg/g (ref 0.0–30.0)

## 2015-12-21 LAB — LDL CHOLESTEROL, DIRECT: Direct LDL: 71 mg/dL

## 2015-12-21 LAB — SEDIMENTATION RATE: Sed Rate: 8 mm/hr (ref 0–30)

## 2015-12-21 LAB — TSH: TSH: 0.79 u[IU]/mL (ref 0.35–4.50)

## 2015-12-21 MED ORDER — TRIAMCINOLONE ACETONIDE 0.1 % EX CREA: 1.0000 "application " | TOPICAL_CREAM | Freq: Four times a day (QID) | CUTANEOUS | 2 refills | Status: DC

## 2015-12-21 NOTE — Progress Notes (Signed)
Subjective:    Patient ID: Brenda [name], female    DOB: 1951/04/03, 65 y.o.   MRN: [id number]  HPI Pt is here for regular wellness examination, and is feeling pretty well in general, and says chronic med probs are stable, except as noted below Past Medical History:  Diagnosis Date  . ABDOMINAL/PELVIC SWELLING MASS/LUMP UNSPEC SITE 09/18/2007  . ACUTE URIS OF UNSPECIFIED SITE 06/06/2007  . Allergy   . Anemia   . Anxiety    no meds  . ASTHMATIC BRONCHITIS, ACUTE 05/11/2008  . DM 09/18/2007   borderline -diet controlled- no meds  . GERD (gastroesophageal reflux disease)   . Glucose intolerance (impaired glucose tolerance)   . HEARING LOSS 09/15/2009    mild -no hearing aids  . HYPERLIPIDEMIA 04/25/2010  . OSTEOPENIA 04/20/2007  . Palpitations 03/13/2008  . THYROID NODULE, RIGHT 05/11/2008    Past Surgical History:  Procedure Laterality Date  . DILATION AND CURETTAGE, DIAGNOSTIC / THERAPEUTIC    . HAMMER TOE SURGERY     RIGHT  . HERNIA REPAIR  1992  . KNEE SURGERY  1991   Left  . Left Knee cyst  1985  . Lymph Node Removal      Social History   Social History  . Marital status: Married    Spouse name: N/A  . Number of children: N/A  . Years of education: N/A   Occupational History  . [employer]   Social History Main Topics  . Smoking status: Never Smoker  . Smokeless tobacco: Never Used  . Alcohol use 8.4 oz/week    14 Glasses of wine per week  . Drug use: No  . Sexual activity: Yes    Birth control/ protection: Post-menopausal   Other Topics Concern  . Not on file   Social History Narrative  . No narrative on file    Current Outpatient Prescriptions on File Prior to Visit  Medication Sig Dispense Refill  . atorvastatin (LIPITOR) 10 MG tablet Take 1 tablet (10 mg total) by mouth daily. 30 tablet 2  . Cholecalciferol (VITAMIN D) 2000 units tablet Take 2,000 Units by mouth daily.    Marland Kitchen gabapentin (NEURONTIN) 100 MG capsule Take 2  capsules (200 mg total) by mouth at bedtime. 60 capsule 3  . hydrOXYzine (ATARAX/VISTARIL) 25 MG tablet Take by mouth.    . IRON, FERROUS GLUCONATE, PO Take 65 mg by mouth daily.    . meloxicam (MOBIC) 15 MG tablet Take 1 tablet (15 mg total) by mouth daily. 30 tablet 0  . naproxen (NAPROSYN) 500 MG tablet Take 1 tablet (500 mg total) by mouth 2 (two) times daily as needed for mild pain, moderate pain or headache (TAKE WITH MEALS.). 60 tablet 11  . omeprazole (PRILOSEC) 40 MG capsule Take 1 capsule (40 mg total) by mouth daily. 30 capsule 11  . Turmeric 500 MG TABS Take 1 tablet by mouth daily.     No current facility-administered medications on file prior to visit.     Allergies  Allergen Reactions  . Hydrocodone Other (See Comments)    "makes me high"    Family History  Problem Relation Age of Onset  . Cancer Mother     Breast Cancer  . Breast cancer Mother   . Cancer Father     "Stomach" Cancer  . Stomach cancer Father   . Cancer Sister     Colon Cancer  . Breast cancer Sister   . Kidney  disease Brother     Kidney Transplant  . Colon cancer Neg Hx   . Esophageal cancer Neg Hx   . Rectal cancer Neg Hx     BP 122/70   Pulse 71   Ht 5\' 5"  (1.651 m)   Wt 167 lb (75.8 kg)   BMI 27.79 kg/m   Review of Systems  Constitutional: Negative for fever.  HENT: Negative for hearing loss.   Eyes: Negative for visual disturbance.  Respiratory: Negative for shortness of breath.   Cardiovascular: Negative for chest pain.  Gastrointestinal: Negative for blood in stool.  Endocrine: Negative for cold intolerance.  Genitourinary: Negative for hematuria.  Musculoskeletal: Negative for gait problem.  Allergic/Immunologic: Negative for environmental allergies.  Neurological: Negative for syncope.  Hematological: Does not bruise/bleed easily.  Psychiatric/Behavioral: Negative for dysphoric mood.       Objective:   Physical Exam VS: see vs page GEN: no distress HEAD: head: no  deformity eyes: no periorbital swelling, no proptosis external nose and ears are normal mouth: no lesion seen NECK: supple, thyroid is not enlarged CHEST WALL: no deformity LUNGS:  Clear to auscultation BREASTS: sees gyn CV: reg rate and rhythm, no murmur ABD: abdomen is soft, nontender.  no hepatosplenomegaly.  not distended.  no hernia GENITALIA/RECTAL: sees gyn. MUSCULOSKELETAL: muscle bulk and and strength are grossly normal.  no obvious joint swelling.  gait is normal and steady EXTEMITIES: no deformity.  no ulcer on the feet.  feet are of normal color and temp.  no edema PULSES: dorsalis pedis intact bilat.  no carotid bruit NEURO:  cn 2-12 grossly intact.   readily moves all 4's.  sensation is intact to touch on the feet SKIN:  Normal texture and temperature.  No rash or suspicious lesion is visible.   NODES:  None palpable at the neck PSYCH: alert, well-oriented.  Does not appear anxious nor depressed.    i personally reviewed electrocardiogram tracing (today): Indication: DM Impression: normal    Assessment & Plan:  Wellness visit today, with problems stable, except as noted.   SEPARATE EVALUATION FOLLOWS--EACH PROBLEM HERE IS NEW, NOT RESPONDING TO TREATMENT, OR POSES SIGNIFICANT RISK TO THE PATIENT'S HEALTH: HISTORY OF THE PRESENT ILLNESS: Pt stares few mos of slight rash at the axillae, and assoc itching.  She is unable to cite precip factor.  No help with topical antifungal or "Gold Bond."  PAST MEDICAL HISTORY reviewed and up to date today REVIEW OF SYSTEMS: Denies open wound PHYSICAL EXAMINATION: VITAL SIGNS:  See vs page. GENERAL: no distress. Skin: moderate hyperpigmented eczematous rash at the axillae.   LAB/XRAY RESULTS: Lab Results  Component Value Date   TSH 0.79 12/21/2015  IMPRESSION: Rash, new, uncertain etiology.

## 2015-12-21 NOTE — Progress Notes (Signed)
we discussed code status.  pt requests full code, but would not want to be started or maintained on artificial life-support measures if there was not a reasonable chance of recovery 

## 2015-12-21 NOTE — Patient Instructions (Addendum)
I have sent a prescription to your pharmacy, for the rash. Please consider these measures for your health:  minimize alcohol.  Do not use tobacco products.  Have a colonoscopy at least every 10 years from age 63.  Women should have an annual mammogram from age 34.  Keep firearms safely stored.  Always use seat belts.  have working smoke alarms in your home.  See an eye doctor and dentist regularly.  Never drive under the influence of alcohol or drugs (including prescription drugs).   blood tests are requested for you today.  We'll let you know about the results. Please return in 1 year.

## 2015-12-22 DIAGNOSIS — R21 Rash and other nonspecific skin eruption: Secondary | ICD-10-CM | POA: Insufficient documentation

## 2015-12-22 LAB — HIV ANTIBODY (ROUTINE TESTING W REFLEX): HIV: NONREACTIVE

## 2015-12-22 LAB — RHEUMATOID FACTOR: Rhuematoid fact SerPl-aCnc: <14 IU/mL (ref <14)

## 2015-12-22 LAB — ANA: ANA: NEGATIVE

## 2015-12-22 LAB — HEPATITIS C ANTIBODY: HCV AB: NEGATIVE

## 2015-12-23 LAB — ANTI-DNA ANTIBODY, DOUBLE-STRANDED: ds DNA Ab: <1 IU/mL

## 2016-03-13 ENCOUNTER — Telehealth: Payer: Self-pay | Admitting: Endocrinology

## 2016-03-13 NOTE — Telephone Encounter (Signed)
Pt called in and needs her Lipitor refilled and sent to [facility] on [address]

## 2016-03-14 MED ORDER — ATORVASTATIN CALCIUM 10 MG PO TABS: 10.0000 mg | ORAL_TABLET | Freq: Every day | ORAL | 11 refills | Status: DC

## 2016-03-14 NOTE — Telephone Encounter (Signed)
Refill resubmitted per patient's request.

## 2016-08-29 ENCOUNTER — Ambulatory Visit (INDEPENDENT_AMBULATORY_CARE_PROVIDER_SITE_OTHER): Payer: BC Managed Care – PPO | Admitting: Endocrinology

## 2016-08-29 ENCOUNTER — Encounter: Payer: Self-pay | Admitting: Endocrinology

## 2016-08-29 DIAGNOSIS — J069 Acute upper respiratory infection, unspecified: Secondary | ICD-10-CM

## 2016-08-29 MED ORDER — PROMETHAZINE-DM 6.25-15 MG/5ML PO SYRP: 2.5000 mL | ORAL_SOLUTION | Freq: Four times a day (QID) | ORAL | 0 refills | Status: DC | PRN

## 2016-08-29 NOTE — Progress Notes (Signed)
Subjective:    Patient ID: Brenda [name], female    DOB: Sep 13, 1950, 66 y.o.   MRN: [id number]  HPI Pt states 10 days of slight prod-quality cough in the chest, and assoc rhinorrhea. She was seen at [facility].  She was rx'ed cough syrup and amoxil.  Since then, she feels much better.   Past Medical History:  Diagnosis Date  . ABDOMINAL/PELVIC SWELLING MASS/LUMP UNSPEC SITE 09/18/2007  . ACUTE URIS OF UNSPECIFIED SITE 06/06/2007  . Allergy   . Anemia   . Anxiety    no meds  . ASTHMATIC BRONCHITIS, ACUTE 05/11/2008  . DM 09/18/2007   borderline -diet controlled- no meds  . GERD (gastroesophageal reflux disease)   . Glucose intolerance (impaired glucose tolerance)   . HEARING LOSS 09/15/2009    mild -no hearing aids  . HYPERLIPIDEMIA 04/25/2010  . OSTEOPENIA 04/20/2007  . Palpitations 03/13/2008  . THYROID NODULE, RIGHT 05/11/2008    Past Surgical History:  Procedure Laterality Date  . DILATION AND CURETTAGE, DIAGNOSTIC / THERAPEUTIC    . HAMMER TOE SURGERY     RIGHT  . HERNIA REPAIR  1992  . KNEE SURGERY  1991   Left  . Left Knee cyst  1985  . Lymph Node Removal      Social History   Social History  . Marital status: Married    Spouse name: N/A  . Number of children: N/A  . Years of education: N/A   Occupational History  . [employer]   Social History Main Topics  . Smoking status: Never Smoker  . Smokeless tobacco: Never Used  . Alcohol use 8.4 oz/week    14 Glasses of wine per week  . Drug use: No  . Sexual activity: Yes    Birth control/ protection: Post-menopausal   Other Topics Concern  . Not on file   Social History Narrative  . No narrative on file    Current Outpatient Prescriptions on File Prior to Visit  Medication Sig Dispense Refill  . atorvastatin (LIPITOR) 10 MG tablet Take 1 tablet (10 mg total) by mouth daily. 30 tablet 11  . Cholecalciferol (VITAMIN D) 2000 units tablet Take 2,000 Units by mouth daily.    . diclofenac  sodium (VOLTAREN) 1 % GEL Apply topically 4 (four) times daily.    Marland Kitchen gabapentin (NEURONTIN) 100 MG capsule Take 2 capsules (200 mg total) by mouth at bedtime. 60 capsule 3  . hydrOXYzine (ATARAX/VISTARIL) 25 MG tablet Take by mouth.    . IRON, FERROUS GLUCONATE, PO Take 65 mg by mouth daily.    . meloxicam (MOBIC) 15 MG tablet Take 1 tablet (15 mg total) by mouth daily. 30 tablet 0  . naproxen (NAPROSYN) 500 MG tablet Take 1 tablet (500 mg total) by mouth 2 (two) times daily as needed for mild pain, moderate pain or headache (TAKE WITH MEALS.). 60 tablet 11  . omeprazole (PRILOSEC) 40 MG capsule Take 1 capsule (40 mg total) by mouth daily. 30 capsule 11  . triamcinolone cream (KENALOG) 0.1 % Apply 1 application topically 4 (four) times daily. As needed for rash or itching 80 g 2  . Turmeric 500 MG TABS Take 1 tablet by mouth daily.     No current facility-administered medications on file prior to visit.     Allergies  Allergen Reactions  . Hydrocodone Other (See Comments)    "makes me high"    Family History  Problem Relation Age of Onset  .  Cancer Mother        Breast Cancer  . Breast cancer Mother   . Cancer Father        "Stomach" Cancer  . Stomach cancer Father   . Cancer Sister        Colon Cancer  . Breast cancer Sister   . Kidney disease Brother        Kidney Transplant  . Colon cancer Neg Hx   . Esophageal cancer Neg Hx   . Rectal cancer Neg Hx     BP 126/84   Pulse 80   Temp 98.5 F (36.9 C) (Oral)   Ht 5\' 5"  (1.651 m)   Wt 161 lb (73 kg)   SpO2 96%   BMI 26.79 kg/m    Review of Systems Denies fever and wheezing.     Objective:   Physical Exam VITAL SIGNS:  See vs page GENERAL: no distress head: no deformity  eyes: no periorbital swelling, no proptosis  external nose and ears are normal  mouth: no lesion seen Both eac's and tm's are normal LUNGS:  Clear to auscultation      Assessment & Plan:  URI, new to me.  Somewhat better.   Patient  Instructions  I have sent a prescription to your pharmacy, to refill the cough syrup. Hydroxyzine helps the runny nose.  I hope you feel better soon.  If you don't feel better in 2 weeks, please call back.

## 2016-08-29 NOTE — Patient Instructions (Signed)
I have sent a prescription to your pharmacy, to refill the cough syrup. Hydroxyzine helps the runny nose.  I hope you feel better soon.  If you don't feel better in 2 weeks, please call back.

## 2016-08-30 DIAGNOSIS — J069 Acute upper respiratory infection, unspecified: Secondary | ICD-10-CM | POA: Insufficient documentation

## 2016-08-30 DIAGNOSIS — B9789 Other viral agents as the cause of diseases classified elsewhere: Secondary | ICD-10-CM | POA: Insufficient documentation

## 2016-12-20 ENCOUNTER — Encounter: Payer: Self-pay | Admitting: Endocrinology

## 2016-12-20 ENCOUNTER — Ambulatory Visit (INDEPENDENT_AMBULATORY_CARE_PROVIDER_SITE_OTHER): Payer: BC Managed Care – PPO | Admitting: Endocrinology

## 2016-12-20 VITALS — BP 118/72 | HR 93 | Wt 163.8 lb

## 2016-12-20 DIAGNOSIS — E119 Type 2 diabetes mellitus without complications: Secondary | ICD-10-CM | POA: Diagnosis not present

## 2016-12-20 DIAGNOSIS — Z0000 Encounter for general adult medical examination without abnormal findings: Secondary | ICD-10-CM

## 2016-12-20 LAB — POCT GLYCOSYLATED HEMOGLOBIN (HGB A1C): Hemoglobin A1C: 5.8

## 2016-12-20 MED ORDER — BENZONATATE 100 MG PO CAPS: 100.0000 mg | ORAL_CAPSULE | Freq: Three times a day (TID) | ORAL | 0 refills | Status: DC

## 2016-12-20 MED ORDER — CEPHALEXIN 500 MG PO CAPS: 500.0000 mg | ORAL_CAPSULE | Freq: Three times a day (TID) | ORAL | 0 refills | Status: DC

## 2016-12-20 MED ORDER — CLOTRIMAZOLE-BETAMETHASONE 1-0.05 % EX CREA: 1.0000 "application " | TOPICAL_CREAM | Freq: Three times a day (TID) | CUTANEOUS | 3 refills | Status: DC

## 2016-12-20 NOTE — Patient Instructions (Signed)
I have sent 3 prescriptions to your pharmacy: antibiotic, skin cream, and cough medicine. Please consider these measures for your health:  minimize alcohol.  Do not use tobacco products.  Have a colonoscopy at least every 10 years from age 64.  Women should have an annual mammogram from age 25.  Keep firearms safely stored.  Always use seat belts.  have working smoke alarms in your home.  See an eye doctor and dentist regularly.  Never drive under the influence of alcohol or drugs (including prescription drugs).    Please return in 1 year.

## 2016-12-20 NOTE — Progress Notes (Signed)
Subjective:    Patient ID: Brenda [name], female    DOB: 09-10-1950, 66 y.o.   MRN: [id number]  HPI Pt is here for regular wellness examination, and is feeling pretty well in general, and says chronic med probs are stable, except as noted below Past Medical History:  Diagnosis Date  . ABDOMINAL/PELVIC SWELLING MASS/LUMP UNSPEC SITE 09/18/2007  . ACUTE URIS OF UNSPECIFIED SITE 06/06/2007  . Allergy   . Anemia   . Anxiety    no meds  . ASTHMATIC BRONCHITIS, ACUTE 05/11/2008  . DM 09/18/2007   borderline -diet controlled- no meds  . GERD (gastroesophageal reflux disease)   . Glucose intolerance (impaired glucose tolerance)   . HEARING LOSS 09/15/2009    mild -no hearing aids  . HYPERLIPIDEMIA 04/25/2010  . OSTEOPENIA 04/20/2007  . Palpitations 03/13/2008  . THYROID NODULE, RIGHT 05/11/2008    Past Surgical History:  Procedure Laterality Date  . DILATION AND CURETTAGE, DIAGNOSTIC / THERAPEUTIC    . HAMMER TOE SURGERY     RIGHT  . HERNIA REPAIR  1992  . KNEE SURGERY  1991   Left  . Left Knee cyst  1985  . Lymph Node Removal      Social History   Social History  . Marital status: Married    Spouse name: N/A  . Number of children: N/A  . Years of education: N/A   Occupational History  . [employer]   Social History Main Topics  . Smoking status: Never Smoker  . Smokeless tobacco: Never Used  . Alcohol use 8.4 oz/week    14 Glasses of wine per week  . Drug use: No  . Sexual activity: Yes    Birth control/ protection: Post-menopausal   Other Topics Concern  . Not on file   Social History Narrative  . No narrative on file    Current Outpatient Prescriptions on File Prior to Visit  Medication Sig Dispense Refill  . atorvastatin (LIPITOR) 10 MG tablet Take 1 tablet (10 mg total) by mouth daily. 30 tablet 11  . Cholecalciferol (VITAMIN D) 2000 units tablet Take 2,000 Units by mouth daily.    . diclofenac sodium (VOLTAREN) 1 % GEL Apply  topically 4 (four) times daily.    Marland Kitchen gabapentin (NEURONTIN) 100 MG capsule Take 2 capsules (200 mg total) by mouth at bedtime. 60 capsule 3  . hydrOXYzine (ATARAX/VISTARIL) 25 MG tablet Take by mouth.    . IRON, FERROUS GLUCONATE, PO Take 65 mg by mouth daily.    . meloxicam (MOBIC) 15 MG tablet Take 1 tablet (15 mg total) by mouth daily. 30 tablet 0  . naproxen (NAPROSYN) 500 MG tablet Take 1 tablet (500 mg total) by mouth 2 (two) times daily as needed for mild pain, moderate pain or headache (TAKE WITH MEALS.). 60 tablet 11  . omeprazole (PRILOSEC) 40 MG capsule Take 1 capsule (40 mg total) by mouth daily. 30 capsule 11  . promethazine-dextromethorphan (PROMETHAZINE-DM) 6.25-15 MG/5ML syrup Take 2.5 mLs by mouth 4 (four) times daily as needed for cough. 240 mL 0  . triamcinolone cream (KENALOG) 0.1 % Apply 1 application topically 4 (four) times daily. As needed for rash or itching 80 g 2  . Turmeric 500 MG TABS Take 1 tablet by mouth daily.     No current facility-administered medications on file prior to visit.     Allergies  Allergen Reactions  . Hydrocodone Other (See Comments)    "makes me  high"    Family History  Problem Relation Age of Onset  . Cancer Mother        Breast Cancer  . Breast cancer Mother   . Cancer Father        "Stomach" Cancer  . Stomach cancer Father   . Cancer Sister        Colon Cancer  . Breast cancer Sister   . Kidney disease Brother        Kidney Transplant  . Colon cancer Neg Hx   . Esophageal cancer Neg Hx   . Rectal cancer Neg Hx     BP 118/72   Pulse 93   Wt 163 lb 12.8 oz (74.3 kg)   SpO2 96%   BMI 27.26 kg/m   Review of Systems Denies fatigue, visual loss, hearing loss, chest pain, sob, back pain, depression, cold intolerance, BRBPR, hematuria, syncope, numbness, allergy sxs, and rash. She has easy bruising.     Objective:   Physical Exam VS: see vs page GEN: no distress NECK: supple, thyroid is not enlarged CHEST WALL: no  deformity LUNGS:  Clear to auscultation BREASTS: sees gyn CV: reg rate and rhythm, no murmur ABD: abdomen is soft, nontender.  no hepatosplenomegaly.  not distended.  no hernia GENITALIA/RECTAL: sees gyn MUSCULOSKELETAL: muscle bulk and strength are grossly normal.  no obvious joint swelling.  gait is normal and steady EXTEMITIES: no deformity.  no ulcer on the feet.  feet are of normal color and temp.  no edema PULSES: dorsalis pedis intact bilat.  no carotid bruit NEURO:  cn 2-12 grossly intact.   readily moves all 4's.  sensation is intact to touch on the feet SKIN:  Normal texture and temperature. Not diaphoretic   NODES:  None palpable at the neck PSYCH: alert, well-oriented.  Does not appear anxious nor depressed.   I personally reviewed electrocardiogram tracing (today): Indication: wellness Impression: NSR.  No MI.  No hypertrophy. Compared to 2017: no significant change     Assessment & Plan:  Wellness visit today, with problems stable, except as noted.   SEPARATE EVALUATION FOLLOWS--EACH PROBLEM HERE IS NEW, NOT RESPONDING TO TREATMENT, OR POSES SIGNIFICANT RISK TO THE PATIENT'S HEALTH: HISTORY OF THE PRESENT ILLNESS: Pt states few days of slight pain at the throat, but no assoc fever.  PAST MEDICAL HISTORY:  Past Medical History:  Diagnosis Date  . ABDOMINAL/PELVIC SWELLING MASS/LUMP UNSPEC SITE 09/18/2007  . ACUTE URIS OF UNSPECIFIED SITE 06/06/2007  . Allergy   . Anemia   . Anxiety    no meds  . ASTHMATIC BRONCHITIS, ACUTE 05/11/2008  . DM 09/18/2007   borderline -diet controlled- no meds  . GERD (gastroesophageal reflux disease)   . Glucose intolerance (impaired glucose tolerance)   . HEARING LOSS 09/15/2009    mild -no hearing aids  . HYPERLIPIDEMIA 04/25/2010  . OSTEOPENIA 04/20/2007  . Palpitations 03/13/2008  . THYROID NODULE, RIGHT 05/11/2008    Past Surgical History:  Procedure Laterality Date  . DILATION AND CURETTAGE, DIAGNOSTIC / THERAPEUTIC    . HAMMER  TOE SURGERY     RIGHT  . HERNIA REPAIR  1992  . KNEE SURGERY  1991   Left  . Left Knee cyst  1985  . Lymph Node Removal      Social History   Social History  . Marital status: Married    Spouse name: N/A  . Number of children: N/A  . Years of education: N/A   Occupational History  .  [employer]   Social History Main Topics  . Smoking status: Never Smoker  . Smokeless tobacco: Never Used  . Alcohol use 8.4 oz/week    14 Glasses of wine per week  . Drug use: No  . Sexual activity: Yes    Birth control/ protection: Post-menopausal   Other Topics Concern  . Not on file   Social History Narrative  . No narrative on file    Current Outpatient Prescriptions on File Prior to Visit  Medication Sig Dispense Refill  . atorvastatin (LIPITOR) 10 MG tablet Take 1 tablet (10 mg total) by mouth daily. 30 tablet 11  . Cholecalciferol (VITAMIN D) 2000 units tablet Take 2,000 Units by mouth daily.    . diclofenac sodium (VOLTAREN) 1 % GEL Apply topically 4 (four) times daily.    Marland Kitchen gabapentin (NEURONTIN) 100 MG capsule Take 2 capsules (200 mg total) by mouth at bedtime. 60 capsule 3  . hydrOXYzine (ATARAX/VISTARIL) 25 MG tablet Take by mouth.    . IRON, FERROUS GLUCONATE, PO Take 65 mg by mouth daily.    . meloxicam (MOBIC) 15 MG tablet Take 1 tablet (15 mg total) by mouth daily. 30 tablet 0  . naproxen (NAPROSYN) 500 MG tablet Take 1 tablet (500 mg total) by mouth 2 (two) times daily as needed for mild pain, moderate pain or headache (TAKE WITH MEALS.). 60 tablet 11  . omeprazole (PRILOSEC) 40 MG capsule Take 1 capsule (40 mg total) by mouth daily. 30 capsule 11  . promethazine-dextromethorphan (PROMETHAZINE-DM) 6.25-15 MG/5ML syrup Take 2.5 mLs by mouth 4 (four) times daily as needed for cough. 240 mL 0  . triamcinolone cream (KENALOG) 0.1 % Apply 1 application topically 4 (four) times daily. As needed for rash or itching 80 g 2  . Turmeric 500 MG TABS Take  1 tablet by mouth daily.     No current facility-administered medications on file prior to visit.     Allergies  Allergen Reactions  . Hydrocodone Other (See Comments)    "makes me high"    Family History  Problem Relation Age of Onset  . Cancer Mother        Breast Cancer  . Breast cancer Mother   . Cancer Father        "Stomach" Cancer  . Stomach cancer Father   . Cancer Sister        Colon Cancer  . Breast cancer Sister   . Kidney disease Brother        Kidney Transplant  . Colon cancer Neg Hx   . Esophageal cancer Neg Hx   . Rectal cancer Neg Hx     BP 118/72   Pulse 93   Wt 163 lb 12.8 oz (74.3 kg)   SpO2 96%   BMI 27.26 kg/m   REVIEW OF SYSTEMS: She has a slight cough.  She has itching at both axillae.   PHYSICAL EXAMINATION: VITAL SIGNS:  See vs page GENERAL: no distress head: no deformity  eyes: no periorbital swelling, no proptosis  external nose and ears are normal  mouth: no lesion seen Both eac's and tm's are normal Skin: no rash is seen IMPRESSION: URI: new Itching, possibly allergic vs yeast PLAN:  I have sent 3 prescriptions to your pharmacy: antibiotic, skin cream, and cough medicine.

## 2016-12-26 ENCOUNTER — Ambulatory Visit (INDEPENDENT_AMBULATORY_CARE_PROVIDER_SITE_OTHER): Payer: BC Managed Care – PPO | Admitting: Endocrinology

## 2016-12-26 ENCOUNTER — Encounter: Payer: Self-pay | Admitting: Endocrinology

## 2016-12-26 DIAGNOSIS — E119 Type 2 diabetes mellitus without complications: Secondary | ICD-10-CM

## 2016-12-26 DIAGNOSIS — Z0000 Encounter for general adult medical examination without abnormal findings: Secondary | ICD-10-CM

## 2016-12-26 LAB — URINALYSIS, ROUTINE W REFLEX MICROSCOPIC
BILIRUBIN URINE: NEGATIVE
HGB URINE DIPSTICK: NEGATIVE
LEUKOCYTES UA: NEGATIVE
Nitrite: NEGATIVE
PH: 5.5 (ref 5.0–8.0)
Specific Gravity, Urine: >=1.03 — AB (ref 1.000–1.030)
TOTAL PROTEIN, URINE-UPE24: NEGATIVE
URINE GLUCOSE: 250 — AB
UROBILINOGEN UA: 0.2 (ref 0.0–1.0)
WBC UA: NONE SEEN (ref 0–?)

## 2016-12-26 LAB — CBC WITH DIFFERENTIAL/PLATELET
BASOS ABS: 0.1 10*3/uL (ref 0.0–0.1)
Basophils Relative: 1 % (ref 0.0–3.0)
EOS ABS: 0.1 10*3/uL (ref 0.0–0.7)
Eosinophils Relative: 1.8 % (ref 0.0–5.0)
HEMATOCRIT: 41.7 % (ref 36.0–46.0)
HEMOGLOBIN: 12.8 g/dL (ref 12.0–15.0)
LYMPHS PCT: 31.8 % (ref 12.0–46.0)
Lymphs Abs: 1.7 10*3/uL (ref 0.7–4.0)
MCHC: 30.7 g/dL (ref 30.0–36.0)
MCV: 74.4 fl — ABNORMAL LOW (ref 78.0–100.0)
MONO ABS: 0.7 10*3/uL (ref 0.1–1.0)
MONOS PCT: 13.8 % — AB (ref 3.0–12.0)
Neutro Abs: 2.7 10*3/uL (ref 1.4–7.7)
Neutrophils Relative %: 51.6 % (ref 43.0–77.0)
Platelets: 211 10*3/uL (ref 150.0–400.0)
RBC: 5.61 Mil/uL — ABNORMAL HIGH (ref 3.87–5.11)
RDW: 15 % (ref 11.5–15.5)
WBC: 5.3 10*3/uL (ref 4.0–10.5)

## 2016-12-26 LAB — HEPATIC FUNCTION PANEL
ALBUMIN: 4.1 g/dL (ref 3.5–5.2)
ALT: 17 U/L (ref 0–35)
AST: 14 U/L (ref 0–37)
Alkaline Phosphatase: 61 U/L (ref 39–117)
Bilirubin, Direct: 0.1 mg/dL (ref 0.0–0.3)
TOTAL PROTEIN: 6.9 g/dL (ref 6.0–8.3)
Total Bilirubin: 0.3 mg/dL (ref 0.2–1.2)

## 2016-12-26 LAB — BASIC METABOLIC PANEL
BUN: 25 mg/dL — ABNORMAL HIGH (ref 6–23)
CALCIUM: 9.1 mg/dL (ref 8.4–10.5)
CO2: 29 meq/L (ref 19–32)
CREATININE: 0.67 mg/dL (ref 0.40–1.20)
Chloride: 106 mEq/L (ref 96–112)
GFR: 113.98 mL/min (ref >60.00)
GLUCOSE: 75 mg/dL (ref 70–99)
Potassium: 3.5 mEq/L (ref 3.5–5.1)
SODIUM: 139 meq/L (ref 135–145)

## 2016-12-26 LAB — LIPID PANEL
CHOL/HDL RATIO: 3
Cholesterol: 125 mg/dL (ref 0–200)
HDL: 49.5 mg/dL (ref >39.00)
LDL Cholesterol: 44 mg/dL (ref 0–99)
NONHDL: 75.52
Triglycerides: 156 mg/dL — ABNORMAL HIGH (ref 0.0–149.0)
VLDL: 31.2 mg/dL (ref 0.0–40.0)

## 2016-12-26 LAB — MICROALBUMIN / CREATININE URINE RATIO
Creatinine,U: 145.7 mg/dL
MICROALB UR: 0.9 mg/dL (ref 0.0–1.9)
Microalb Creat Ratio: 0.6 mg/g (ref 0.0–30.0)

## 2016-12-26 LAB — TSH: TSH: 0.95 u[IU]/mL (ref 0.35–4.50)

## 2017-01-22 ENCOUNTER — Encounter: Payer: Self-pay | Admitting: Endocrinology

## 2017-01-22 ENCOUNTER — Other Ambulatory Visit: Payer: Self-pay

## 2017-01-22 ENCOUNTER — Ambulatory Visit (INDEPENDENT_AMBULATORY_CARE_PROVIDER_SITE_OTHER): Payer: BC Managed Care – PPO | Admitting: Endocrinology

## 2017-01-22 ENCOUNTER — Ambulatory Visit
Admission: RE | Admit: 2017-01-22 | Discharge: 2017-01-22 | Disposition: A | Discharge status: Home / Self Care | Payer: BC Managed Care – PPO | Source: Ambulatory Visit | Attending: Endocrinology | Admitting: Endocrinology

## 2017-01-22 DIAGNOSIS — Z23 Encounter for immunization: Secondary | ICD-10-CM | POA: Diagnosis not present

## 2017-01-22 DIAGNOSIS — R051 Acute cough: Secondary | ICD-10-CM | POA: Insufficient documentation

## 2017-01-22 DIAGNOSIS — R059 Cough, unspecified: Secondary | ICD-10-CM | POA: Insufficient documentation

## 2017-01-22 DIAGNOSIS — R05 Cough: Secondary | ICD-10-CM

## 2017-01-22 MED ORDER — PROMETHAZINE-DM 6.25-15 MG/5ML PO SYRP: 2.5000 mL | ORAL_SOLUTION | Freq: Four times a day (QID) | ORAL | 0 refills | Status: DC | PRN

## 2017-01-22 MED ORDER — AZITHROMYCIN 500 MG PO TABS: 500.0000 mg | ORAL_TABLET | Freq: Every day | ORAL | 0 refills | Status: DC

## 2017-01-22 MED ORDER — FLUTICASONE-SALMETEROL 100-50 MCG/DOSE IN AEPB: 1.0000 | INHALATION_SPRAY | Freq: Two times a day (BID) | RESPIRATORY_TRACT | 0 refills | Status: DC

## 2017-01-22 NOTE — Progress Notes (Signed)
Subjective:    Patient ID: Brenda [name], female    DOB: [birth year], 66 y.o.   MRN: [id number]  HPI Pt states 1 week of moderate prod-quality cough in the chest, and assoc wheezing.  She also has nasal congestion.  Past Medical History:  Diagnosis Date  . ABDOMINAL/PELVIC SWELLING MASS/LUMP UNSPEC SITE 09/18/2007  . ACUTE URIS OF UNSPECIFIED SITE 06/06/2007  . Allergy   . Anemia   . Anxiety    no meds  . ASTHMATIC BRONCHITIS, ACUTE 05/11/2008  . DM 09/18/2007   borderline -diet controlled- no meds  . GERD (gastroesophageal reflux disease)   . Glucose intolerance (impaired glucose tolerance)   . HEARING LOSS 09/15/2009    mild -no hearing aids  . HYPERLIPIDEMIA 04/25/2010  . OSTEOPENIA 04/20/2007  . Palpitations 03/13/2008  . THYROID NODULE, RIGHT 05/11/2008    Past Surgical History:  Procedure Laterality Date  . DILATION AND CURETTAGE, DIAGNOSTIC / THERAPEUTIC    . HAMMER TOE SURGERY     RIGHT  . HERNIA REPAIR  1992  . KNEE SURGERY  1991   Left  . Left Knee cyst  1985  . Lymph Node Removal      Social History   Social History  . Marital status: Married    Spouse name: N/A  . Number of children: N/A  . Years of education: N/A   Occupational History  . [employer]   Social History Main Topics  . Smoking status: Never Smoker  . Smokeless tobacco: Never Used  . Alcohol use 8.4 oz/week    14 Glasses of wine per week  . Drug use: No  . Sexual activity: Yes    Birth control/ protection: Post-menopausal   Other Topics Concern  . Not on file   Social History Narrative  . No narrative on file    Current Outpatient Prescriptions on File Prior to Visit  Medication Sig Dispense Refill  . atorvastatin (LIPITOR) 10 MG tablet Take 1 tablet (10 mg total) by mouth daily. 30 tablet 11  . benzonatate (TESSALON) 100 MG capsule Take 1 capsule (100 mg total) by mouth 3 (three) times daily. 30 capsule 0  . Cholecalciferol (VITAMIN D) 2000 units tablet  Take 2,000 Units by mouth daily.    . clotrimazole-betamethasone (LOTRISONE) cream Apply 1 application topically 3 (three) times daily. As needed for rash 45 g 3  . diclofenac sodium (VOLTAREN) 1 % GEL Apply topically 4 (four) times daily.    Marland Kitchen gabapentin (NEURONTIN) 100 MG capsule Take 2 capsules (200 mg total) by mouth at bedtime. 60 capsule 3  . hydrOXYzine (ATARAX/VISTARIL) 25 MG tablet Take by mouth.    . IRON, FERROUS GLUCONATE, PO Take 65 mg by mouth daily.    . meloxicam (MOBIC) 15 MG tablet Take 1 tablet (15 mg total) by mouth daily. (Patient not taking: Reported on 12/26/2016) 30 tablet 0  . naproxen (NAPROSYN) 500 MG tablet Take 1 tablet (500 mg total) by mouth 2 (two) times daily as needed for mild pain, moderate pain or headache (TAKE WITH MEALS.). 60 tablet 11  . omeprazole (PRILOSEC) 40 MG capsule Take 1 capsule (40 mg total) by mouth daily. 30 capsule 11  . triamcinolone cream (KENALOG) 0.1 % Apply 1 application topically 4 (four) times daily. As needed for rash or itching 80 g 2  . Turmeric 500 MG TABS Take 1 tablet by mouth daily.     No current facility-administered medications on file prior  to visit.     Allergies  Allergen Reactions  . Hydrocodone Other (See Comments)    "makes me high"    Family History  Problem Relation Age of Onset  . Cancer Mother        Breast Cancer  . Breast cancer Mother   . Cancer Father        "Stomach" Cancer  . Stomach cancer Father   . Cancer Sister        Colon Cancer  . Breast cancer Sister   . Kidney disease Brother        Kidney Transplant  . Colon cancer Neg Hx   . Esophageal cancer Neg Hx   . Rectal cancer Neg Hx     BP 122/64   Pulse (!) 101   Wt 161 lb 9.6 oz (73.3 kg)   SpO2 95%   BMI 26.89 kg/m    Review of Systems Ever is resolved.  No earache.      Objective:   Physical Exam VITAL SIGNS:  See vs page GENERAL: no distress head: no deformity  eyes: no periorbital swelling, no proptosis.   external nose  and ears are normal.   mouth: no lesion seen Right tm is very red.  Left is normal.   LUNGS:  Clear to auscultation.        Assessment & Plan:  AOM: new Wheezing: uncertain if bronchial vs upper airway.   Patient Instructions  A chest x-ray is requested for you today.  We'll let you know about the results. Here is a refill of the cough syrup Loratadine-d (non-prescription) will help your congestion. I have sent 2 prescription to your pharmacy" inhaler and antibiotic I hope you feel better soon.  If you don't feel better by next week, please call back.  Please call sooner if you get worse.

## 2017-01-22 NOTE — Patient Instructions (Signed)
A chest x-ray is requested for you today.  We'll let you know about the results. Here is a refill of the cough syrup Loratadine-d (non-prescription) will help your congestion. I have sent 2 prescription to your pharmacy" inhaler and antibiotic I hope you feel better soon.  If you don't feel better by next week, please call back.  Please call sooner if you get worse.

## 2017-03-05 ENCOUNTER — Telehealth: Payer: Self-pay | Admitting: Endocrinology

## 2017-03-05 ENCOUNTER — Other Ambulatory Visit: Payer: Self-pay

## 2017-03-05 MED ORDER — ATORVASTATIN CALCIUM 10 MG PO TABS: 10.0000 mg | ORAL_TABLET | Freq: Every day | ORAL | 0 refills | Status: DC

## 2017-03-05 NOTE — Telephone Encounter (Signed)
Please call in the lipitor to [facility] [address] one year supply

## 2017-03-05 NOTE — Telephone Encounter (Signed)
Done

## 2017-03-13 ENCOUNTER — Other Ambulatory Visit: Payer: Self-pay | Admitting: Obstetrics and Gynecology

## 2017-03-13 DIAGNOSIS — Z803 Family history of malignant neoplasm of breast: Secondary | ICD-10-CM

## 2017-04-11 ENCOUNTER — Ambulatory Visit
Admission: RE | Admit: 2017-04-11 | Discharge: 2017-04-11 | Disposition: A | Discharge status: Home / Self Care | Payer: BC Managed Care – PPO | Source: Ambulatory Visit | Attending: Obstetrics and Gynecology | Admitting: Obstetrics and Gynecology

## 2017-04-11 DIAGNOSIS — Z803 Family history of malignant neoplasm of breast: Secondary | ICD-10-CM

## 2017-04-11 MED ORDER — GADOBENATE DIMEGLUMINE 529 MG/ML IV SOLN
15.0000 mL | Freq: Once | INTRAVENOUS | Status: AC | PRN
  Administered 2017-04-11: 15 mL via INTRAVENOUS

## 2017-04-16 ENCOUNTER — Other Ambulatory Visit: Payer: Self-pay | Admitting: Obstetrics and Gynecology

## 2017-04-16 DIAGNOSIS — R9389 Abnormal findings on diagnostic imaging of other specified body structures: Secondary | ICD-10-CM

## 2017-04-18 ENCOUNTER — Ambulatory Visit
Admission: RE | Admit: 2017-04-18 | Discharge: 2017-04-18 | Disposition: A | Discharge status: Home / Self Care | Payer: BC Managed Care – PPO | Source: Ambulatory Visit | Attending: Obstetrics and Gynecology | Admitting: Obstetrics and Gynecology

## 2017-04-18 DIAGNOSIS — R9389 Abnormal findings on diagnostic imaging of other specified body structures: Secondary | ICD-10-CM

## 2017-04-19 ENCOUNTER — Ambulatory Visit
Admission: RE | Admit: 2017-04-19 | Discharge: 2017-04-19 | Disposition: A | Discharge status: Home / Self Care | Payer: BC Managed Care – PPO | Source: Ambulatory Visit | Attending: Obstetrics and Gynecology | Admitting: Obstetrics and Gynecology

## 2017-04-19 ENCOUNTER — Other Ambulatory Visit: Payer: Self-pay | Admitting: Obstetrics and Gynecology

## 2017-04-19 DIAGNOSIS — R928 Other abnormal and inconclusive findings on diagnostic imaging of breast: Secondary | ICD-10-CM

## 2017-04-19 DIAGNOSIS — R9389 Abnormal findings on diagnostic imaging of other specified body structures: Secondary | ICD-10-CM

## 2017-04-19 MED ORDER — GADOBENATE DIMEGLUMINE 529 MG/ML IV SOLN
15.0000 mL | Freq: Once | INTRAVENOUS | Status: AC | PRN
  Administered 2017-04-19: 15 mL via INTRAVENOUS

## 2017-04-20 ENCOUNTER — Other Ambulatory Visit: Payer: BC Managed Care – PPO

## 2017-06-15 ENCOUNTER — Ambulatory Visit (INDEPENDENT_AMBULATORY_CARE_PROVIDER_SITE_OTHER): Payer: BC Managed Care – PPO | Admitting: Endocrinology

## 2017-06-15 ENCOUNTER — Encounter: Payer: Self-pay | Admitting: Endocrinology

## 2017-06-15 DIAGNOSIS — R21 Rash and other nonspecific skin eruption: Secondary | ICD-10-CM | POA: Diagnosis not present

## 2017-06-15 MED ORDER — TRIAMCINOLONE ACETONIDE 0.1 % EX CREA: 1.0000 "application " | TOPICAL_CREAM | Freq: Four times a day (QID) | CUTANEOUS | 2 refills | Status: DC

## 2017-06-15 MED ORDER — HYDROXYZINE HCL 25 MG PO TABS: 25.0000 mg | ORAL_TABLET | Freq: Three times a day (TID) | ORAL | 3 refills | Status: DC | PRN

## 2017-06-15 NOTE — Progress Notes (Signed)
Subjective:    Patient ID: Brenda [name], female    DOB: June 20, 1950, 67 y.o.   MRN: [id number]  HPI Pt states few mos of moderate cracking at the corners of the mouth, and assoc pain.   Past Medical History:  Diagnosis Date  . ABDOMINAL/PELVIC SWELLING MASS/LUMP UNSPEC SITE 09/18/2007  . ACUTE URIS OF UNSPECIFIED SITE 06/06/2007  . Allergy   . Anemia   . Anxiety    no meds  . ASTHMATIC BRONCHITIS, ACUTE 05/11/2008  . DM 09/18/2007   borderline -diet controlled- no meds  . GERD (gastroesophageal reflux disease)   . Glucose intolerance (impaired glucose tolerance)   . HEARING LOSS 09/15/2009    mild -no hearing aids  . HYPERLIPIDEMIA 04/25/2010  . OSTEOPENIA 04/20/2007  . Palpitations 03/13/2008  . THYROID NODULE, RIGHT 05/11/2008    Past Surgical History:  Procedure Laterality Date  . DILATION AND CURETTAGE, DIAGNOSTIC / THERAPEUTIC    . HAMMER TOE SURGERY     RIGHT  . HERNIA REPAIR  1992  . KNEE SURGERY  1991   Left  . Left Knee cyst  1985  . Lymph Node Removal      Social History   Socioeconomic History  . Marital status: Married    Spouse name: Not on file  . Number of children: Not on file  . Years of education: Not on file  . Highest education level: Not on file  Social Needs  . Financial resource strain: Not on file  . Food insecurity - worry: Not on file  . Food insecurity - inability: Not on file  . Transportation needs - medical: Not on file  . Transportation needs - non-medical: Not on file  Occupational History  . Occupation: Nurse, adult: [employer]  Tobacco Use  . Smoking status: Never Smoker  . Smokeless tobacco: Never Used  Substance and Sexual Activity  . Alcohol use: Yes    Alcohol/week: 8.4 oz    Types: 14 Glasses of wine per week  . Drug use: No  . Sexual activity: Yes    Birth control/protection: Post-menopausal  Other Topics Concern  . Not on file  Social History Narrative  . Not on file    Current  Outpatient Medications on File Prior to Visit  Medication Sig Dispense Refill  . atorvastatin (LIPITOR) 10 MG tablet Take 1 tablet (10 mg total) by mouth daily. 365 tablet 0  . Cholecalciferol (VITAMIN D) 2000 units tablet Take 2,000 Units by mouth daily.    Marland Kitchen gabapentin (NEURONTIN) 100 MG capsule Take 2 capsules (200 mg total) by mouth at bedtime. 60 capsule 3  . IRON, FERROUS GLUCONATE, PO Take 65 mg by mouth daily.    . naproxen (NAPROSYN) 500 MG tablet Take 1 tablet (500 mg total) by mouth 2 (two) times daily as needed for mild pain, moderate pain or headache (TAKE WITH MEALS.). 60 tablet 11  . Turmeric 500 MG TABS Take 1 tablet by mouth daily.     No current facility-administered medications on file prior to visit.     Allergies  Allergen Reactions  . Hydrocodone Other (See Comments)    "makes me high"    Family History  Problem Relation Age of Onset  . Cancer Mother        Breast Cancer  . Breast cancer Mother   . Cancer Father        "Stomach" Cancer  . Stomach cancer Father   .  Cancer Sister        Colon Cancer  . Breast cancer Sister   . Kidney disease Brother        Kidney Transplant  . Colon cancer Neg Hx   . Esophageal cancer Neg Hx   . Rectal cancer Neg Hx     BP 110/72 (BP Location: Left Arm, Patient Position: Sitting, Cuff Size: Normal)   Pulse 94   Wt 168 lb 3.2 oz (76.3 kg)   SpO2 95%   BMI 27.99 kg/m    Review of Systems Denies oral ulcer and fever.      Objective:   Physical Exam VITAL SIGNS:  See vs page GENERAL: no distress corners of the mouth: slight eczematous change.        Assessment & Plan:  Rash, new, uncertain etiology  Patient Instructions  I have sent a prescription to your pharmacy, for the skin cracking.

## 2017-06-15 NOTE — Patient Instructions (Addendum)
I have sent a prescription to your pharmacy, for the skin cracking.

## 2017-07-18 ENCOUNTER — Ambulatory Visit (INDEPENDENT_AMBULATORY_CARE_PROVIDER_SITE_OTHER): Payer: BC Managed Care – PPO | Admitting: Endocrinology

## 2017-07-18 ENCOUNTER — Encounter: Payer: Self-pay | Admitting: Endocrinology

## 2017-07-18 DIAGNOSIS — H6690 Otitis media, unspecified, unspecified ear: Secondary | ICD-10-CM

## 2017-07-18 MED ORDER — PROMETHAZINE-CODEINE 6.25-10 MG/5ML PO SYRP: 5.0000 mL | ORAL_SOLUTION | ORAL | 0 refills | Status: DC | PRN

## 2017-07-18 MED ORDER — AZITHROMYCIN 500 MG PO TABS: 500.0000 mg | ORAL_TABLET | Freq: Every day | ORAL | 0 refills | Status: DC

## 2017-07-18 NOTE — Patient Instructions (Addendum)
I have sent 2 prescriptions to your pharmacy, for an antibiotic pill, and for cough syrup.   Loratadine-d (non-prescription) will help your congestion.   I hope you feel better soon.  If you don't feel better by next week, please call back.  Please call sooner if you get worse.

## 2017-07-18 NOTE — Progress Notes (Signed)
Subjective:    Patient ID: Brenda [name], female    DOB: [birth year], 67 y.o.   MRN: [id number]  HPI 3 days of moderate prod-quality cough in the chest, and assoc myalgias.  She has nasal congestion.   Past Medical History:  Diagnosis Date  . ABDOMINAL/PELVIC SWELLING MASS/LUMP UNSPEC SITE 09/18/2007  . ACUTE URIS OF UNSPECIFIED SITE 06/06/2007  . Allergy   . Anemia   . Anxiety    no meds  . ASTHMATIC BRONCHITIS, ACUTE 05/11/2008  . DM 09/18/2007   borderline -diet controlled- no meds  . GERD (gastroesophageal reflux disease)   . Glucose intolerance (impaired glucose tolerance)   . HEARING LOSS 09/15/2009    mild -no hearing aids  . HYPERLIPIDEMIA 04/25/2010  . OSTEOPENIA 04/20/2007  . Palpitations 03/13/2008  . THYROID NODULE, RIGHT 05/11/2008    Past Surgical History:  Procedure Laterality Date  . DILATION AND CURETTAGE, DIAGNOSTIC / THERAPEUTIC    . HAMMER TOE SURGERY     RIGHT  . HERNIA REPAIR  1992  . KNEE SURGERY  1991   Left  . Left Knee cyst  1985  . Lymph Node Removal      Social History   Socioeconomic History  . Marital status: Married    Spouse name: Not on file  . Number of children: Not on file  . Years of education: Not on file  . Highest education level: Not on file  Occupational History  . Occupation: Nurse, adult: [employer]  Social Needs  . Financial resource strain: Not on file  . Food insecurity:    Worry: Not on file    Inability: Not on file  . Transportation needs:    Medical: Not on file    Non-medical: Not on file  Tobacco Use  . Smoking status: Never Smoker  . Smokeless tobacco: Never Used  Substance and Sexual Activity  . Alcohol use: Yes    Alcohol/week: 8.4 oz    Types: 14 Glasses of wine per week  . Drug use: No  . Sexual activity: Yes    Birth control/protection: Post-menopausal  Lifestyle  . Physical activity:    Days per week: Not on file    Minutes per session: Not on file  . Stress: Not on  file  Relationships  . Social connections:    Talks on phone: Not on file    Gets together: Not on file    Attends religious service: Not on file    Active member of club or organization: Not on file    Attends meetings of clubs or organizations: Not on file    Relationship status: Not on file  . Intimate partner violence:    Fear of current or ex partner: Not on file    Emotionally abused: Not on file    Physically abused: Not on file    Forced sexual activity: Not on file  Other Topics Concern  . Not on file  Social History Narrative  . Not on file    Current Outpatient Medications on File Prior to Visit  Medication Sig Dispense Refill  . atorvastatin (LIPITOR) 10 MG tablet Take 1 tablet (10 mg total) by mouth daily. 365 tablet 0  . Cholecalciferol (VITAMIN D) 2000 units tablet Take 2,000 Units by mouth daily.    Marland Kitchen gabapentin (NEURONTIN) 100 MG capsule Take 2 capsules (200 mg total) by mouth at bedtime. 60 capsule 3  . hydrOXYzine (ATARAX/VISTARIL) 25 MG  tablet Take 1 tablet (25 mg total) by mouth every 8 (eight) hours as needed. 90 tablet 3  . IRON, FERROUS GLUCONATE, PO Take 65 mg by mouth daily.    . naproxen (NAPROSYN) 500 MG tablet Take 1 tablet (500 mg total) by mouth 2 (two) times daily as needed for mild pain, moderate pain or headache (TAKE WITH MEALS.). 60 tablet 11  . triamcinolone cream (KENALOG) 0.1 % Apply 1 application topically 4 (four) times daily. As needed for rash or itching 30 g 2  . Turmeric 500 MG TABS Take 1 tablet by mouth daily.     No current facility-administered medications on file prior to visit.     Allergies  Allergen Reactions  . Hydrocodone Other (See Comments)    drowsiness    Family History  Problem Relation Age of Onset  . Cancer Mother        Breast Cancer  . Breast cancer Mother   . Cancer Father        "Stomach" Cancer  . Stomach cancer Father   . Cancer Sister        Colon Cancer  . Breast cancer Sister   . Kidney disease  Brother        Kidney Transplant  . Colon cancer Neg Hx   . Esophageal cancer Neg Hx   . Rectal cancer Neg Hx     BP 120/76 (BP Location: Left Arm, Patient Position: Sitting, Cuff Size: Normal)   Pulse 98   Wt 165 lb 6.4 oz (75 kg)   SpO2 94%   BMI 27.52 kg/m    Review of Systems Denies wheezing, earache, and sob.  Fever is resolved.      Objective:   Physical Exam VITAL SIGNS:  See vs page GENERAL: no distress head: no deformity  eyes: no periorbital swelling, no proptosis  external nose and ears are normal  mouth: no lesion seen Both tm's are red (R>L) LUNGS:  Clear to auscultation.        Assessment & Plan:  AOM, new.    Patient Instructions  I have sent 2 prescriptions to your pharmacy, for an antibiotic pill, and for cough syrup.   Loratadine-d (non-prescription) will help your congestion.   I hope you feel better soon.  If you don't feel better by next week, please call back.  Please call sooner if you get worse.

## 2017-07-20 DIAGNOSIS — H6690 Otitis media, unspecified, unspecified ear: Secondary | ICD-10-CM | POA: Insufficient documentation

## 2017-08-15 ENCOUNTER — Ambulatory Visit
Admission: RE | Admit: 2017-08-15 | Discharge: 2017-08-15 | Disposition: A | Discharge status: Home / Self Care | Payer: BC Managed Care – PPO | Source: Ambulatory Visit | Attending: Endocrinology | Admitting: Endocrinology

## 2017-08-15 ENCOUNTER — Telehealth: Payer: Self-pay | Admitting: Endocrinology

## 2017-08-15 ENCOUNTER — Ambulatory Visit (INDEPENDENT_AMBULATORY_CARE_PROVIDER_SITE_OTHER): Payer: BC Managed Care – PPO | Admitting: Endocrinology

## 2017-08-15 ENCOUNTER — Encounter: Payer: Self-pay | Admitting: Endocrinology

## 2017-08-15 DIAGNOSIS — S20212A Contusion of left front wall of thorax, initial encounter: Secondary | ICD-10-CM

## 2017-08-15 NOTE — Telephone Encounter (Signed)
I called & notified patient that x-ray was normal.

## 2017-08-15 NOTE — Patient Instructions (Signed)
A chest x-ray is requested for you today.  We'll let you know about the results.  I hope you feel better soon.  If you don't feel better by next week, please call back.  Please call sooner if you get worse.

## 2017-08-15 NOTE — Progress Notes (Signed)
Subjective:    Patient ID: Brenda [name], female    DOB: [birth year], 66 y.o.   MRN: [id number]  HPI Yesterday, pt fell off yoga ball, striking her left lower post chest wall, on the arm of a sofa.  Since then, she has moderate pain there, but no assoc numbness of the feet.  No sob or cough.   Past Medical History:  Diagnosis Date  . ABDOMINAL/PELVIC SWELLING MASS/LUMP UNSPEC SITE 09/18/2007  . ACUTE URIS OF UNSPECIFIED SITE 06/06/2007  . Allergy   . Anemia   . Anxiety    no meds  . ASTHMATIC BRONCHITIS, ACUTE 05/11/2008  . DM 09/18/2007   borderline -diet controlled- no meds  . GERD (gastroesophageal reflux disease)   . Glucose intolerance (impaired glucose tolerance)   . HEARING LOSS 09/15/2009    mild -no hearing aids  . HYPERLIPIDEMIA 04/25/2010  . OSTEOPENIA 04/20/2007  . Palpitations 03/13/2008  . THYROID NODULE, RIGHT 05/11/2008    Past Surgical History:  Procedure Laterality Date  . DILATION AND CURETTAGE, DIAGNOSTIC / THERAPEUTIC    . HAMMER TOE SURGERY     RIGHT  . HERNIA REPAIR  1992  . KNEE SURGERY  1991   Left  . Left Knee cyst  1985  . Lymph Node Removal      Social History   Socioeconomic History  . Marital status: Married    Spouse name: Not on file  . Number of children: Not on file  . Years of education: Not on file  . Highest education level: Not on file  Occupational History  . Occupation: Nurse, adult: [employer]  Social Needs  . Financial resource strain: Not on file  . Food insecurity:    Worry: Not on file    Inability: Not on file  . Transportation needs:    Medical: Not on file    Non-medical: Not on file  Tobacco Use  . Smoking status: Never Smoker  . Smokeless tobacco: Never Used  Substance and Sexual Activity  . Alcohol use: Yes    Alcohol/week: 8.4 oz    Types: 14 Glasses of wine per week  . Drug use: No  . Sexual activity: Yes    Birth control/protection: Post-menopausal  Lifestyle  . Physical  activity:    Days per week: Not on file    Minutes per session: Not on file  . Stress: Not on file  Relationships  . Social connections:    Talks on phone: Not on file    Gets together: Not on file    Attends religious service: Not on file    Active member of club or organization: Not on file    Attends meetings of clubs or organizations: Not on file    Relationship status: Not on file  . Intimate partner violence:    Fear of current or ex partner: Not on file    Emotionally abused: Not on file    Physically abused: Not on file    Forced sexual activity: Not on file  Other Topics Concern  . Not on file  Social History Narrative  . Not on file    Current Outpatient Medications on File Prior to Visit  Medication Sig Dispense Refill  . atorvastatin (LIPITOR) 10 MG tablet Take 1 tablet (10 mg total) by mouth daily. 365 tablet 0  . Cholecalciferol (VITAMIN D) 2000 units tablet Take 2,000 Units by mouth daily.    Marland Kitchen gabapentin (NEURONTIN)  100 MG capsule Take 2 capsules (200 mg total) by mouth at bedtime. 60 capsule 3  . hydrOXYzine (ATARAX/VISTARIL) 25 MG tablet Take 1 tablet (25 mg total) by mouth every 8 (eight) hours as needed. 90 tablet 3  . IRON, FERROUS GLUCONATE, PO Take 65 mg by mouth daily.    Marland Kitchen triamcinolone cream (KENALOG) 0.1 % Apply 1 application topically 4 (four) times daily. As needed for rash or itching 30 g 2  . Turmeric 500 MG TABS Take 1 tablet by mouth daily.     No current facility-administered medications on file prior to visit.     Allergies  Allergen Reactions  . Hydrocodone Other (See Comments)    drowsiness    Family History  Problem Relation Age of Onset  . Cancer Mother        Breast Cancer  . Breast cancer Mother   . Cancer Father        "Stomach" Cancer  . Stomach cancer Father   . Cancer Sister        Colon Cancer  . Breast cancer Sister   . Kidney disease Brother        Kidney Transplant  . Colon cancer Neg Hx   . Esophageal cancer Neg  Hx   . Rectal cancer Neg Hx     BP 116/68   Pulse 81   Temp 98.2 F (36.8 C)   Wt 166 lb (75.3 kg)   SpO2 96%   BMI 27.62 kg/m    Review of Systems Denies bowel or bladder retention.      Objective:   Physical Exam VITAL SIGNS:  See vs page GENERAL: no distress Chest wall: nontender LUNGS:  Clear to auscultation.  Gait: normal and steady.      Assessment & Plan:  Chest wall contusion, new.  Please continue the same chronic pain medication.    Patient Instructions  A chest x-ray is requested for you today.  We'll let you know about the results.  I hope you feel better soon.  If you don't feel better by next week, please call back.  Please call sooner if you get worse.

## 2017-08-15 NOTE — Telephone Encounter (Signed)
Patient is calling to find out results from chest xray she had done.   Please advise

## 2017-08-17 ENCOUNTER — Encounter: Payer: Self-pay | Admitting: Endocrinology

## 2017-08-17 ENCOUNTER — Ambulatory Visit (INDEPENDENT_AMBULATORY_CARE_PROVIDER_SITE_OTHER): Payer: BC Managed Care – PPO | Admitting: Endocrinology

## 2017-08-17 VITALS — BP 138/68 | HR 82 | Wt 166.6 lb

## 2017-08-17 DIAGNOSIS — S20212A Contusion of left front wall of thorax, initial encounter: Secondary | ICD-10-CM

## 2017-08-17 MED ORDER — HYDROCODONE-ACETAMINOPHEN 5-325 MG PO TABS: 0.5000 | ORAL_TABLET | Freq: Four times a day (QID) | ORAL | 0 refills | Status: DC | PRN

## 2017-08-17 NOTE — Progress Notes (Signed)
Subjective:    Patient ID: Brenda [name], female    DOB: 1950/09/08, 67 y.o.   MRN: [id number]  HPI 3 days ago, pt fell off yoga ball, striking her left lower post chest wall, on the arm of a sofa.  Since she was seen for this, chest wall pain has not improved.   Past Medical History:  Diagnosis Date  . ABDOMINAL/PELVIC SWELLING MASS/LUMP UNSPEC SITE 09/18/2007  . ACUTE URIS OF UNSPECIFIED SITE 06/06/2007  . Allergy   . Anemia   . Anxiety    no meds  . ASTHMATIC BRONCHITIS, ACUTE 05/11/2008  . DM 09/18/2007   borderline -diet controlled- no meds  . GERD (gastroesophageal reflux disease)   . Glucose intolerance (impaired glucose tolerance)   . HEARING LOSS 09/15/2009    mild -no hearing aids  . HYPERLIPIDEMIA 04/25/2010  . OSTEOPENIA 04/20/2007  . Palpitations 03/13/2008  . THYROID NODULE, RIGHT 05/11/2008    Past Surgical History:  Procedure Laterality Date  . DILATION AND CURETTAGE, DIAGNOSTIC / THERAPEUTIC    . HAMMER TOE SURGERY     RIGHT  . HERNIA REPAIR  1992  . KNEE SURGERY  1991   Left  . Left Knee cyst  1985  . Lymph Node Removal      Social History   Socioeconomic History  . Marital status: Married    Spouse name: Not on file  . Number of children: Not on file  . Years of education: Not on file  . Highest education level: Not on file  Occupational History  . 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 Financial resource strain: Not on file  . Food insecurity:    Worry: Not on file    Inability: Not on file  . Transportation needs:    Medical: Not on file    Non-medical: Not on file  Tobacco Use  . Smoking status: Never Smoker  . Smokeless tobacco: Never Used  Substance and Sexual Activity  . Alcohol use: Yes    Alcohol/week: 8.4 oz    Types: 14 Glasses of wine per week  . Drug use: No  . Sexual activity: Yes    Birth control/protection: Post-menopausal  Lifestyle  . Physical activity:    Days per week: Not on  file    Minutes per session: Not on file  . Stress: Not on file  Relationships  . Social connections:    Talks on phone: Not on file    Gets together: Not on file    Attends religious service: Not on file    Active member of club or organization: Not on file    Attends meetings of clubs or organizations: Not on file    Relationship status: Not on file  . Intimate partner violence:    Fear of current or ex partner: Not on file    Emotionally abused: Not on file    Physically abused: Not on file    Forced sexual activity: Not on file  Other Topics Concern  . Not on file  Social History Narrative  . Not on file    Current Outpatient Medications on File Prior to Visit  Medication Sig Dispense Refill  . atorvastatin (LIPITOR) 10 MG tablet Take 1 tablet (10 mg total) by mouth daily. 365 tablet 0  . Cholecalciferol (VITAMIN D) 2000 units tablet Take 2,000 Units by mouth daily.    Marland Kitchen gabapentin (NEURONTIN) 100 MG capsule Take 2  capsules (200 mg total) by mouth at bedtime. 60 capsule 3  . hydrOXYzine (ATARAX/VISTARIL) 25 MG tablet Take 1 tablet (25 mg total) by mouth every 8 (eight) hours as needed. 90 tablet 3  . IRON, FERROUS GLUCONATE, PO Take 65 mg by mouth daily.    Marland Kitchen triamcinolone cream (KENALOG) 0.1 % Apply 1 application topically 4 (four) times daily. As needed for rash or itching 30 g 2  . Turmeric 500 MG TABS Take 1 tablet by mouth daily.     No current facility-administered medications on file prior to visit.     Allergies  Allergen Reactions  . Hydrocodone Other (See Comments)    drowsiness    Family History  Problem Relation Age of Onset  . Cancer Mother        Breast Cancer  . Breast cancer Mother   . Cancer Father        "Stomach" Cancer  . Stomach cancer Father   . Cancer Sister        Colon Cancer  . Breast cancer Sister   . Kidney disease Brother        Kidney Transplant  . Colon cancer Neg Hx   . Esophageal cancer Neg Hx   . Rectal cancer Neg Hx      BP 138/68   Pulse 82   Wt 166 lb 9.6 oz (75.6 kg)   SpO2 97%   BMI 27.72 kg/m   Review of Systems No cough.     Objective:   Physical Exam VITAL SIGNS:  See vs page GENERAL: no distress Chest wall: tender at the left post aspect. LUNGS:  Clear to auscultation.       Assessment & Plan:  Chest wall contusion, with persistent pain.    Patient Instructions  I have sent a prescription to your pharmacy, for s stronger pain pill. I hope you feel better soon.

## 2017-08-17 NOTE — Patient Instructions (Signed)
I have sent a prescription to your pharmacy, for s stronger pain pill. I hope you feel better soon.

## 2017-12-24 ENCOUNTER — Encounter: Payer: Self-pay | Admitting: Endocrinology

## 2017-12-24 ENCOUNTER — Ambulatory Visit (INDEPENDENT_AMBULATORY_CARE_PROVIDER_SITE_OTHER): Payer: BC Managed Care – PPO | Admitting: Endocrinology

## 2017-12-24 VITALS — BP 106/72 | HR 82 | Ht 65.0 in | Wt 168.2 lb

## 2017-12-24 DIAGNOSIS — R739 Hyperglycemia, unspecified: Secondary | ICD-10-CM | POA: Diagnosis not present

## 2017-12-24 DIAGNOSIS — Z0000 Encounter for general adult medical examination without abnormal findings: Secondary | ICD-10-CM | POA: Diagnosis not present

## 2017-12-24 NOTE — Progress Notes (Signed)
Subjective:    Patient ID: Brenda [name], female    DOB: [birth year], 67 y.o.   MRN: [id number]  HPI Pt is here for regular wellness examination, and is feeling pretty well in general, and says chronic med probs are stable, except as noted below Past Medical History:  Diagnosis Date  . ABDOMINAL/PELVIC SWELLING MASS/LUMP UNSPEC SITE 09/18/2007  . ACUTE URIS OF UNSPECIFIED SITE 06/06/2007  . Allergy   . Anemia   . Anxiety    no meds  . ASTHMATIC BRONCHITIS, ACUTE 05/11/2008  . DM 09/18/2007   borderline -diet controlled- no meds  . GERD (gastroesophageal reflux disease)   . Glucose intolerance (impaired glucose tolerance)   . HEARING LOSS 09/15/2009    mild -no hearing aids  . HYPERLIPIDEMIA 04/25/2010  . OSTEOPENIA 04/20/2007  . Palpitations 03/13/2008  . THYROID NODULE, RIGHT 05/11/2008    Past Surgical History:  Procedure Laterality Date  . DILATION AND CURETTAGE, DIAGNOSTIC / THERAPEUTIC    . HAMMER TOE SURGERY     RIGHT  . HERNIA REPAIR  1992  . KNEE SURGERY  1991   Left  . Left Knee cyst  1985  . Lymph Node Removal      Social History   Socioeconomic History  . Marital status: Married    Spouse name: Not on file  . Number of children: Not on file  . Years of education: Not on file  . Highest education level: Not on file  Occupational History  . 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 Financial resource strain: Not on file  . Food insecurity:    Worry: Not on file    Inability: Not on file  . Transportation needs:    Medical: Not on file    Non-medical: Not on file  Tobacco Use  . Smoking status: Never Smoker  . Smokeless tobacco: Never Used  Substance and Sexual Activity  . Alcohol use: Yes    Alcohol/week: 14.0 standard drinks    Types: 14 Glasses of wine per week  . Drug use: No  . Sexual activity: Yes    Birth control/protection: Post-menopausal  Lifestyle  . Physical activity:    Days per week: Not on file      Minutes per session: Not on file  . Stress: Not on file  Relationships  . Social connections:    Talks on phone: Not on file    Gets together: Not on file    Attends religious service: Not on file    Active member of club or organization: Not on file    Attends meetings of clubs or organizations: Not on file    Relationship status: Not on file  . Intimate partner violence:    Fear of current or ex partner: Not on file    Emotionally abused: Not on file    Physically abused: Not on file    Forced sexual activity: Not on file  Other Topics Concern  . Not on file  Social History Narrative  . Not on file    Current Outpatient Medications on File Prior to Visit  Medication Sig Dispense Refill  . atorvastatin (LIPITOR) 10 MG tablet Take 1 tablet (10 mg total) by mouth daily. 365 tablet 0  . Cholecalciferol (VITAMIN D) 2000 units tablet Take 2,000 Units by mouth daily.    Marland Kitchen gabapentin (NEURONTIN) 100 MG capsule Take 2 capsules (200 mg total) by mouth at bedtime. [place]  capsule 3  . hydrOXYzine (ATARAX/VISTARIL) 25 MG tablet Take 1 tablet (25 mg total) by mouth every 8 (eight) hours as needed. 90 tablet 3  . IRON, FERROUS GLUCONATE, PO Take 65 mg by mouth daily.    Marland Kitchen triamcinolone cream (KENALOG) 0.1 % Apply 1 application topically 4 (four) times daily. As needed for rash or itching 30 g 2  . Turmeric 500 MG TABS Take 1 tablet by mouth daily.     No current facility-administered medications on file prior to visit.     Allergies  Allergen Reactions  . Hydrocodone Other (See Comments)    drowsiness    Family History  Problem Relation Age of Onset  . Cancer Mother        Breast Cancer  . Breast cancer Mother   . Cancer Father        "Stomach" Cancer  . Stomach cancer Father   . Cancer Sister        Colon Cancer  . Breast cancer Sister   . Kidney disease Brother        Kidney Transplant  . Colon cancer Neg Hx   . Esophageal cancer Neg Hx   . Rectal cancer Neg Hx     BP  106/72   Pulse 82   Ht 5\' 5"  (1.651 m)   Wt 168 lb 3.2 oz (76.3 kg)   SpO2 96%   BMI 27.99 kg/m     Review of Systems Denies fever, fatigue, visual loss, hearing loss, chest pain, sob, cold intolerance, BRBPR, hematuria, syncope, and numbness. No change in chronic back pain, easy bruising, eczema, or chronic rhinorrhea.  Depression is well-controlled.      Objective:   Physical Exam VS: see vs page GEN: no distress HEAD: head: no deformity eyes: no periorbital swelling, no proptosis external nose and ears are normal mouth: no lesion seen NECK: supple, thyroid is not enlarged CHEST WALL: no deformity LUNGS: clear to auscultation CV: reg rate and rhythm, no murmur ABD: abdomen is soft, nontender.  no hepatosplenomegaly.  not distended.  no hernia MUSCULOSKELETAL: muscle bulk and strength are grossly normal.  no obvious joint swelling.  gait is normal and steady EXTEMITIES: no deformity.  no ulcer on the feet.  feet are of normal color and temp.  no edema PULSES: dorsalis pedis intact bilat.  no carotid bruit NEURO:  cn 2-12 grossly intact.   readily moves all 4's.  sensation is intact to touch on the feet SKIN:  Normal texture and temperature.  No rash or suspicious lesion is visible.   NODES:  None palpable at the neck PSYCH: alert, well-oriented.  Does not appear anxious nor depressed.  I personally reviewed electrocardiogram tracing (today): Indication: wellness Impression: NSR.  No MI.  No hypertrophy. Compared to 2018: no significant change      Assessment & Plan:  Wellness visit today, with problems stable, except as noted.   Patient Instructions  blood tests are requested for you today.  We'll let you know about the results. Please consider these measures for your health:  minimize alcohol.  Do not use tobacco products.  Have a colonoscopy at least every 10 years from age 71.  Women should have an annual mammogram from age 58.  Keep firearms safely stored.  Always  use seat belts.  have working smoke alarms in your home.  See an eye doctor and dentist regularly.  Never drive under the influence of alcohol or drugs (including prescription drugs).

## 2017-12-24 NOTE — Patient Instructions (Signed)
blood tests are requested for you today.  We'll let you know about the results. Please consider these measures for your health:  minimize alcohol.  Do not use tobacco products.  Have a colonoscopy at least every 10 years from age 65.  Women should have an annual mammogram from age 38.  Keep firearms safely stored.  Always use seat belts.  have working smoke alarms in your home.  See an eye doctor and dentist regularly.  Never drive under the influence of alcohol or drugs (including prescription drugs).

## 2017-12-26 ENCOUNTER — Other Ambulatory Visit (INDEPENDENT_AMBULATORY_CARE_PROVIDER_SITE_OTHER): Payer: BC Managed Care – PPO

## 2017-12-26 DIAGNOSIS — Z0000 Encounter for general adult medical examination without abnormal findings: Secondary | ICD-10-CM

## 2017-12-26 DIAGNOSIS — R739 Hyperglycemia, unspecified: Secondary | ICD-10-CM

## 2017-12-26 LAB — HEPATIC FUNCTION PANEL
ALBUMIN: 4.4 g/dL (ref 3.5–5.2)
ALT: 23 U/L (ref 0–35)
AST: 21 U/L (ref 0–37)
Alkaline Phosphatase: 65 U/L (ref 39–117)
Bilirubin, Direct: 0.1 mg/dL (ref 0.0–0.3)
TOTAL PROTEIN: 7.3 g/dL (ref 6.0–8.3)
Total Bilirubin: 0.4 mg/dL (ref 0.2–1.2)

## 2017-12-26 LAB — CBC WITH DIFFERENTIAL/PLATELET
BASOS ABS: 0.1 10*3/uL (ref 0.0–0.1)
Basophils Relative: 1.4 % (ref 0.0–3.0)
EOS ABS: 0.1 10*3/uL (ref 0.0–0.7)
EOS PCT: 2.1 % (ref 0.0–5.0)
HCT: 41.3 % (ref 36.0–46.0)
HEMOGLOBIN: 13 g/dL (ref 12.0–15.0)
Lymphocytes Relative: 37 % (ref 12.0–46.0)
Lymphs Abs: 2.4 10*3/uL (ref 0.7–4.0)
MCHC: 31.5 g/dL (ref 30.0–36.0)
MCV: 73.2 fl — ABNORMAL LOW (ref 78.0–100.0)
Monocytes Absolute: 0.7 10*3/uL (ref 0.1–1.0)
Monocytes Relative: 10.4 % (ref 3.0–12.0)
NEUTROS PCT: 49.1 % (ref 43.0–77.0)
Neutro Abs: 3.1 10*3/uL (ref 1.4–7.7)
Platelets: 208 10*3/uL (ref 150.0–400.0)
RBC: 5.64 Mil/uL — AB (ref 3.87–5.11)
RDW: 15 % (ref 11.5–15.5)
WBC: 6.4 10*3/uL (ref 4.0–10.5)

## 2017-12-26 LAB — URINALYSIS, ROUTINE W REFLEX MICROSCOPIC
Bilirubin Urine: NEGATIVE
Hgb urine dipstick: NEGATIVE
Ketones, ur: NEGATIVE
Leukocytes, UA: NEGATIVE
Nitrite: NEGATIVE
RBC / HPF: NONE SEEN (ref 0–?)
Specific Gravity, Urine: 1.025 (ref 1.000–1.030)
Total Protein, Urine: NEGATIVE
Urine Glucose: NEGATIVE
Urobilinogen, UA: 0.2 (ref 0.0–1.0)
pH: 5.5 (ref 5.0–8.0)

## 2017-12-26 LAB — LIPID PANEL
CHOL/HDL RATIO: 3
Cholesterol: 156 mg/dL (ref 0–200)
HDL: 51.2 mg/dL (ref >39.00)
LDL Cholesterol: 80 mg/dL (ref 0–99)
NONHDL: 104.84
TRIGLYCERIDES: 126 mg/dL (ref 0.0–149.0)
VLDL: 25.2 mg/dL (ref 0.0–40.0)

## 2017-12-26 LAB — BASIC METABOLIC PANEL
BUN: 22 mg/dL (ref 6–23)
CALCIUM: 9.2 mg/dL (ref 8.4–10.5)
CHLORIDE: 105 meq/L (ref 96–112)
CO2: 30 meq/L (ref 19–32)
CREATININE: 0.64 mg/dL (ref 0.40–1.20)
GFR: 119.79 mL/min (ref >60.00)
Glucose, Bld: 103 mg/dL — ABNORMAL HIGH (ref 70–99)
Potassium: 3.8 mEq/L (ref 3.5–5.1)
Sodium: 139 mEq/L (ref 135–145)

## 2017-12-26 LAB — HEMOGLOBIN A1C: Hgb A1c MFr Bld: 6.3 % (ref 4.6–6.5)

## 2017-12-26 LAB — TSH: TSH: 1.41 u[IU]/mL (ref 0.35–4.50)

## 2018-01-17 ENCOUNTER — Ambulatory Visit: Payer: BC Managed Care – PPO | Admitting: Nurse Practitioner

## 2018-01-17 ENCOUNTER — Encounter: Payer: Self-pay | Admitting: Nurse Practitioner

## 2018-01-17 VITALS — BP 140/90 | HR 76 | Ht 65.0 in | Wt 168.0 lb

## 2018-01-17 DIAGNOSIS — E785 Hyperlipidemia, unspecified: Secondary | ICD-10-CM | POA: Diagnosis not present

## 2018-01-17 DIAGNOSIS — G8929 Other chronic pain: Secondary | ICD-10-CM

## 2018-01-17 DIAGNOSIS — Z23 Encounter for immunization: Secondary | ICD-10-CM

## 2018-01-17 DIAGNOSIS — M545 Low back pain, unspecified: Secondary | ICD-10-CM

## 2018-01-17 DIAGNOSIS — R7303 Prediabetes: Secondary | ICD-10-CM | POA: Insufficient documentation

## 2018-01-17 MED ORDER — ATORVASTATIN CALCIUM 10 MG PO TABS: 10.0000 mg | ORAL_TABLET | Freq: Every day | ORAL | 3 refills | Status: DC

## 2018-01-17 NOTE — Assessment & Plan Note (Signed)
Stable Continue neurontin F/u for new, worsening symptoms 

## 2018-01-17 NOTE — Assessment & Plan Note (Addendum)
Stable since 2015 Discussed the role of healthy diet  in the management of DM and provided additional information on AVS RTC in 6 months for F/U- repeat A1C

## 2018-01-17 NOTE — Addendum Note (Signed)
Addended by: Cresenciano Lick on: 01/17/2018 03:07 PM   Modules accepted: Orders

## 2018-01-17 NOTE — Progress Notes (Signed)
Brenda [name] is a 67 y.o. female with the following history as recorded in [employer]:  Patient Active Problem List   Diagnosis Date Noted  . Hyperglycemia 12/24/2017  . Cough 01/22/2017  . Arthralgia 11/17/2015  . Low back pain 08/25/2015  . Menopausal state 08/22/2011  . Routine general medical examination at a health care facility 09/28/2010  . HYPERLIPIDEMIA 04/25/2010  . HEARING LOSS 09/15/2009  . Disorder of bone and cartilage 04/20/2007    Current Outpatient Medications  Medication Sig Dispense Refill  . atorvastatin (LIPITOR) 10 MG tablet Take 1 tablet (10 mg total) by mouth daily. 90 tablet 3  . Cholecalciferol (VITAMIN D) 2000 units tablet Take 2,000 Units by mouth daily.    Marland Kitchen gabapentin (NEURONTIN) 100 MG capsule Take 2 capsules (200 mg total) by mouth at bedtime. 60 capsule 3  . hydrocortisone 2.5 % cream APPLY A THIN LAYER TO FACE TWICE DAILY AS NEEDED FOR 1 WEEK ON 1 WEEK OFF AS NEEDED FOR FLARES  2  . hydrOXYzine (ATARAX/VISTARIL) 25 MG tablet Take 1 tablet (25 mg total) by mouth every 8 (eight) hours as needed. 90 tablet 3  . IRON, FERROUS GLUCONATE, PO Take 65 mg by mouth daily.    . metroNIDAZOLE (METROCREAM) 0.75 % cream APPLY ON THE SKIN TWICE DAILY  2  . triamcinolone cream (KENALOG) 0.1 % Apply 1 application topically 4 (four) times daily. As needed for rash or itching 30 g 2  . Turmeric 500 MG TABS Take 1 tablet by mouth daily.     No current facility-administered medications for this visit.     Allergies: Hydrocodone  Past Medical History:  Diagnosis Date  . ABDOMINAL/PELVIC SWELLING MASS/LUMP UNSPEC SITE 09/18/2007  . ACUTE URIS OF UNSPECIFIED SITE 06/06/2007  . Allergy   . Anemia   . Anxiety    no meds  . ASTHMATIC BRONCHITIS, ACUTE 05/11/2008  . DM 09/18/2007   borderline -diet controlled- no meds  . GERD (gastroesophageal reflux disease)   . Glucose intolerance (impaired glucose tolerance)   . HEARING LOSS 09/15/2009    mild -no hearing aids  .  HYPERLIPIDEMIA 04/25/2010  . OSTEOPENIA 04/20/2007  . Palpitations 03/13/2008  . THYROID NODULE, RIGHT 05/11/2008    Past Surgical History:  Procedure Laterality Date  . DILATION AND CURETTAGE, DIAGNOSTIC / THERAPEUTIC    . HAMMER TOE SURGERY     RIGHT  . HERNIA REPAIR  1992  . KNEE SURGERY  1991   Left  . Left Knee cyst  1985  . Lymph Node Removal      Family History  Problem Relation Age of Onset  . Cancer Mother        Breast Cancer  . Breast cancer Mother   . Cancer Father        "Stomach" Cancer  . Stomach cancer Father   . Cancer Sister        Colon Cancer  . Breast cancer Sister   . Kidney disease Brother        Kidney Transplant  . Colon cancer Neg Hx   . Esophageal cancer Neg Hx   . Rectal cancer Neg Hx     Social History   Tobacco Use  . Smoking status: Never Smoker  . Smokeless tobacco: Never Used  Substance Use Topics  . Alcohol use: Yes    Alcohol/week: 14.0 standard drinks    Types: 14 Glasses of wine per week     Subjective:  Ms Porcelli is here  today to establish care as a transfer from her endocrinologist, who was providing her primary care until now. Aside from primary care, she is routinely followed by podiatry for foot pain, dentist for routine dental care, gynecology for routine womens care, dermatology for annual skin check, dr Constance Holster for ear pain. She does not plan to return to endocrinology, as she is not diabetic, but is pre-diabetic. We will review HLD, back pain today, conditions which she takes daily medications for.  HLD- maintained on atorvastatin 10 daily Reports daily medication compliance without adverse medication effects including myalgias.  Lipid Panel     Component Value Date/Time   CHOL 156 12/26/2017 1125   TRIG 126.0 12/26/2017 1125   HDL 51.20 12/26/2017 1125   CHOLHDL 3 12/26/2017 1125   VLDL 25.2 12/26/2017 1125   LDLCALC 80 12/26/2017 1125   LDLDIRECT 71.0 12/21/2015 0950   Lower Back pain- Chronic Hx "bulging  disks" Takes neurontin prn at bedtime on days when pain is worse which does help her pain Has not noted any adverse effects to neurontin  No LMP recorded. Patient is postmenopausal.  ROS- See HPI  Objective:  Vitals:   01/17/18 1324  BP: 140/90  Pulse: 76  SpO2: 98%  Weight: 168 lb (76.2 kg)  Height: 5\' 5"  (1.651 m)    General: Well developed, well nourished, in no acute distress  Skin : Warm and dry.  Head: Normocephalic and atraumatic  Eyes: Sclera and conjunctiva clear; pupils round and reactive to light; extraocular movements intact  Oropharynx: Pink, supple. Neck: Supple Lungs: Respirations unlabored; clear to auscultation bilaterally without wheeze, rales, rhonchi  CVS exam: normal rate, regular rhythm, normal S1, S2, no murmur. Musculoskeletal: No deformities; no active joint inflammation  Extremities: No edema, cyanosis Vessels: Symmetric bilaterally  Neurologic: Alert and oriented; speech intact; face symmetrical; moves all extremities well; CNII-XII intact without focal deficit    Assessment:  1. Hyperlipidemia, unspecified hyperlipidemia type   2. Chronic bilateral low back pain without sciatica   3. Pre-diabetes     Plan:  12/26/17-CBC, hepatic function, TSH, CMET, lipid panel, urine and A1c reviewed, normal aside from pre-diabetes  Return in about 6 months (around 07/19/2018) for routine F/U of chronic conditions: prediabetes,A1c.  No orders of the defined types were placed in this encounter.   Requested Prescriptions   Signed Prescriptions Disp Refills  . atorvastatin (LIPITOR) 10 MG tablet 90 tablet 3    Sig: Take 1 tablet (10 mg total) by mouth daily.    Reviewed HM: Mammogram, PAP overdue, she says she has upcoming appointment with GYN Dr Matthew Saras to update Discussed DEXA scan, she says she will check with [facility] to see if it is offered there first, she will follow up if she would like to have DEXA here

## 2018-01-17 NOTE — Assessment & Plan Note (Signed)
Stable Continue atorvastatin Labs are up to date - atorvastatin (LIPITOR) 10 MG tablet; Take 1 tablet (10 mg total) by mouth daily.  Dispense: 90 tablet; Refill: 3

## 2018-01-17 NOTE — Patient Instructions (Signed)
Diabetes Mellitus and Nutrition When you have diabetes (diabetes mellitus), it is very important to have healthy eating habits because your blood sugar (glucose) levels are greatly affected by what you eat and drink. Eating healthy foods in the appropriate amounts, at about the same times every day, can help you:  Control your blood glucose.  Lower your risk of heart disease.  Improve your blood pressure.  Reach or maintain a healthy weight.  Every person with diabetes is different, and each person has different needs for a meal plan. Your health care provider may recommend that you work with a diet and nutrition specialist (dietitian) to make a meal plan that is best for you. Your meal plan may vary depending on factors such as:  The calories you need.  The medicines you take.  Your weight.  Your blood glucose, blood pressure, and cholesterol levels.  Your activity level.  Other health conditions you have, such as heart or kidney disease.  How do carbohydrates affect me? Carbohydrates affect your blood glucose level more than any other type of food. Eating carbohydrates naturally increases the amount of glucose in your blood. Carbohydrate counting is a method for keeping track of how many carbohydrates you eat. Counting carbohydrates is important to keep your blood glucose at a healthy level, especially if you use insulin or take certain oral diabetes medicines. It is important to know how many carbohydrates you can safely have in each meal. This is different for every person. Your dietitian can help you calculate how many carbohydrates you should have at each meal and for snack. Foods that contain carbohydrates include:  Bread, cereal, rice, pasta, and crackers.  Potatoes and corn.  Peas, beans, and lentils.  Milk and yogurt.  Fruit and juice.  Desserts, such as cakes, cookies, ice cream, and candy.  How does alcohol affect me? Alcohol can cause a sudden decrease in blood  glucose (hypoglycemia), especially if you use insulin or take certain oral diabetes medicines. Hypoglycemia can be a life-threatening condition. Symptoms of hypoglycemia (sleepiness, dizziness, and confusion) are similar to symptoms of having too much alcohol. If your health care provider says that alcohol is safe for you, follow these guidelines:  Limit alcohol intake to no more than 1 drink per day for nonpregnant women and 2 drinks per day for men. One drink equals 12 oz of beer, 5 oz of wine, or 1 oz of hard liquor.  Do not drink on an empty stomach.  Keep yourself hydrated with water, diet soda, or unsweetened iced tea.  Keep in mind that regular soda, juice, and other mixers may contain a lot of sugar and must be counted as carbohydrates.  What are tips for following this plan? Reading food labels  Start by checking the serving size on the label. The amount of calories, carbohydrates, fats, and other nutrients listed on the label are based on one serving of the food. Many foods contain more than one serving per package.  Check the total grams (g) of carbohydrates in one serving. You can calculate the number of servings of carbohydrates in one serving by dividing the total carbohydrates by 15. For example, if a food has 30 g of total carbohydrates, it would be equal to 2 servings of carbohydrates.  Check the number of grams (g) of saturated and trans fats in one serving. Choose foods that have low or no amount of these fats.  Check the number of milligrams (mg) of sodium in one serving. Most people   should limit total sodium intake to less than 2,300 mg per day.  Always check the nutrition information of foods labeled as "low-fat" or "nonfat". These foods may be higher in added sugar or refined carbohydrates and should be avoided.  Talk to your dietitian to identify your daily goals for nutrients listed on the label. Shopping  Avoid buying canned, premade, or processed foods. These  foods tend to be high in fat, sodium, and added sugar.  Shop around the outside edge of the grocery store. This includes fresh fruits and vegetables, bulk grains, fresh meats, and fresh dairy. Cooking  Use low-heat cooking methods, such as baking, instead of high-heat cooking methods like deep frying.  Cook using healthy oils, such as olive, canola, or sunflower oil.  Avoid cooking with butter, cream, or high-fat meats. Meal planning  Eat meals and snacks regularly, preferably at the same times every day. Avoid going long periods of time without eating.  Eat foods high in fiber, such as fresh fruits, vegetables, beans, and whole grains. Talk to your dietitian about how many servings of carbohydrates you can eat at each meal.  Eat 4-6 ounces of lean protein each day, such as lean meat, chicken, fish, eggs, or tofu. 1 ounce is equal to 1 ounce of meat, chicken, or fish, 1 egg, or 1/4 cup of tofu.  Eat some foods each day that contain healthy fats, such as avocado, nuts, seeds, and fish. Lifestyle   Check your blood glucose regularly.  Exercise at least 30 minutes 5 or more days each week, or as told by your health care provider.  Take medicines as told by your health care provider.  Do not use any products that contain nicotine or tobacco, such as cigarettes and e-cigarettes. If you need help quitting, ask your health care provider.  Work with a counselor or diabetes educator to identify strategies to manage stress and any emotional and social challenges. What are some questions to ask my health care provider?  Do I need to meet with a diabetes educator?  Do I need to meet with a dietitian?  What number can I call if I have questions?  When are the best times to check my blood glucose? Where to find more information:  American Diabetes Association: diabetes.org/food-and-fitness/food  Academy of Nutrition and Dietetics:  www.eatright.org/resources/health/diseases-and-conditions/diabetes  National Institute of Diabetes and Digestive and Kidney Diseases (NIH): www.niddk.nih.gov/health-information/diabetes/overview/diet-eating-physical-activity Summary  A healthy meal plan will help you control your blood glucose and maintain a healthy lifestyle.  Working with a diet and nutrition specialist (dietitian) can help you make a meal plan that is best for you.  Keep in mind that carbohydrates and alcohol have immediate effects on your blood glucose levels. It is important to count carbohydrates and to use alcohol carefully. This information is not intended to replace advice given to you by your health care provider. Make sure you discuss any questions you have with your health care provider. Document Released: 12/22/2004 Document Revised: 05/01/2016 Document Reviewed: 05/01/2016 Elsevier Interactive Patient Education  2018 Elsevier Inc.  

## 2018-03-19 NOTE — Progress Notes (Signed)
[name] [place] Bren[place] [name], Bren[name] [address] Phone: [phone] Subjective:   Brenda [name], am serving as a scribe for Dr. Hulan Saas.  I'm seeing this patient by the request  of:  Lance Sell, NP   CC: Bilateral knee pain  [id number]  Brenda [name] is a 67 y.o. female coming in with complaint of back pain since January 2019. She is having cramps in the left thigh along with tingling. Walking increases her pain. In the morning she cannot stand for long to make tea. She has spasms in the left lower back and glute. Has tired heat, stretching. Was using pennsaid.  Patient states that it does affect daily activities.  Does stand a lot at work.  Patient states symptoms make me significantly painful and has decreased range of motion at the end of the day secondary to possible swelling.     Past Medical History:  Diagnosis Date  . ABDOMINAL/PELVIC SWELLING MASS/LUMP UNSPEC SITE 09/18/2007  . ACUTE URIS OF UNSPECIFIED SITE 06/06/2007  . Allergy   . Anemia   . Anxiety    [name] meds  . ASTHMATIC BRONCHITIS, ACUTE 05/11/2008  . DM 09/18/2007   borderline -diet controlled- [name] meds  . GERD (gastroesophageal reflux disease)   . Glucose intolerance (impaired glucose tolerance)   . HEARING LOSS 09/15/2009    mild -[name] hearing aids  . HYPERLIPIDEMIA 04/25/2010  . OSTEOPENIA 04/20/2007  . Palpitations 03/13/2008  . THYROID NODULE, RIGHT 05/11/2008   Past Surgical History:  Procedure Laterality Date  . DILATION AND CURETTAGE, DIAGNOSTIC / THERAPEUTIC    . HAMMER TOE SURGERY     RIGHT  . HERNIA REPAIR  1992  . KNEE SURGERY  1991   Left  . Left Knee cyst  1985  . Lymph Node Removal     Social History   Socioeconomic History  . Marital status: Married    Spouse name: Not on file  . Number of children: Not on file  . Years of education: Not on file  . Highest education level: Not on file  Occupational History  . Occupation: Nurse, adult: [employer]  Social Needs  . Financial resource strain: Not on file  . Food insecurity:    Worry: Not on file    Inability: Not on file  . Transportation needs:    Medical: Not on file    Non-medical: Not on file  Tobacco Use  . Smoking status: Never Smoker  . Smokeless tobacco: Never Used  Substance and Sexual Activity  . Alcohol use: Yes    Alcohol/week: 14.0 standard drinks    Types: 14 Glasses of wine per week  . Drug use: [name]  . Sexual activity: Yes    Birth control/protection: Post-menopausal  Lifestyle  . Physical activity:    Days per week: Not on file    Minutes per session: Not on file  . Stress: Not on file  Relationships  . Social connections:    Talks on phone: Not on file    Gets together: Not on file    Attends religious service: Not on file    Active member of club or organization: Not on file    Attends meetings of clubs or organizations: Not on file    Relationship status: Not on file  Other Topics Concern  . Not on file  Social History Narrative  . Not on file   Allergies  Allergen Reactions  . Hydrocodone Other (See Comments)    drowsiness   Family History  Problem Relation Age of Onset  . Cancer Mother        Breast Cancer  . Breast cancer Mother   . Cancer Father        "Stomach" Cancer  . Stomach cancer Father   . Cancer Sister        Colon Cancer  . Breast cancer Sister   . Kidney disease Brother        Kidney Transplant  . Colon cancer Neg Hx   . Esophageal cancer Neg Hx   . Rectal cancer Neg Hx      Current Outpatient Medications (Cardiovascular):  .  atorvastatin (LIPITOR) 10 MG tablet, Take 1 tablet (10 mg total) by mouth daily.    Current Outpatient Medications (Hematological):  Marland Kitchen  IRON, FERROUS GLUCONATE, PO, Take 65 mg by mouth daily.  Current Outpatient Medications (Other):  Marland Kitchen  Cholecalciferol (VITAMIN D) 2000 units tablet, Take 2,000 Units by mouth daily. .  hydrocortisone 2.5 % cream, APPLY  A THIN LAYER TO FACE TWICE DAILY AS NEEDED FOR 1 WEEK ON 1 WEEK OFF AS NEEDED FOR FLARES .  hydrOXYzine (ATARAX/VISTARIL) 25 MG tablet, Take 1 tablet (25 mg total) by mouth every 8 (eight) hours as needed. .  metroNIDAZOLE (METROCREAM) 0.75 % cream, APPLY ON THE SKIN TWICE DAILY .  triamcinolone cream (KENALOG) 0.1 %, Apply 1 application topically 4 (four) times daily. As needed for rash or itching .  Turmeric 500 MG TABS, Take 1 tablet by mouth daily. Marland Kitchen  gabapentin (NEURONTIN) 100 MG capsule, Take 2 capsules (200 mg total) by mouth at bedtime. .  Vitamin D, Ergocalciferol, (DRISDOL) 1.25 MG (50000 UT) CAPS capsule, Take 1 capsule (50,000 Units total) by mouth every 7 (seven) days.    Past medical history, social, surgical and family history all reviewed in electronic medical record.  [name] pertanent information unless stated regarding to the chief complaint.   Review of Systems:  [name] headache, visual changes, nausea, vomiting, diarrhea, constipation, dizziness, abdominal pain, skin rash, fevers, chills, night sweats, weight loss, swollen lymph nodes, body aches, joint swelling, , chest pain, shortness of breath, mood changes.  Positive muscle aches  Objective  Blood pressure 108/78, pulse 82, height 5\' 5"  (1.651 m), weight 169 lb (76.7 kg), SpO2 97 %.   General: [name] apparent distress alert and oriented x3 mood and affect normal, dressed appropriately.  HEENT: Pupils equal, extraocular movements intact  Respiratory: Patient's speak in full sentences and does not appear short of breath  Cardiovascular: [name] lower extremity edema, non tender, [name] erythema  Skin: Warm dry intact with [name] signs of infection or rash on extremities or on axial skeleton.  Abdomen: Soft nontender obese Neuro: Cranial nerves II through XII are intact, neurovascularly intact in all extremities with 2+ DTRs and 2+ pulses.  Lymph: [name] lymphadenopathy of posterior or anterior cervical chain or axillae bilaterally.  Gait  antalgic MSK:  tender with full range of motion and good stability and symmetric strength and tone of shoulders, elbows, wrist, hip, and ankles bilaterally.  Knee: Bilateral valgus deformity noted. Large thigh to calf ratio.  Tender to palpation over medial and PF joint line.  ROM full in flexion and extension and lower leg rotation. instability with valgus force.  painful patellar compression. Patellar glide with moderate crepitus. Patellar and quadriceps tendons unremarkable. Hamstring and quadriceps strength is normal.   After informed written and  verbal consent, patient was seated on exam table. Right knee was prepped with alcohol swab and utilizing anterolateral approach, patient's right knee space was injected with 4:1  marcaine 0.5%: Kenalog 40mg /dL. Patient tolerated the procedure well without immediate complications.  After informed written and verbal consent, patient was seated on exam table. Left knee was prepped with alcohol swab and utilizing anterolateral approach, patient's left knee space was injected with 4:1  marcaine 0.5%: Kenalog 40mg /dL. Patient tolerated the procedure well without immediate complications.     Impression and Recommendations:     This case required medical decision making of moderate complexity. The above documentation has been reviewed and is accurate and complete Lyndal Pulley, DO       Note: This dictation was prepared with Dragon dictation along with smaller phrase technology. Any transcriptional errors that result from this process are unintentional.

## 2018-03-20 ENCOUNTER — Encounter: Payer: Self-pay | Admitting: Family Medicine

## 2018-03-20 ENCOUNTER — Ambulatory Visit: Payer: BC Managed Care – PPO | Admitting: Family Medicine

## 2018-03-20 DIAGNOSIS — M170 Bilateral primary osteoarthritis of knee: Secondary | ICD-10-CM | POA: Diagnosis not present

## 2018-03-20 MED ORDER — GABAPENTIN 100 MG PO CAPS: 200.0000 mg | ORAL_CAPSULE | Freq: Every day | ORAL | 3 refills | Status: DC

## 2018-03-20 MED ORDER — VITAMIN D (ERGOCALCIFEROL) 1.25 MG (50000 UNIT) PO CAPS: 50000.0000 [IU] | ORAL_CAPSULE | ORAL | 0 refills | Status: DC

## 2018-03-20 NOTE — Patient Instructions (Addendum)
Good to see you  Sounds like spinal stenosis  If you can get a disc of the MRI that would be helpful  Exercises 3 times a week.  Gabapentin 200mg  at night Once weekly vitamin D for 12 weeks Ice 20 minutes 2 times daily. Usually after activity and before bed. Get over the counter  Iron 65mg  with 500mg  of vitamin C daily  Good shoes with rigid bottom.  Jalene Mullet, [place] or New balance greater then 700 Stay active See me again in 3-6 weeks and lets see how we are doing  Happy holidays!

## 2018-03-20 NOTE — Assessment & Plan Note (Signed)
Bilateral injections given today.  Discussed icing regimen and home exercise, discussed which activities of doing which wants to avoid.  Patient is to follow-up in the next 4 to 8 weeks.  Patient could be candidate for Visco supplementation.  Discussed the importance of weight loss.  Patient is in agreement with the plan and will follow-up at that time

## 2018-03-22 ENCOUNTER — Other Ambulatory Visit: Payer: Self-pay | Admitting: Obstetrics and Gynecology

## 2018-03-22 DIAGNOSIS — Z803 Family history of malignant neoplasm of breast: Secondary | ICD-10-CM

## 2018-03-25 ENCOUNTER — Other Ambulatory Visit: Payer: Self-pay | Admitting: Obstetrics and Gynecology

## 2018-03-25 DIAGNOSIS — R928 Other abnormal and inconclusive findings on diagnostic imaging of breast: Secondary | ICD-10-CM

## 2018-03-28 ENCOUNTER — Other Ambulatory Visit: Payer: Self-pay | Admitting: Obstetrics and Gynecology

## 2018-03-28 ENCOUNTER — Ambulatory Visit
Admission: RE | Admit: 2018-03-28 | Discharge: 2018-03-28 | Disposition: A | Discharge status: Home / Self Care | Payer: BC Managed Care – PPO | Source: Ambulatory Visit | Attending: Obstetrics and Gynecology | Admitting: Obstetrics and Gynecology

## 2018-03-28 ENCOUNTER — Ambulatory Visit: Payer: BC Managed Care – PPO

## 2018-03-28 DIAGNOSIS — R928 Other abnormal and inconclusive findings on diagnostic imaging of breast: Secondary | ICD-10-CM

## 2018-04-08 ENCOUNTER — Other Ambulatory Visit: Payer: BC Managed Care – PPO

## 2018-04-13 ENCOUNTER — Ambulatory Visit
Admission: RE | Admit: 2018-04-13 | Discharge: 2018-04-13 | Disposition: A | Discharge status: Home / Self Care | Payer: BC Managed Care – PPO | Source: Ambulatory Visit | Attending: Obstetrics and Gynecology | Admitting: Obstetrics and Gynecology

## 2018-04-13 DIAGNOSIS — Z803 Family history of malignant neoplasm of breast: Secondary | ICD-10-CM

## 2018-04-13 MED ORDER — GADOBUTROL 1 MMOL/ML IV SOLN
8.0000 mL | Freq: Once | INTRAVENOUS | Status: AC | PRN
  Administered 2018-04-13: 8 mL via INTRAVENOUS

## 2018-04-17 NOTE — Progress Notes (Signed)
[name] [place], [place] [address] Phone: [phone] Subjective:    I Brenda [name] am serving as a Education administrator for Dr. Hulan Saas.  CC: Back pain  [id number]  PAILYN BELLEVUE is a 68 y.o. female coming in with complaint of back pain. States she feels good while on the medicine.  Back pain.  Patient has had this for quite some time.  Patient is been seen by other providers previously.  Brings in an MRI from August 2019.  Independently visualized by me.  Patient does have an L3 nerve root impingement on the left side.  Patient also has some mild bulging with some impingement on the L5 nerve root on the right side.  Patient complains more of the left side with left lumbar radiculopathy.  Patient does have pain in that leg daily as well as the low back.  Gabapentin does not seem to be helping.    Past Medical History:  Diagnosis Date  . ABDOMINAL/PELVIC SWELLING MASS/LUMP UNSPEC SITE 09/18/2007  . ACUTE URIS OF UNSPECIFIED SITE 06/06/2007  . Allergy   . Anemia   . Anxiety    no meds  . ASTHMATIC BRONCHITIS, ACUTE 05/11/2008  . DM 09/18/2007   borderline -diet controlled- no meds  . GERD (gastroesophageal reflux disease)   . Glucose intolerance (impaired glucose tolerance)   . HEARING LOSS 09/15/2009    mild -no hearing aids  . HYPERLIPIDEMIA 04/25/2010  . OSTEOPENIA 04/20/2007  . Palpitations 03/13/2008  . THYROID NODULE, RIGHT 05/11/2008   Past Surgical History:  Procedure Laterality Date  . DILATION AND CURETTAGE, DIAGNOSTIC / THERAPEUTIC    . HAMMER TOE SURGERY     RIGHT  . HERNIA REPAIR  1992  . KNEE SURGERY  1991   Left  . Left Knee cyst  1985  . Lymph Node Removal     Social History   Socioeconomic History  . Marital status: Married    Spouse name: Not on file  . Number of children: Not on file  . Years of education: Not on file  . Highest education level: Not on file  Occupational History  . Occupation: Surveyor, minerals:  [employer]  Social Needs  . Financial resource strain: Not on file  . Food insecurity:    Worry: Not on file    Inability: Not on file  . Transportation needs:    Medical: Not on file    Non-medical: Not on file  Tobacco Use  . Smoking status: Never Smoker  . Smokeless tobacco: Never Used  Substance and Sexual Activity  . Alcohol use: Yes    Alcohol/week: 14.0 standard drinks    Types: 14 Glasses of wine per week  . Drug use: No  . Sexual activity: Yes    Birth control/protection: Post-menopausal  Lifestyle  . Physical activity:    Days per week: Not on file    Minutes per session: Not on file  . Stress: Not on file  Relationships  . Social connections:    Talks on phone: Not on file    Gets together: Not on file    Attends religious service: Not on file    Active member of club or organization: Not on file    Attends meetings of clubs or organizations: Not on file    Relationship status: Not on file  Other Topics Concern  . Not on file  Social History Narrative  . Not on  file   Allergies  Allergen Reactions  . Hydrocodone Other (See Comments)    drowsiness   Family History  Problem Relation Age of Onset  . Cancer Mother        Breast Cancer  . Breast cancer Mother   . Cancer Father        "Stomach" Cancer  . Stomach cancer Father   . Cancer Sister        Colon Cancer  . Breast cancer Sister   . Kidney disease Brother        Kidney Transplant  . Colon cancer Neg Hx   . Esophageal cancer Neg Hx   . Rectal cancer Neg Hx      Current Outpatient Medications (Cardiovascular):  .  atorvastatin (LIPITOR) 10 MG tablet, Take 1 tablet (10 mg total) by mouth daily.    Current Outpatient Medications (Hematological):  Marland Kitchen  IRON, FERROUS GLUCONATE, PO, Take 65 mg by mouth daily.  Current Outpatient Medications (Other):  Marland Kitchen  Cholecalciferol (VITAMIN D) 2000 units tablet, Take 2,000 Units by mouth daily. Marland Kitchen  gabapentin (NEURONTIN) 100 MG  capsule, Take 2 capsules (200 mg total) by mouth at bedtime. .  hydrocortisone 2.5 % cream, APPLY A THIN LAYER TO FACE TWICE DAILY AS NEEDED FOR 1 WEEK ON 1 WEEK OFF AS NEEDED FOR FLARES .  hydrOXYzine (ATARAX/VISTARIL) 25 MG tablet, Take 1 tablet (25 mg total) by mouth every 8 (eight) hours as needed. .  metroNIDAZOLE (METROCREAM) 0.75 % cream, APPLY ON THE SKIN TWICE DAILY .  triamcinolone cream (KENALOG) 0.1 %, Apply 1 application topically 4 (four) times daily. As needed for rash or itching .  Turmeric 500 MG TABS, Take 1 tablet by mouth daily. .  Vitamin D, Ergocalciferol, (DRISDOL) 1.25 MG (50000 UT) CAPS capsule, Take 1 capsule (50,000 Units total) by mouth every 7 (seven) days.    Past medical history, social, surgical and family history all reviewed in electronic medical record.  No pertanent information unless stated regarding to the chief complaint.   Review of Systems:  No headache, visual changes, nausea, vomiting, diarrhea, constipation, dizziness, abdominal pain, skin rash, fevers, chills, night sweats, weight loss, swollen lymph nodes, body aches, joint swelling, s, chest pain, shortness of breath, mood changes.  Positive muscle aches  Objective  Blood pressure 122/78, pulse 88, height 5\' 5"  (1.651 m), weight 169 lb (76.7 kg), SpO2 97 %.    General: No apparent distress alert and oriented x3 mood and affect normal, dressed appropriately.  HEENT: Pupils equal, extraocular movements intact  Respiratory: Patient's speak in full sentences and does not appear short of breath  Cardiovascular: No lower extremity edema, non tender, no erythema  Skin: Warm dry intact with no signs of infection or rash on extremities or on axial skeleton.  Abdomen: Soft nontender  Neuro: Cranial nerves II through XII are intact, neurovascularly intact in all extremities with 2+ DTRs and 2+ pulses.  Lymph: No lymphadenopathy of posterior or anterior cervical chain or axillae bilaterally.  Gait normal  with good balance and coordination.  MSK:  Non tender with full range of motion and good stability and symmetric strength and tone of shoulders, elbows, wrist, hip, and ankles bilaterally.  Knee: Bilateral valgus deformity noted.  Abnormal thigh to calf ratio.  Tender to palpation over medial and PF joint line.  Less tender than previous exam ROM full in flexion and extension and lower leg rotation. instability with valgus force.  painful patellar compression. Patellar glide with  moderate crepitus. Patellar and quadriceps tendons unremarkable. Hamstring and quadriceps strength is normal. \  Back exam shows loss of lordosis.  Patient does have tenderness to palpation the paraspinal musculature of the left side of the lumbar spine.  Patient has some pain in the piriformis with a positive Faber test.  Positive straight leg test with radicular symptoms in the L3 distribution on the left side.  Very minimal weakness in 4-5 strength at hip flexion as well as somewhat of knee extension.  Deep tendon reflexes though appear to be intact    Impression and Recommendations:     This case required medical decision making of moderate complexity. The above documentation has been reviewed and is accurate and complete Lyndal Pulley, DO       Note: This dictation was prepared with Dragon dictation along with smaller phrase technology. Any transcriptional errors that result from this process are unintentional.

## 2018-04-18 ENCOUNTER — Ambulatory Visit: Payer: BC Managed Care – PPO | Admitting: Family Medicine

## 2018-04-18 ENCOUNTER — Encounter: Payer: Self-pay | Admitting: Family Medicine

## 2018-04-18 DIAGNOSIS — M170 Bilateral primary osteoarthritis of knee: Secondary | ICD-10-CM | POA: Diagnosis not present

## 2018-04-18 DIAGNOSIS — M5416 Radiculopathy, lumbar region: Secondary | ICD-10-CM | POA: Diagnosis not present

## 2018-04-18 NOTE — Assessment & Plan Note (Signed)
Left lumbar radiculopathy.  Discussed with patient at great length.  Discussed with her the possibility of an L3 nerve root impingement.  Patient wants to discuss with the [facility] and see if this is what they would agree on.  Patient will follow-up with me again

## 2018-04-18 NOTE — Assessment & Plan Note (Signed)
Stable after injections.  We will hold on any viscosupplementation at this time.  Discussed icing regimen.  Discussed topical anti-inflammatories.  Continue conservative therapy.  Can repeat injections in 2 to 3 months if necessary

## 2018-04-18 NOTE — Patient Instructions (Signed)
Good to see you  I do think that you have a L3 nerve root impingement on the left that would correspond to your symptoms  If you would like me to order the injection write me in your my chart  See me again in when you need me otherwise Happy New Year!

## 2018-05-10 ENCOUNTER — Telehealth: Payer: Self-pay | Admitting: Family Medicine

## 2018-05-10 NOTE — Telephone Encounter (Signed)
Copied from [place] [id number]. Topic: Quick Communication - See Telephone Encounter >> May 10, 2018  4:13 PM Loma Boston wrote: CRM for notification. See Telephone encounter for: 05/10/18. Pt says Dr Tamala Julian put in the notes on her visit of 04/18/18 that it was her knee and she says it was her back. She says that it is making her have a 2nd opinion and she wants it corrected. Call her at [phone] with questions.

## 2018-05-10 NOTE — Telephone Encounter (Signed)
Patient is requesting review for addendum by provider. Please forward to Dr Tamala Julian

## 2018-09-20 ENCOUNTER — Telehealth: Payer: Self-pay | Admitting: Family Medicine

## 2018-09-20 NOTE — Telephone Encounter (Signed)
eferred Times: Any  Reason: To address the following health maintenance concerns. Foot Exam  Comments: I Have severe pain in my knee and would like an appointment with Dr. Creig Hines  Patient is requesting to be seen soon through my chart.  Please follow up in regard.  Thanks

## 2018-09-23 ENCOUNTER — Ambulatory Visit: Payer: Self-pay

## 2018-09-23 ENCOUNTER — Encounter: Payer: Self-pay | Admitting: Family Medicine

## 2018-09-23 ENCOUNTER — Ambulatory Visit: Payer: BC Managed Care – PPO | Admitting: Family Medicine

## 2018-09-23 ENCOUNTER — Other Ambulatory Visit: Payer: Self-pay

## 2018-09-23 VITALS — BP 112/72 | HR 72 | Ht 65.0 in

## 2018-09-23 DIAGNOSIS — M25561 Pain in right knee: Secondary | ICD-10-CM | POA: Diagnosis not present

## 2018-09-23 DIAGNOSIS — M170 Bilateral primary osteoarthritis of knee: Secondary | ICD-10-CM

## 2018-09-23 DIAGNOSIS — S83001A Unspecified subluxation of right patella, initial encounter: Secondary | ICD-10-CM | POA: Diagnosis not present

## 2018-09-23 NOTE — Patient Instructions (Signed)
Good to see you  Ice 20 minutes 2 times daily. Usually after activity and before bed. Exercises 3 times a week.  pennsaid pinkie amount topically 2 times daily as needed.  Vitamin D 2000 IU daily  See me again in 4 weeks.   

## 2018-09-23 NOTE — Progress Notes (Signed)
[name] [address], [place] [address] Phone: [phone] Subjective:    I'm seeing this patient by the request  of:    CC: Knee pain  [id number]     Update 09/23/2018: Brenda [name] is a 68 y.o. female coming in with complaint of right knee pain. States that she is having medial knee pain. States that she has been walking 2-3 miles in her neighborhood. States that over 1 week her pain has been increasing. Has not been able to sleep. Using IBU, bracing and ice. Has not walked in 10 days and the pain has subsided.  Patient has had known degenerative knee patient has been treated by another provider previously this is a new patient reported to [place].     Past Medical History:  Diagnosis Date  . ABDOMINAL/PELVIC SWELLING MASS/LUMP UNSPEC SITE 09/18/2007  . ACUTE URIS OF UNSPECIFIED SITE 06/06/2007  . Allergy   . Anemia   . Anxiety    no meds  . ASTHMATIC BRONCHITIS, ACUTE 05/11/2008  . DM 09/18/2007   borderline -diet controlled- no meds  . GERD (gastroesophageal reflux disease)   . Glucose intolerance (impaired glucose tolerance)   . HEARING LOSS 09/15/2009    mild -no hearing aids  . HYPERLIPIDEMIA 04/25/2010  . OSTEOPENIA 04/20/2007  . Palpitations 03/13/2008  . THYROID NODULE, RIGHT 05/11/2008   Past Surgical History:  Procedure Laterality Date  . DILATION AND CURETTAGE, DIAGNOSTIC / THERAPEUTIC    . HAMMER TOE SURGERY     RIGHT  . HERNIA REPAIR  1992  . KNEE SURGERY  1991   Left  . Left Knee cyst  1985  . Lymph Node Removal     Social History   Socioeconomic History  . Marital status: Married    Spouse name: Not on file  . Number of children: Not on file  . Years of education: Not on file  . Highest education level: Not on file  Occupational History  . Occupation: Nurse, adult: [employer]  Social Needs  . Financial resource strain: Not on file  . Food insecurity    Worry: Not on file   Inability: Not on file  . Transportation needs    Medical: Not on file    Non-medical: Not on file  Tobacco Use  . Smoking status: Never Smoker  . Smokeless tobacco: Never Used  Substance and Sexual Activity  . Alcohol use: Yes    Alcohol/week: 14.0 standard drinks    Types: 14 Glasses of wine per week  . Drug use: No  . Sexual activity: Yes    Birth control/protection: Post-menopausal  Lifestyle  . Physical activity    Days per week: Not on file    Minutes per session: Not on file  . Stress: Not on file  Relationships  . Social Herbalist on phone: Not on file    Gets together: Not on file    Attends religious service: Not on file    Active member of club or organization: Not on file    Attends meetings of clubs or organizations: Not on file    Relationship status: Not on file  Other Topics Concern  . Not on file  Social History Narrative  . Not on file   Allergies  Allergen Reactions  . Hydrocodone Other (See Comments)    drowsiness   Family History  Problem Relation Age of Onset  .  Cancer Mother        Breast Cancer  . Breast cancer Mother   . Cancer Father        "Stomach" Cancer  . Stomach cancer Father   . Cancer Sister        Colon Cancer  . Breast cancer Sister   . Kidney disease Brother        Kidney Transplant  . Colon cancer Neg Hx   . Esophageal cancer Neg Hx   . Rectal cancer Neg Hx      Current Outpatient Medications (Cardiovascular):  .  atorvastatin (LIPITOR) 10 MG tablet, Take 1 tablet (10 mg total) by mouth daily.    Current Outpatient Medications (Hematological):  Marland Kitchen  IRON, FERROUS GLUCONATE, PO, Take 65 mg by mouth daily.  Current Outpatient Medications (Other):  Marland Kitchen  Cholecalciferol (VITAMIN D) 2000 units tablet, Take 2,000 Units by mouth daily. Marland Kitchen  gabapentin (NEURONTIN) 100 MG capsule, Take 2 capsules (200 mg total) by mouth at bedtime. .  hydrocortisone 2.5 % cream, APPLY A THIN LAYER TO FACE TWICE DAILY AS NEEDED FOR 1  WEEK ON 1 WEEK OFF AS NEEDED FOR FLARES .  hydrOXYzine (ATARAX/VISTARIL) 25 MG tablet, Take 1 tablet (25 mg total) by mouth every 8 (eight) hours as needed. .  metroNIDAZOLE (METROCREAM) 0.75 % cream, APPLY ON THE SKIN TWICE DAILY .  triamcinolone cream (KENALOG) 0.1 %, Apply 1 application topically 4 (four) times daily. As needed for rash or itching .  Turmeric 500 MG TABS, Take 1 tablet by mouth daily. .  Vitamin D, Ergocalciferol, (DRISDOL) 1.25 MG (50000 UT) CAPS capsule, Take 1 capsule (50,000 Units total) by mouth every 7 (seven) days.    Past medical history, social, surgical and family history all reviewed in electronic medical record.  No pertanent information unless stated regarding to the chief complaint.   Review of Systems:  No headache, visual changes, nausea, vomiting, diarrhea, constipation, dizziness, abdominal pain, skin rash, fevers, chills, night sweats, weight loss, swollen lymph nodes, body aches, joint swelling, muscle aches, chest pain, shortness of breath, mood changes.   Objective  Blood pressure 112/72, pulse 72, height 5\' 5"  (1.651 m), SpO2 98 %. Systems examined below as of    General: No apparent distress alert and oriented x3 mood and affect normal, dressed appropriately.  HEENT: Pupils equal, extraocular movements intact  Respiratory: Patient's speak in full sentences and does not appear short of breath  Cardiovascular: No lower extremity edema, non tender, no erythema  Skin: Warm dry intact with no signs of infection or rash on extremities or on axial skeleton.  Abdomen: Soft nontender  Neuro: Cranial nerves II through XII are intact, neurovascularly intact in all extremities with 2+ DTRs and 2+ pulses.  Lymph: No lymphadenopathy of posterior or anterior cervical chain or axillae bilaterally.  Gait antalgic.  MSK:  Non tender with full range of motion and good stability and symmetric strength and tone of shoulders, elbows, wrist, hip, and ankles  bilaterally.  Knee exam shows degenerative changes patient does have significant swelling and has an effusion of the right knee.  Patient does have some lateral translation of the knee.  With range of motion patient able to increase and was able to move the patella and knee Immediately.  Pain resolved somewhat immediately and increase range of motion.  Contralateral knee has some arthritic changes as well.  Musculoskeletal ultrasound was performed and interpreted by Lyndal Pulley  Limited ultrasound of patient's knee shows  the patient does have an effusion of the patellofemoral.  Meniscus appear to be intact.  MCL and LCL are unremarkable Impression: Mild to moderate arthritic changes with knee effusions mild to moderate    Impression and Recommendations:     This case required medical decision making of moderate complexity. The above documentation has been reviewed and is accurate and complete Lyndal Pulley, DO       Note: This dictation was prepared with Dragon dictation along with smaller phrase technology. Any transcriptional errors that result from this process are unintentional.

## 2018-09-23 NOTE — Telephone Encounter (Signed)
Pt scheduled today at 2pm

## 2018-09-23 NOTE — Assessment & Plan Note (Signed)
Patella subluxation.  Discussed with patient in great length.  Patient does have underlying arthritic changes.  Patient responded well known to the mild manipulation was given a Tru pull lite brace.  Discussed with patient about which activities of doing which was to avoid.  Increase activity slowly.  Follow-up again in 4 to 6 weeks

## 2018-10-22 ENCOUNTER — Ambulatory Visit (INDEPENDENT_AMBULATORY_CARE_PROVIDER_SITE_OTHER): Payer: BC Managed Care – PPO | Admitting: Family Medicine

## 2018-10-22 ENCOUNTER — Other Ambulatory Visit: Payer: Self-pay

## 2018-10-22 ENCOUNTER — Ambulatory Visit (INDEPENDENT_AMBULATORY_CARE_PROVIDER_SITE_OTHER)
Admission: RE | Admit: 2018-10-22 | Discharge: 2018-10-22 | Disposition: A | Discharge status: Home / Self Care | Payer: BC Managed Care – PPO | Source: Ambulatory Visit | Attending: Family Medicine | Admitting: Family Medicine

## 2018-10-22 VITALS — BP 100/70 | HR 86 | Ht 65.0 in | Wt 168.0 lb

## 2018-10-22 DIAGNOSIS — M25561 Pain in right knee: Secondary | ICD-10-CM

## 2018-10-22 DIAGNOSIS — G8929 Other chronic pain: Secondary | ICD-10-CM

## 2018-10-22 DIAGNOSIS — M170 Bilateral primary osteoarthritis of knee: Secondary | ICD-10-CM | POA: Diagnosis not present

## 2018-10-22 DIAGNOSIS — S83001A Unspecified subluxation of right patella, initial encounter: Secondary | ICD-10-CM | POA: Diagnosis not present

## 2018-10-22 NOTE — Patient Instructions (Signed)
Good ot se eyou  Xray downstairs INjected knee today  Go to pt  See me agai nin 4-6 weeks and if not better we will consider MRI

## 2018-10-22 NOTE — Assessment & Plan Note (Signed)
Injection given today.  Tolerated the procedure well.  Could be a candidate for Visco supplementation.  Start formal physical therapy.  Discussed which activities to do avoid.  Patient should increase activity as tolerated.  Follow-up again 6 weeks.

## 2018-10-22 NOTE — Progress Notes (Signed)
[name] [place], [place] [address] Phone: [phone] Subjective:   [name], am serving as a scribe for Dr. Hulan Saas.  I'm seeing this patient by the request  of:    CC:    [id number]   09/23/2018: Patella subluxation.  Discussed with patient in great length.  Patient does have underlying arthritic changes.  Patient responded well known to the mild manipulation was given a Tru pull lite brace.  Discussed with patient about which activities of doing which was to avoid.  Increase activity slowly.  Follow-up again in 4 to 6 weeks  Update 10/22/2018: Brenda [name] is a 68 y.o. female coming in with complaint of knee pain. Pain has been 10/10. Has heart palpitations from Pennsaid. Discontinued and is now using Voltaren. Went into the [facility] due to pain since we last saw her. Has not been using the brace. Has not been able to sleep. No new injury. Is starting physical therapy today.      Past Medical History:  Diagnosis Date  . ABDOMINAL/PELVIC SWELLING MASS/LUMP UNSPEC SITE 09/18/2007  . ACUTE URIS OF UNSPECIFIED SITE 06/06/2007  . Allergy   . Anemia   . Anxiety    no meds  . ASTHMATIC BRONCHITIS, ACUTE 05/11/2008  . DM 09/18/2007   borderline -diet controlled- no meds  . GERD (gastroesophageal reflux disease)   . Glucose intolerance (impaired glucose tolerance)   . HEARING LOSS 09/15/2009    mild -no hearing aids  . HYPERLIPIDEMIA 04/25/2010  . OSTEOPENIA 04/20/2007  . Palpitations 03/13/2008  . THYROID NODULE, RIGHT 05/11/2008   Past Surgical History:  Procedure Laterality Date  . DILATION AND CURETTAGE, DIAGNOSTIC / THERAPEUTIC    . HAMMER TOE SURGERY     RIGHT  . HERNIA REPAIR  1992  . KNEE SURGERY  1991   Left  . Left Knee cyst  1985  . Lymph Node Removal     Social History   Socioeconomic History  . Marital status: Married    Spouse name: Not on file  . Number of children: Not on file  . Years of  education: Not on file  . Highest education level: Not on file  Occupational History  . 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 Financial resource strain: Not on file  . Food insecurity    Worry: Not on file    Inability: Not on file  . Transportation needs    Medical: Not on file    Non-medical: Not on file  Tobacco Use  . Smoking status: Never Smoker  . Smokeless tobacco: Never Used  Substance and Sexual Activity  . Alcohol use: Yes    Alcohol/week: 14.0 standard drinks    Types: 14 Glasses of wine per week  . Drug use: No  . Sexual activity: Yes    Birth control/protection: Post-menopausal  Lifestyle  . Physical activity    Days per week: Not on file    Minutes per session: Not on file  . Stress: Not on file  Relationships  . Social Herbalist on phone: Not on file    Gets together: Not on file    Attends religious service: Not on file    Active member of club or organization: Not on file    Attends meetings of clubs or organizations: Not on file    Relationship status: Not on file  Other Topics Concern  . Not on file  Social History Narrative  . Not on file   Allergies  Allergen Reactions  . Hydrocodone Other (See Comments)    drowsiness   Family History  Problem Relation Age of Onset  . Cancer Mother        Breast Cancer  . Breast cancer Mother   . Cancer Father        "Stomach" Cancer  . Stomach cancer Father   . Cancer Sister        Colon Cancer  . Breast cancer Sister   . Kidney disease Brother        Kidney Transplant  . Colon cancer Neg Hx   . Esophageal cancer Neg Hx   . Rectal cancer Neg Hx      Current Outpatient Medications (Cardiovascular):  .  atorvastatin (LIPITOR) 10 MG tablet, Take 1 tablet (10 mg total) by mouth daily.    Current Outpatient Medications (Hematological):  Marland Kitchen  IRON, FERROUS GLUCONATE, PO, Take 65 mg by mouth daily.  Current Outpatient Medications (Other):  Marland Kitchen   Cholecalciferol (VITAMIN D) 2000 units tablet, Take 2,000 Units by mouth daily. Marland Kitchen  gabapentin (NEURONTIN) 100 MG capsule, Take 2 capsules (200 mg total) by mouth at bedtime. .  hydrocortisone 2.5 % cream, APPLY A THIN LAYER TO FACE TWICE DAILY AS NEEDED FOR 1 WEEK ON 1 WEEK OFF AS NEEDED FOR FLARES .  hydrOXYzine (ATARAX/VISTARIL) 25 MG tablet, Take 1 tablet (25 mg total) by mouth every 8 (eight) hours as needed. .  metroNIDAZOLE (METROCREAM) 0.75 % cream, APPLY ON THE SKIN TWICE DAILY .  triamcinolone cream (KENALOG) 0.1 %, Apply 1 application topically 4 (four) times daily. As needed for rash or itching .  Turmeric 500 MG TABS, Take 1 tablet by mouth daily. .  Vitamin D, Ergocalciferol, (DRISDOL) 1.25 MG (50000 UT) CAPS capsule, Take 1 capsule (50,000 Units total) by mouth every 7 (seven) days.    Past medical history, social, surgical and family history all reviewed in electronic medical record.  No pertanent information unless stated regarding to the chief complaint.   Review of Systems:  No headache, visual changes, nausea, vomiting, diarrhea, constipation, dizziness, abdominal pain, skin rash, fevers, chills, night sweats, weight loss, swollen lymph nodes, body aches, joint swelling, muscle aches, chest pain, shortness of breath, mood changes.   Objective  Blood pressure 100/70, pulse 86, height 5\' 5"  (1.651 m), weight 168 lb (76.2 kg), SpO2 95 %.    General: No apparent distress alert and oriented x3 mood and affect normal, dressed appropriately.  HEENT: Pupils equal, extraocular movements intact  Respiratory: Patient's speak in full sentences and does not appear short of breath  Cardiovascular: No lower extremity edema, non tender, no erythema  Skin: Warm dry intact with no signs of infection or rash on extremities or on axial skeleton.  Abdomen: Soft nontender  Neuro: Cranial nerves II through XII are intact, neurovascularly intact in all extremities with 2+ DTRs and 2+ pulses.   Lymph: No lymphadenopathy of posterior or anterior cervical chain or axillae bilaterally.  Gait normal with good balance and coordination.  MSK:  Non tender with full range of motion and good stability and symmetric strength and tone of shoulders, elbows, wrist, hip and ankles bilaterally.  Knee: Right valgus deformity noted.  Abnormal thigh to calf ratio.  Tender to palpation over  PF joint line.  ROM full in flexion and extension and lower leg rotation. painful patellar compression.  Patellar glide with moderate crepitus. Patellar and quadriceps tendons unremarkable. Hamstring and quadriceps strength is normal. Contralateral knee shows minimal arthritic changes and minimal pain  After informed written and verbal consent, patient was seated on exam table. Right knee was prepped with alcohol swab and utilizing anterolateral approach, patient's right knee space was injected with 4:1  marcaine 0.5%: Kenalog 40mg /dL. Patient tolerated the procedure well without immediate complications.   Impression and Recommendations:     This case required medical decision making of moderate complexity. The above documentation has been reviewed and is accurate and complete Lyndal Pulley, DO       Note: This dictation was prepared with Dragon dictation along with smaller phrase technology. Any transcriptional errors that result from this process are unintentional.

## 2018-12-03 ENCOUNTER — Ambulatory Visit: Payer: BC Managed Care – PPO | Admitting: Family Medicine

## 2018-12-26 ENCOUNTER — Telehealth: Payer: Self-pay | Admitting: Family

## 2018-12-26 NOTE — Telephone Encounter (Signed)
Copied from [place] [id number]. Topic: Appointment Scheduling - Transfer of Care >> Dec 26, 2018  3:56 PM Pauline Good wrote: Pt is requesting to transfer FROM: Ashleigh Pt is requesting to transfer TO: Jodi Mourning Reason for requested transfer: [facility] no longer there  Send CRM to patient's current PCP (transferring FROM).

## 2018-12-26 NOTE — Telephone Encounter (Signed)
Patient is requesting to transfer care to you due to [facility] no longer being here. Would you be willing to see her to establish care?

## 2018-12-27 NOTE — Telephone Encounter (Signed)
Yes, that is fine. 

## 2019-01-01 NOTE — Telephone Encounter (Signed)
Appointment scheduled.

## 2019-01-21 ENCOUNTER — Other Ambulatory Visit: Payer: Self-pay

## 2019-01-21 ENCOUNTER — Ambulatory Visit (INDEPENDENT_AMBULATORY_CARE_PROVIDER_SITE_OTHER): Payer: BC Managed Care – PPO | Admitting: Family

## 2019-01-21 ENCOUNTER — Other Ambulatory Visit: Payer: Self-pay | Admitting: Family

## 2019-01-21 ENCOUNTER — Other Ambulatory Visit (INDEPENDENT_AMBULATORY_CARE_PROVIDER_SITE_OTHER): Payer: BC Managed Care – PPO

## 2019-01-21 ENCOUNTER — Encounter: Payer: Self-pay | Admitting: Family

## 2019-01-21 VITALS — BP 106/78 | HR 92 | Temp 98.1°F | Ht 65.0 in | Wt 159.8 lb

## 2019-01-21 DIAGNOSIS — Z0000 Encounter for general adult medical examination without abnormal findings: Secondary | ICD-10-CM | POA: Diagnosis not present

## 2019-01-21 DIAGNOSIS — R7303 Prediabetes: Secondary | ICD-10-CM

## 2019-01-21 DIAGNOSIS — Z23 Encounter for immunization: Secondary | ICD-10-CM | POA: Diagnosis not present

## 2019-01-21 DIAGNOSIS — E559 Vitamin D deficiency, unspecified: Secondary | ICD-10-CM | POA: Diagnosis not present

## 2019-01-21 DIAGNOSIS — E785 Hyperlipidemia, unspecified: Secondary | ICD-10-CM

## 2019-01-21 DIAGNOSIS — K130 Diseases of lips: Secondary | ICD-10-CM

## 2019-01-21 LAB — VITAMIN D 25 HYDROXY (VIT D DEFICIENCY, FRACTURES): VITD: 14.27 ng/mL — ABNORMAL LOW (ref 30.00–100.00)

## 2019-01-21 LAB — COMPREHENSIVE METABOLIC PANEL
ALT: 18 U/L (ref 0–35)
AST: 16 U/L (ref 0–37)
Albumin: 4.2 g/dL (ref 3.5–5.2)
Alkaline Phosphatase: 58 U/L (ref 39–117)
BUN: 18 mg/dL (ref 6–23)
CO2: 28 mEq/L (ref 19–32)
Calcium: 9 mg/dL (ref 8.4–10.5)
Chloride: 105 mEq/L (ref 96–112)
Creatinine, Ser: 0.67 mg/dL (ref 0.40–1.20)
GFR: 106.55 mL/min (ref >60.00)
Glucose, Bld: 114 mg/dL — ABNORMAL HIGH (ref 70–99)
Potassium: 3.9 mEq/L (ref 3.5–5.1)
Sodium: 139 mEq/L (ref 135–145)
Total Bilirubin: 0.4 mg/dL (ref 0.2–1.2)
Total Protein: 6.9 g/dL (ref 6.0–8.3)

## 2019-01-21 LAB — LIPID PANEL
Cholesterol: 154 mg/dL (ref 0–200)
HDL: 54.1 mg/dL (ref >39.00)
LDL Cholesterol: 80 mg/dL (ref 0–99)
NonHDL: 100.17
Total CHOL/HDL Ratio: 3
Triglycerides: 100 mg/dL (ref 0.0–149.0)
VLDL: 20 mg/dL (ref 0.0–40.0)

## 2019-01-21 LAB — CBC WITH DIFFERENTIAL/PLATELET
Basophils Absolute: 0.1 10*3/uL (ref 0.0–0.1)
Basophils Relative: 1.6 % (ref 0.0–3.0)
Eosinophils Absolute: 0.1 10*3/uL (ref 0.0–0.7)
Eosinophils Relative: 2.2 % (ref 0.0–5.0)
HCT: 42.1 % (ref 36.0–46.0)
Hemoglobin: 13.2 g/dL (ref 12.0–15.0)
Lymphocytes Relative: 34.7 % (ref 12.0–46.0)
Lymphs Abs: 2.2 10*3/uL (ref 0.7–4.0)
MCHC: 31.4 g/dL (ref 30.0–36.0)
MCV: 74.2 fl — ABNORMAL LOW (ref 78.0–100.0)
Monocytes Absolute: 0.7 10*3/uL (ref 0.1–1.0)
Monocytes Relative: 11.1 % (ref 3.0–12.0)
Neutro Abs: 3.1 10*3/uL (ref 1.4–7.7)
Neutrophils Relative %: 50.4 % (ref 43.0–77.0)
Platelets: 211 10*3/uL (ref 150.0–400.0)
RBC: 5.68 Mil/uL — ABNORMAL HIGH (ref 3.87–5.11)
RDW: 15.4 % (ref 11.5–15.5)
WBC: 6.2 10*3/uL (ref 4.0–10.5)

## 2019-01-21 LAB — HEMOGLOBIN A1C: Hgb A1c MFr Bld: 6.3 % (ref 4.6–6.5)

## 2019-01-21 MED ORDER — VITAMIN D (ERGOCALCIFEROL) 1.25 MG (50000 UNIT) PO CAPS: 50000.0000 [IU] | ORAL_CAPSULE | ORAL | 0 refills | Status: DC

## 2019-01-21 MED ORDER — ATORVASTATIN CALCIUM 10 MG PO TABS: 10.0000 mg | ORAL_TABLET | Freq: Every day | ORAL | 3 refills | Status: DC

## 2019-01-21 NOTE — Progress Notes (Signed)
Brenda [name] is a 68 y.o. female with the following history as recorded in [employer]:  Patient Active Problem List   Diagnosis Date Noted  . Patellar subluxation, right, initial encounter 09/23/2018  . Degenerative arthritis of knee, bilateral 03/20/2018  . Pre-diabetes 01/17/2018  . Hyperglycemia 12/24/2017  . Cough 01/22/2017  . Arthralgia 11/17/2015  . Left lumbar radiculopathy 10/25/2015  . Low back pain 08/25/2015  . Menopausal state 08/22/2011  . Routine general medical examination at a health care facility 09/28/2010  . Hyperlipidemia 04/25/2010  . HEARING LOSS 09/15/2009  . Disorder of bone and cartilage 04/20/2007    Current Outpatient Medications  Medication Sig Dispense Refill  . atorvastatin (LIPITOR) 10 MG tablet Take 1 tablet (10 mg total) by mouth daily. 90 tablet 3  . Cholecalciferol (VITAMIN D) 2000 units tablet Take 2,000 Units by mouth daily.    . hydrocortisone 2.5 % cream APPLY A THIN LAYER TO FACE TWICE DAILY AS NEEDED FOR 1 WEEK ON 1 WEEK OFF AS NEEDED FOR FLARES  2  . hydrOXYzine (ATARAX/VISTARIL) 25 MG tablet Take 1 tablet (25 mg total) by mouth every 8 (eight) hours as needed. 90 tablet 3  . IRON, FERROUS GLUCONATE, PO Take 65 mg by mouth daily.    . metroNIDAZOLE (METROCREAM) 0.75 % cream APPLY ON THE SKIN TWICE DAILY  2  . triamcinolone cream (KENALOG) 0.1 % Apply 1 application topically 4 (four) times daily. As needed for rash or itching 30 g 2  . Turmeric 500 MG TABS Take 1 tablet by mouth daily.    . Vitamin D, Ergocalciferol, (DRISDOL) 1.25 MG (50000 UT) CAPS capsule Take 1 capsule (50,000 Units total) by mouth every 7 (seven) days. 12 capsule 0  . gabapentin (NEURONTIN) 100 MG capsule Take 2 capsules (200 mg total) by mouth at bedtime. (Patient not taking: Reported on 01/21/2019) 60 capsule 3   No current facility-administered medications for this visit.     Allergies: Hydrocodone  Past Medical History:  Diagnosis Date  . ABDOMINAL/PELVIC SWELLING  MASS/LUMP UNSPEC SITE 09/18/2007  . ACUTE URIS OF UNSPECIFIED SITE 06/06/2007  . Allergy   . Anemia   . Anxiety    no meds  . ASTHMATIC BRONCHITIS, ACUTE 05/11/2008  . DM 09/18/2007   borderline -diet controlled- no meds  . GERD (gastroesophageal reflux disease)   . Glucose intolerance (impaired glucose tolerance)   . HEARING LOSS 09/15/2009    mild -no hearing aids  . HYPERLIPIDEMIA 04/25/2010  . OSTEOPENIA 04/20/2007  . Palpitations 03/13/2008  . THYROID NODULE, RIGHT 05/11/2008    Past Surgical History:  Procedure Laterality Date  . DILATION AND CURETTAGE, DIAGNOSTIC / THERAPEUTIC    . HAMMER TOE SURGERY     RIGHT  . HERNIA REPAIR  1992  . KNEE SURGERY  1991   Left  . Left Knee cyst  1985  . Lymph Node Removal      Family History  Problem Relation Age of Onset  . Cancer Mother        Breast Cancer  . Breast cancer Mother   . Cancer Father        "Stomach" Cancer  . Stomach cancer Father   . Cancer Sister        Colon Cancer  . Breast cancer Sister   . Kidney disease Brother        Kidney Transplant  . Colon cancer Neg Hx   . Esophageal cancer Neg Hx   . Rectal cancer Neg  Hx     Social History   Tobacco Use  . Smoking status: Never Smoker  . Smokeless tobacco: Never Used  Substance Use Topics  . Alcohol use: Yes    Alcohol/week: 14.0 standard drinks    Types: 14 Glasses of wine per week    Subjective:  Patient presents for TOC from provider who has left our practice. Would like to get her flu shot and shingles vaccine today.  History of hyperlipidemia and pre-diabetes.  Sees Dr. Matthew Saras for CPE/ GYN needs- scheduled to see 03/2019; will contact her GYN to get copies of records.  Exercises daily- rides stationary bike daily; daily small weights; Up to date on dentist and eye doctor; Goes to [place] yearly- they have been managing PT;   Health Maintenance  Topic Date Due  . MAMMOGRAM  11/28/2013  . URINE MICROALBUMIN  12/26/2017  . DEXA SCAN  01/17/2018  .  INFLUENZA VACCINE  11/09/2018  . PNA vac Low Risk Adult (2 of 2 - PPSV23) 01/18/2019  . TETANUS/TDAP  11/19/2024  . COLONOSCOPY  09/16/2025  . Hepatitis C Screening  Completed     Objective:  Vitals:   01/21/19 0911  BP: 106/78  Pulse: 92  Temp: 98.1 F (36.7 C)  TempSrc: Oral  SpO2: 96%  Weight: 159 lb 12.8 oz (72.5 kg)  Height: _0  (1.651 m)    General: Well developed, well nourished, in no acute distress  Skin : Warm and dry.  Head: Normocephalic and atraumatic  Eyes: Sclera and conjunctiva clear; pupils round and reactive to light; extraocular movements intact  Ears: External normal; canals clear; tympanic membranes normal  Oropharynx: Pink, supple. No suspicious lesions  Neck: Supple without thyromegaly, adenopathy  Lungs: Respirations unlabored; clear to auscultation bilaterally without wheeze, rales, rhonchi  CVS exam: normal rate and regular rhythm.  Abdomen: Soft; nontender; nondistended; normoactive bowel sounds; no masses or hepatosplenomegaly  Musculoskeletal: No deformities; no active joint inflammation  Extremities: No edema, cyanosis, clubbing  Vessels: Symmetric bilaterally  Neurologic: Alert and oriented; speech intact; face symmetrical; moves all extremities well; CNII-XII intact without focal deficit   Assessment:  1. PE (physical exam), annual   2. Hyperlipidemia, unspecified hyperlipidemia type   3. Pre-diabetes   4. Vitamin D deficiency   5. Angular cheilitis     Plan:  Age appropriate preventive healthcare needs addressed; encouraged regular eye doctor and dental exams; encouraged regular exercise and weight loss goals; will update labs and refills as needed today; follow-up to be determined; Keep regular follow-up with GYN, referral updated to dermatology;  Shingrix #2 due in 2 months.   No follow-ups on file.  Orders Placed This Encounter  Procedures  . CBC w/Diff    Standing Status:   Future    Standing Expiration Date:   01/21/2020  .  Comp Met (CMET)    Standing Status:   Future    Standing Expiration Date:   01/21/2020  . Lipid panel    Standing Status:   Future    Standing Expiration Date:   01/21/2020  . HgB A1c    Standing Status:   Future    Standing Expiration Date:   01/21/2020  . Vitamin D (25 hydroxy)    Standing Status:   Future    Standing Expiration Date:   01/21/2020  . Ambulatory referral to Dermatology    Referral Priority:   Routine    Referral Type:   Consultation    Referral Reason:   Specialty  Services Required    Requested Specialty:   Dermatology    Number of Visits Requested:   1    Requested Prescriptions    No prescriptions requested or ordered in this encounter

## 2019-01-21 NOTE — Addendum Note (Signed)
Addended by: Marcina Millard on: 01/21/2019 10:14 AM   Modules accepted: Orders

## 2019-02-20 ENCOUNTER — Encounter: Payer: Self-pay | Admitting: Family Medicine

## 2019-03-12 NOTE — Progress Notes (Signed)
[name] [place], [place] [address] Phone: [phone] Subjective:   [name], am serving as a scribe for Dr. Hulan Saas.  I'm seeing this patient by the request  of:    This visit occurred during the SARS-CoV-2 public health emergency.  Safety protocols were in place, including screening questions prior to the visit, additional usage of staff PPE, and extensive cleaning of exam room while observing appropriate contact time as indicated for disinfecting solutions.    CC: Knee pain and back pain follow-up  [id number]   10/22/2018 Injection given today.  Tolerated the procedure well.  Could be a candidate for Visco supplementation.  Start formal physical therapy.  Discussed which activities to do avoid.  Patient should increase activity as tolerated.  Follow-up again 6 weeks.  Update 03/13/2019 SOPHIANA SCALISI is a 68 y.o. female coming in with complaint of right knee pain. She said that she is doing better since last visit.  Patient is a still some pain from time to time but nothing no severe.  Patient has arthritic changes of the knees bilaterally.  States that she is having cramps in her legs when she is standing for prolonged periods and when lying on her back. Symptoms have been going on for years and is not getting any better.  Does notice association with the back pain with radicular symptoms down the legs.  States that when she rests it seems to get somewhat better.  Can be worsened when laying on her back and cramping at night.     Past Medical History:  Diagnosis Date  . ABDOMINAL/PELVIC SWELLING MASS/LUMP UNSPEC SITE 09/18/2007  . ACUTE URIS OF UNSPECIFIED SITE 06/06/2007  . Allergy   . Anemia   . Anxiety    no meds  . ASTHMATIC BRONCHITIS, ACUTE 05/11/2008  . DM 09/18/2007   borderline -diet controlled- no meds  . GERD (gastroesophageal reflux disease)   . Glucose intolerance (impaired glucose tolerance)   . HEARING  LOSS 09/15/2009    mild -no hearing aids  . HYPERLIPIDEMIA 04/25/2010  . OSTEOPENIA 04/20/2007  . Palpitations 03/13/2008  . THYROID NODULE, RIGHT 05/11/2008   Past Surgical History:  Procedure Laterality Date  . DILATION AND CURETTAGE, DIAGNOSTIC / THERAPEUTIC    . HAMMER TOE SURGERY     RIGHT  . HERNIA REPAIR  1992  . KNEE SURGERY  1991   Left  . Left Knee cyst  1985  . Lymph Node Removal     Social History   Socioeconomic History  . Marital status: Married    Spouse name: Not on file  . Number of children: Not on file  . Years of education: Not on file  . Highest education level: Not on file  Occupational History  . Occupation: Nurse, adult: [employer]  Social Needs  . Financial resource strain: Not on file  . Food insecurity    Worry: Not on file    Inability: Not on file  . Transportation needs    Medical: Not on file    Non-medical: Not on file  Tobacco Use  . Smoking status: Never Smoker  . Smokeless tobacco: Never Used  Substance and Sexual Activity  . Alcohol use: Yes    Alcohol/week: 14.0 standard drinks    Types: 14 Glasses of wine per week  . Drug use: No  . Sexual activity: Yes    Birth control/protection: Post-menopausal  Lifestyle  . Physical activity    Days per week: Not on file    Minutes per session: Not on file  . Stress: Not on file  Relationships  . Social Herbalist on phone: Not on file    Gets together: Not on file    Attends religious service: Not on file    Active member of club or organization: Not on file    Attends meetings of clubs or organizations: Not on file    Relationship status: Not on file  Other Topics Concern  . Not on file  Social History Narrative  . Not on file   Allergies  Allergen Reactions  . Hydrocodone Other (See Comments)    drowsiness   Family History  Problem Relation Age of Onset  . Cancer Mother        Breast Cancer  . Breast cancer Mother   . Cancer Father         "Stomach" Cancer  . Stomach cancer Father   . Cancer Sister        Colon Cancer  . Breast cancer Sister   . Kidney disease Brother        Kidney Transplant  . Colon cancer Neg Hx   . Esophageal cancer Neg Hx   . Rectal cancer Neg Hx      Current Outpatient Medications (Cardiovascular):  .  atorvastatin (LIPITOR) 10 MG tablet, Take 1 tablet (10 mg total) by mouth daily.    Current Outpatient Medications (Hematological):  Marland Kitchen  IRON, FERROUS GLUCONATE, PO, Take 65 mg by mouth daily.  Current Outpatient Medications (Other):  Marland Kitchen  Cholecalciferol (VITAMIN D) 2000 units tablet, Take 2,000 Units by mouth daily. Marland Kitchen  gabapentin (NEURONTIN) 100 MG capsule, Take 2 capsules (200 mg total) by mouth at bedtime. (Patient not taking: Reported on 01/21/2019) .  hydrocortisone 2.5 % cream, APPLY A THIN LAYER TO FACE TWICE DAILY AS NEEDED FOR 1 WEEK ON 1 WEEK OFF AS NEEDED FOR FLARES .  hydrOXYzine (ATARAX/VISTARIL) 25 MG tablet, Take 1 tablet (25 mg total) by mouth every 8 (eight) hours as needed. .  metroNIDAZOLE (METROCREAM) 0.75 % cream, APPLY ON THE SKIN TWICE DAILY .  triamcinolone cream (KENALOG) 0.1 %, Apply 1 application topically 4 (four) times daily. As needed for rash or itching .  Turmeric 500 MG TABS, Take 1 tablet by mouth daily. .  Vitamin D, Ergocalciferol, (DRISDOL) 1.25 MG (50000 UT) CAPS capsule, Take 1 capsule (50,000 Units total) by mouth every 7 (seven) days.    Past medical history, social, surgical and family history all reviewed in electronic medical record.  No pertanent information unless stated regarding to the chief complaint.   Review of Systems:  No headache, visual changes, nausea, vomiting, diarrhea, constipation, dizziness, abdominal pain, skin rash, fevers, chills, night sweats, weight loss, swollen lymph nodes, body aches, joint swelling, muscle aches, chest pain, shortness of breath, mood changes.   Objective  There were no vitals taken for this visit.  Systems examined below as of    General: No apparent distress alert and oriented x3 mood and affect normal, dressed appropriately.  HEENT: Pupils equal, extraocular movements intact  Respiratory: Patient's speak in full sentences and does not appear short of breath  Cardiovascular: No lower extremity edema, non tender, no erythema  Skin: Warm dry intact with no signs of infection or rash on extremities or on axial skeleton.  Abdomen: Soft nontender  Neuro: Cranial nerves II through XII  are intact, neurovascularly intact in all extremities with and 3+ DTRs of the lower extremity with brisk Lymph: No lymphadenopathy of posterior or anterior cervical chain or axillae bilaterally.  Gait antalgic gait MSK:  tender with full range of motion and good stability and symmetric strength and tone of shoulders, elbows, wrist, hip, knee and ankles bilaterally.  Moderate arthritic changes of multiple joints   Back exam i has significant loss of lordosis.  Patient's pain is out of proportion to the amount of palpation.  Patient has limited range of motion secondary to involuntary guarding and guarding.  Patient with straight leg test though unable to tolerate the testing very well.  Tightness with Corky Sox test bilaterally.  Neurovascular intact distally but once again brisk DTRs of 3+. Impression and Recommendations:     This case required medical decision making of moderate complexity. The above documentation has been reviewed and is accurate and complete Lyndal Pulley, DO       Note: This dictation was prepared with Dragon dictation along with smaller phrase technology. Any transcriptional errors that result from this process are unintentional.

## 2019-03-13 ENCOUNTER — Ambulatory Visit (INDEPENDENT_AMBULATORY_CARE_PROVIDER_SITE_OTHER)
Admission: RE | Admit: 2019-03-13 | Discharge: 2019-03-13 | Disposition: A | Discharge status: Home / Self Care | Payer: BC Managed Care – PPO | Source: Ambulatory Visit | Attending: Family Medicine | Admitting: Family Medicine

## 2019-03-13 ENCOUNTER — Ambulatory Visit (INDEPENDENT_AMBULATORY_CARE_PROVIDER_SITE_OTHER): Payer: BC Managed Care – PPO | Admitting: Family Medicine

## 2019-03-13 ENCOUNTER — Other Ambulatory Visit: Payer: Self-pay

## 2019-03-13 ENCOUNTER — Encounter: Payer: Self-pay | Admitting: Family Medicine

## 2019-03-13 VITALS — BP 112/80 | HR 88 | Ht 65.0 in | Wt 159.0 lb

## 2019-03-13 DIAGNOSIS — G8929 Other chronic pain: Secondary | ICD-10-CM

## 2019-03-13 DIAGNOSIS — M545 Low back pain, unspecified: Secondary | ICD-10-CM

## 2019-03-13 DIAGNOSIS — M5416 Radiculopathy, lumbar region: Secondary | ICD-10-CM

## 2019-03-13 NOTE — Patient Instructions (Addendum)
  [address], [address]st [address] [place], [place] [address] Phone [phone]   Medical Records: [phone]  M17.0 code MRI of back Xray of her back Follow up in 4 weeks

## 2019-03-13 NOTE — Assessment & Plan Note (Signed)
Concern for worsening left lumbar radiculopathy but now seems to be more secondary to a spinal stenosis based on the deep tendon reflexes and patient symptoms with recurrent excessive extension.  Discussed which activities to do which wants to avoid.  I do believe patient would be a candidate for possible epidural.  Patient will follow up with me again in 4 to 8 weeks

## 2019-03-31 ENCOUNTER — Other Ambulatory Visit: Payer: Self-pay | Admitting: Obstetrics and Gynecology

## 2019-03-31 DIAGNOSIS — R928 Other abnormal and inconclusive findings on diagnostic imaging of breast: Secondary | ICD-10-CM

## 2019-04-07 ENCOUNTER — Ambulatory Visit
Admission: RE | Admit: 2019-04-07 | Discharge: 2019-04-07 | Disposition: A | Discharge status: Home / Self Care | Payer: BC Managed Care – PPO | Source: Ambulatory Visit | Attending: Family Medicine | Admitting: Family Medicine

## 2019-04-07 ENCOUNTER — Other Ambulatory Visit: Payer: Self-pay | Admitting: Obstetrics and Gynecology

## 2019-04-07 ENCOUNTER — Other Ambulatory Visit: Payer: Self-pay

## 2019-04-07 DIAGNOSIS — Z803 Family history of malignant neoplasm of breast: Secondary | ICD-10-CM

## 2019-04-07 DIAGNOSIS — M545 Low back pain, unspecified: Secondary | ICD-10-CM

## 2019-04-07 DIAGNOSIS — G8929 Other chronic pain: Secondary | ICD-10-CM

## 2019-04-08 ENCOUNTER — Telehealth: Payer: Self-pay

## 2019-04-08 ENCOUNTER — Other Ambulatory Visit: Payer: Self-pay | Admitting: Family

## 2019-04-08 NOTE — Telephone Encounter (Signed)
Copied from [place] [id number]. Topic: General - Other >> Apr 08, 2019 11:29 AM Yvette Rack wrote: Reason for CRM: Pt called in regarding her second shingles vaccination. Pt stated she would like to schedule the appt but she also would like to know if she will be required to pay another co-pay. Pt requests call back   F/u  Call patient offer appt on 04/09/19 @ 10am

## 2019-04-08 NOTE — Telephone Encounter (Signed)
Copied from [place] [id number]. Topic: Quick Communication - Rx Refill/Question >> Apr 08, 2019 11:26 AM Yvette Rack wrote: Medication: Vitamin D, Ergocalciferol, (DRISDOL) 1.25 MG (50000 UT) CAPS capsule  Has the patient contacted their pharmacy? yes   Preferred Pharmacy (with phone number or street name): [facility], [address]  Phone: [phone]  Fax: [phone]  Agent: Please be advised that RX refills may take up to 3 business days. We ask that you follow-up with your pharmacy.

## 2019-04-08 NOTE — Telephone Encounter (Signed)
Requested medication (s) are due for refill today: yes  Requested medication (s) are on the active medication list: yes  Last refill:  01/21/2019  Future visit scheduled: yes  Notes to clinic:  This refill cannot be delegated    Requested Prescriptions  Pending Prescriptions Disp Refills   Vitamin D, Ergocalciferol, (DRISDOL) 1.25 MG (50000 UT) CAPS capsule 12 capsule 0    Sig: Take 1 capsule (50,000 Units total) by mouth every 7 (seven) days.      Endocrinology:  Vitamins - Vitamin D Supplementation Failed - 04/08/2019 11:30 AM      Failed - 50,000 IU strengths are not delegated      Failed - Phosphate in normal range and within 360 days    No results found for: PHOS        Failed - Vitamin D in normal range and within 360 days    VITD  Date Value Ref Range Status  01/21/2019 14.27 (L) 30.00 - 100.00 ng/mL Final          Passed - Ca in normal range and within 360 days    Calcium  Date Value Ref Range Status  01/21/2019 9.0 8.4 - 10.5 mg/dL Final          Passed - Valid encounter within last 12 months    Recent Outpatient Visits           3 weeks ago Chronic low back pain, unspecified back pain laterality, unspecified whether sciatica present   [place], [place], DO   2 months ago PE (physical exam), annual   [place], Marvis Repress, FNP   5 months ago Chronic pain of right knee   [place], [place], DO   6 months ago Acute pain of right knee   [place], [place], DO   11 months ago Primary osteoarthritis of both [place], [place], DO       Future Appointments             In 1 week Lyndal Pulley, [place]

## 2019-04-09 ENCOUNTER — Other Ambulatory Visit: Payer: Self-pay

## 2019-04-09 ENCOUNTER — Ambulatory Visit (INDEPENDENT_AMBULATORY_CARE_PROVIDER_SITE_OTHER): Payer: BC Managed Care – PPO

## 2019-04-09 DIAGNOSIS — Z23 Encounter for immunization: Secondary | ICD-10-CM | POA: Diagnosis not present

## 2019-04-09 DIAGNOSIS — Z299 Encounter for prophylactic measures, unspecified: Secondary | ICD-10-CM

## 2019-04-10 ENCOUNTER — Ambulatory Visit: Payer: BC Managed Care – PPO

## 2019-04-10 ENCOUNTER — Ambulatory Visit
Admission: RE | Admit: 2019-04-10 | Discharge: 2019-04-10 | Disposition: A | Discharge status: Home / Self Care | Payer: BC Managed Care – PPO | Source: Ambulatory Visit | Attending: Obstetrics and Gynecology | Admitting: Obstetrics and Gynecology

## 2019-04-10 DIAGNOSIS — R928 Other abnormal and inconclusive findings on diagnostic imaging of breast: Secondary | ICD-10-CM

## 2019-04-14 NOTE — Progress Notes (Signed)
[facility] [place] Phone: [phone] Subjective:   B[facility]da [name], am serving as a scribe for Dr. Hulan Saas. This visit occurred during the SARS-CoV-2 public health emergency.  Safety protocols were in place, including screening questions prior to the visit, additional usage of staff PPE, and extensive cleaning of exam room while observing appropriate contact time as indicated for disinfecting solutions.   I'm seeing this patient by the request  of:    CC: Low back pain follow-up  [id number]   03/13/2019 Concern for worsening left lumbar radiculopathy but now seems to be more secondary to a spinal stenosis based on the deep tendon reflexes and patient symptoms with recurrent excessive extension.  Discussed which activities to do which wants to avoid.  I do believe patient would be a candidate for possible epidural.  Patient will follow up with me again in 4 to 8 weeks  Update 04/15/2019: Brenda [name] is a 69 y.o. female coming in with complaint of back pain. Patient states that she had an increase in pain with all of the cooking she has been doing over the holidays. Patient does not want to get an epidural. Sitting down alleviates her pain. Using Aleve in addition.  Patient did get an MRI.  MRI showed nerve root impingement at S1 bilaterally that is consistent with patient pain.  Patient had declined epidural.  Continues to decline it.  Wanting to know what else she can possibly do    Past Medical History:  Diagnosis Date  . ABDOMINAL/PELVIC SWELLING MASS/LUMP UNSPEC SITE 09/18/2007  . ACUTE URIS OF UNSPECIFIED SITE 06/06/2007  . Allergy   . Anemia   . Anxiety    [name] meds  . ASTHMATIC BRONCHITIS, ACUTE 05/11/2008  . DM 09/18/2007   borderline -diet controlled- [name] meds  . GERD (gastroesophageal reflux disease)   . Glucose intolerance (impaired glucose tolerance)   . HEARING LOSS 09/15/2009    mild -[name] hearing aids  .  HYPERLIPIDEMIA 04/25/2010  . OSTEOPENIA 04/20/2007  . Palpitations 03/13/2008  . THYROID NODULE, RIGHT 05/11/2008   Past Surgical History:  Procedure Laterality Date  . DILATION AND CURETTAGE, DIAGNOSTIC / THERAPEUTIC    . HAMMER TOE SURGERY     RIGHT  . HERNIA REPAIR  1992  . KNEE SURGERY  1991   Left  . Left Knee cyst  1985  . Lymph Node Removal     Social History   Socioeconomic History  . Marital status: Married    Spouse name: Not on file  . Number of children: Not on file  . Years of education: Not on file  . Highest education level: Not on file  Occupational History  . Occupation: Nurse, adult: [employer]  Tobacco Use  . Smoking status: Never Smoker  . Smokeless tobacco: Never Used  Substance and Sexual Activity  . Alcohol use: Yes    Alcohol/week: 14.0 standard drinks    Types: 14 Glasses of wine per week  . Drug use: [name]  . Sexual activity: Yes    Birth control/protection: Post-menopausal  Other Topics Concern  . Not on file  Social History Narrative  . Not on file   Social Determinants of Health   Financial Resource Strain:   . Difficulty of Paying Living Expenses: Not on file  Food Insecurity:   . Worried About Charity fundraiser in the Last Year: Not on file  .  Ran Out of Food in the Last Year: Not on file  Transportation Needs:   . Lack of Transportation (Medical): Not on file  . Lack of Transportation (Non-Medical): Not on file  Physical Activity:   . Days of Exercise per Week: Not on file  . Minutes of Exercise per Session: Not on file  Stress:   . Feeling of Stress : Not on file  Social Connections:   . Frequency of Communication with Friends and Family: Not on file  . Frequency of Social Gatherings with Friends and Family: Not on file  . Attends Religious Services: Not on file  . Active Member of Clubs or Organizations: Not on file  . Attends Archivist Meetings: Not on file  . Marital Status: Not on  file   Allergies  Allergen Reactions  . Hydrocodone Other (See Comments)    drowsiness   Family History  Problem Relation Age of Onset  . Cancer Mother        Breast Cancer  . Breast cancer Mother   . Cancer Father        "Stomach" Cancer  . Stomach cancer Father   . Cancer Sister        Colon Cancer  . Breast cancer Sister   . Kidney disease Brother        Kidney Transplant  . Colon cancer Neg Hx   . Esophageal cancer Neg Hx   . Rectal cancer Neg Hx      Current Outpatient Medications (Cardiovascular):  .  atorvastatin (LIPITOR) 10 MG tablet, Take 1 tablet (10 mg total) by mouth daily.    Current Outpatient Medications (Hematological):  Marland Kitchen  IRON, FERROUS GLUCONATE, PO, Take 65 mg by mouth daily.  Current Outpatient Medications (Other):  Marland Kitchen  Cholecalciferol (VITAMIN D) 2000 units tablet, Take 2,000 Units by mouth daily. Marland Kitchen  gabapentin (NEURONTIN) 100 MG capsule, Take 2 capsules (200 mg total) by mouth at bedtime. .  hydrocortisone 2.5 % cream, APPLY A THIN LAYER TO FACE TWICE DAILY AS NEEDED FOR 1 WEEK ON 1 WEEK OFF AS NEEDED FOR FLARES .  hydrOXYzine (ATARAX/VISTARIL) 25 MG tablet, Take 1 tablet (25 mg total) by mouth every 8 (eight) hours as needed. .  metroNIDAZOLE (METROCREAM) 0.75 % cream, APPLY ON THE SKIN TWICE DAILY .  triamcinolone cream (KENALOG) 0.1 %, Apply 1 application topically 4 (four) times daily. As needed for rash or itching .  Turmeric 500 MG TABS, Take 1 tablet by mouth daily. .  Vitamin D, Ergocalciferol, (DRISDOL) 1.25 MG (50000 UT) CAPS capsule, Take 1 capsule (50,000 Units total) by mouth every 7 (seven) days.    Past medical history, social, surgical and family history all reviewed in electronic medical record.  [name] pertanent information unless stated regarding to the chief complaint.   Review of Systems:  [name] headache, visual changes, nausea, vomiting, diarrhea, constipation, dizziness, abdominal pain, skin rash, fevers, chills, night sweats,  weight loss, swollen lymph nodes,  joint swelling,  chest pain, shortness of breath, mood changes.  Positive muscle aches, body aches  Objective  Blood pressure 110/68, pulse 75, height 5\' 5"  (1.651 m), weight 162 lb (73.5 kg), SpO2 98 %.    General: [name] apparent distress alert and oriented x3 mood and affect normal, dressed appropriately.  HEENT: Pupils equal, extraocular movements intact  Respiratory: Patient's speak in full sentences and does not appear short of breath  Cardiovascular: [name] lower extremity edema, non tender, [name] erythema  Skin: Warm dry  intact with [name] signs of infection or rash on extremities or on axial skeleton.  Abdomen: Soft nontender  Neuro: Cranial nerves II through XII are intact, neurovascularly intact in all extremities with 2+ pulses.  Lymph: [name] lymphadenopathy of posterior or anterior cervical chain or axillae bilaterally.  Gait mild antalgic MSK:  tender with mild limited range of motion and good stability and symmetric strength and tone of shoulders, elbows, wrist, hip, knee and ankles bilaterally.   Low back exam still shows the patient has some loss of lordosis.  Some tenderness noted in the paraspinal musculature of the lumbar spine right greater than left.  Patient does have positive straight leg test noted on the left side at 20 degrees of forward flexion.  Deep tendon reflexes though appear to be minorly better than previous exam but still minorly diminished.  This is the Achilles left greater than right   Impression and Recommendations:     This case required medical decision making of moderate complexity. The above documentation has been reviewed and is accurate and complete Lyndal Pulley, DO       Note: This dictation was prepared with Dragon dictation along with smaller phrase technology. Any transcriptional errors that result from this process are unintentional.

## 2019-04-15 ENCOUNTER — Ambulatory Visit (INDEPENDENT_AMBULATORY_CARE_PROVIDER_SITE_OTHER): Payer: BC Managed Care – PPO | Admitting: Family Medicine

## 2019-04-15 ENCOUNTER — Other Ambulatory Visit: Payer: Self-pay

## 2019-04-15 ENCOUNTER — Encounter: Payer: Self-pay | Admitting: Family Medicine

## 2019-04-15 VITALS — BP 110/68 | HR 75 | Ht 65.0 in | Wt 162.0 lb

## 2019-04-15 DIAGNOSIS — M5416 Radiculopathy, lumbar region: Secondary | ICD-10-CM | POA: Diagnosis not present

## 2019-04-15 MED ORDER — METHYLPREDNISOLONE ACETATE 40 MG/ML IJ SUSP
40.0000 mg | Freq: Once | INTRAMUSCULAR | Status: AC
  Administered 2019-04-15: 40 mg via INTRAMUSCULAR

## 2019-04-15 MED ORDER — KETOROLAC TROMETHAMINE 30 MG/ML IJ SOLN
30.0000 mg | Freq: Once | INTRAMUSCULAR | Status: AC
  Administered 2019-04-15: 30 mg via INTRAMUSCULAR

## 2019-04-15 NOTE — Patient Instructions (Signed)
Good to see you  I think it is on the nerve root at S1 giving you the pain.  I do believe an epidural or a nerve root injection would be helpful Try the gabapentin nightly until pain is away.  Ice 20 minutes 2 times daily. Usually after activity and before bed. Toradol and depomedrol given to help with pain  Otherwise only other option is to discuss surgery  See me again in 4 weeks

## 2019-04-16 ENCOUNTER — Encounter: Payer: Self-pay | Admitting: Family Medicine

## 2019-04-16 NOTE — Assessment & Plan Note (Signed)
Patient continues to have the lumbar radiculopathy.  Patient's MRI did show that there is a nerve root impingement especially on S1 left greater than right.  We discussed the possibility of epidurals and nerve root injections as well as the possibility of radiofrequency ablation in great length.  Patient has decided that none of these do seem like options to her at the moment and would like to discuss with her family and other providers.  Discussed with her otherwise taking the gabapentin on a regular basis would be more beneficial which patient has been fairly noncompliant with at the moment.  We discussed repeating formal physical therapy which patient has failed in the past and patient wants to decline that at the moment.  Otherwise injections given today to help with some pain relief.  Follow-up with me again 4 to 6 weeks spent  35 minutes with patient face-to-face and had greater than 50% of counseling including as described above in assessment and plan.

## 2019-04-21 ENCOUNTER — Other Ambulatory Visit: Payer: Self-pay | Admitting: Family

## 2019-05-05 ENCOUNTER — Ambulatory Visit
Admission: RE | Admit: 2019-05-05 | Discharge: 2019-05-05 | Disposition: A | Discharge status: Home / Self Care | Payer: BC Managed Care – PPO | Source: Ambulatory Visit | Attending: Obstetrics and Gynecology | Admitting: Obstetrics and Gynecology

## 2019-05-05 DIAGNOSIS — Z803 Family history of malignant neoplasm of breast: Secondary | ICD-10-CM

## 2019-05-05 MED ORDER — GADOBUTROL 1 MMOL/ML IV SOLN
7.0000 mL | Freq: Once | INTRAVENOUS | Status: AC | PRN
  Administered 2019-05-05: 11:00:00 7 mL via INTRAVENOUS

## 2019-05-06 ENCOUNTER — Other Ambulatory Visit: Payer: Self-pay | Admitting: Obstetrics and Gynecology

## 2019-05-06 DIAGNOSIS — R9389 Abnormal findings on diagnostic imaging of other specified body structures: Secondary | ICD-10-CM

## 2019-05-12 NOTE — Progress Notes (Signed)
[place] Phone: [phone] Subjective:   Brenda [name], am serving as a scribe for Dr. Hulan Saas. This visit occurred during the SARS-CoV-2 public health emergency.  Safety protocols were in place, including screening questions prior to the visit, additional usage of staff PPE, and extensive cleaning of exam room while observing appropriate contact time as indicated for disinfecting solutions.   I'm seeing this patient by the request  of:  Marrian Salvage, FNP  CC: Low back pain follow-up  [id number]   04/15/2019 Patient continues to have the lumbar radiculopathy.  Patient's MRI did show that there is a nerve root impingement especially on S1 left greater than right.  We discussed the possibility of epidurals and nerve root injections as well as the possibility of radiofrequency ablation in great length.  Patient has decided that none of these do seem like options to her at the moment and would like to discuss with her family and other providers.  Discussed with her otherwise taking the gabapentin on a regular basis would be more beneficial which patient has been fairly noncompliant with at the moment.  We discussed repeating formal physical therapy which patient has failed in the past and patient wants to decline that at the moment.  Otherwise injections given today to help with some pain relief.  Follow-up with me again 4 to 6 weeks spent  35 minutes with patient face-to-face and had greater than 50% of counseling including as described above in assessment and plan.  Update 05/13/2019 Brenda [name] is a 69 y.o. female coming in with complaint of back pain. Patient states that she has been getting worse since last visit. If she sits on either side she will experiencing cramps in that side. If she shifts to other side pain subsides. Has been doing massage when she gets the cramps. Is seeing an ortho tomorrow  through [place]. Has been using gabapentin daily.    Reviewed patient's notes and greetings entirety.  Patient's last office visit was April 15, 2019.  Patient's previous imaging including MRI of the lumbar spine was independently visualized by me.  Patient found to have mild disc bulge and facet hypertrophy at L4-L5 and L5-S1 causing some impingement on the descending S1 nerve roots bilaterally patient at last visit declined epidurals as well as physical therapy and was encouraged to take gabapentin on a more regular basis.  Past Medical History:  Diagnosis Date  . ABDOMINAL/PELVIC SWELLING MASS/LUMP UNSPEC SITE 09/18/2007  . ACUTE URIS OF UNSPECIFIED SITE 06/06/2007  . Allergy   . Anemia   . Anxiety    [name] meds  . ASTHMATIC BRONCHITIS, ACUTE 05/11/2008  . DM 09/18/2007   borderline -diet controlled- [name] meds  . GERD (gastroesophageal reflux disease)   . Glucose intolerance (impaired glucose tolerance)   . HEARING LOSS 09/15/2009    mild -[name] hearing aids  . HYPERLIPIDEMIA 04/25/2010  . OSTEOPENIA 04/20/2007  . Palpitations 03/13/2008  . THYROID NODULE, RIGHT 05/11/2008   Past Surgical History:  Procedure Laterality Date  . DILATION AND CURETTAGE, DIAGNOSTIC / THERAPEUTIC    . HAMMER TOE SURGERY     RIGHT  . HERNIA REPAIR  1992  . KNEE SURGERY  1991   Left  . Left Knee cyst  1985  . Lymph Node Removal     Social History   Socioeconomic History  . Marital status: Married    Spouse name: Not on file  .  Number of children: Not on file  . Years of education: Not on file  . Highest education level: Not on file  Occupational History  . Occupation: Nurse, adult: [employer]  Tobacco Use  . Smoking status: Never Smoker  . Smokeless tobacco: Never Used  Substance and Sexual Activity  . Alcohol use: Yes    Alcohol/week: 14.0 standard drinks    Types: 14 Glasses of wine per week  . Drug use: [name]  . Sexual activity: Yes    Birth control/protection: Post-menopausal    Other Topics Concern  . Not on file  Social History Narrative  . Not on file   Social Determinants of Health   Financial Resource Strain:   . Difficulty of Paying Living Expenses: Not on file  Food Insecurity:   . Worried About Charity fundraiser in the Last Year: Not on file  . Ran Out of Food in the Last Year: Not on file  Transportation Needs:   . Lack of Transportation (Medical): Not on file  . Lack of Transportation (Non-Medical): Not on file  Physical Activity:   . Days of Exercise per Week: Not on file  . Minutes of Exercise per Session: Not on file  Stress:   . Feeling of Stress : Not on file  Social Connections:   . Frequency of Communication with Friends and Family: Not on file  . Frequency of Social Gatherings with Friends and Family: Not on file  . Attends Religious Services: Not on file  . Active Member of Clubs or Organizations: Not on file  . Attends Archivist Meetings: Not on file  . Marital Status: Not on file   Allergies  Allergen Reactions  . Hydrocodone Other (See Comments)    drowsiness   Family History  Problem Relation Age of Onset  . Cancer Mother        Breast Cancer  . Breast cancer Mother   . Cancer Father        "Stomach" Cancer  . Stomach cancer Father   . Cancer Sister        Colon Cancer  . Breast cancer Sister   . Kidney disease Brother        Kidney Transplant  . Colon cancer Neg Hx   . Esophageal cancer Neg Hx   . Rectal cancer Neg Hx      Current Outpatient Medications (Cardiovascular):  .  atorvastatin (LIPITOR) 10 MG tablet, Take 1 tablet (10 mg total) by mouth daily.    Current Outpatient Medications (Hematological):  Marland Kitchen  IRON, FERROUS GLUCONATE, PO, Take 65 mg by mouth daily.  Current Outpatient Medications (Other):  Marland Kitchen  Cholecalciferol (VITAMIN D) 2000 units tablet, Take 2,000 Units by mouth daily. Marland Kitchen  gabapentin (NEURONTIN) 100 MG capsule, Take 2 capsules (200 mg total) by mouth at bedtime. .   hydrocortisone 2.5 % cream, APPLY A THIN LAYER TO FACE TWICE DAILY AS NEEDED FOR 1 WEEK ON 1 WEEK OFF AS NEEDED FOR FLARES .  hydrOXYzine (ATARAX/VISTARIL) 25 MG tablet, Take 1 tablet (25 mg total) by mouth every 8 (eight) hours as needed. .  metroNIDAZOLE (METROCREAM) 0.75 % cream, APPLY ON THE SKIN TWICE DAILY .  triamcinolone cream (KENALOG) 0.1 %, Apply 1 application topically 4 (four) times daily. As needed for rash or itching .  Turmeric 500 MG TABS, Take 1 tablet by mouth daily. .  Vitamin D, Ergocalciferol, (DRISDOL) 1.25 MG (50000 UNIT) CAPS capsule,  Take 1 capsule (50,000 Units total) by mouth every 7 (seven) days.   Reviewed prior external information including notes and imaging from  primary care provider As well as notes that were available from care everywhere and other healthcare systems.  Past medical history, social, surgical and family history all reviewed in electronic medical record.  [name] pertanent information unless stated regarding to the chief complaint.   Review of Systems:  [name] headache, visual changes, nausea, vomiting, diarrhea, constipation, dizziness, abdominal pain, skin rash, fevers, chills, night sweats, weight loss, swollen lymph nodes, body aches, joint swelling, chest pain, shortness of breath, mood changes. POSITIVE muscle aches  Objective  Blood pressure 112/84, pulse 89, height 5\' 5"  (1.651 m), weight 165 lb (74.8 kg), SpO2 98 %.   General: [name] apparent distress alert and oriented x3 mood and affect normal, dressed appropriately.  HEENT: Pupils equal, extraocular movements intact  Respiratory: Patient's speak in full sentences and does not appear short of breath  Cardiovascular: [name] lower extremity edema, non tender, [name] erythema  Skin: Warm dry intact with [name] signs of infection or rash on extremities or on axial skeleton.  Abdomen: Soft nontender  Neuro: Cranial nerves II through XII are intact, neurovascularly intact in all extremities with 2+ DTRs and 2+  pulses.  Lymph: [name] lymphadenopathy of posterior or anterior cervical chain or axillae bilaterally.  Gait normal with good balance and coordination.  MSK:  Non tender with full range of motion and good stability and symmetric strength and tone of shoulders, elbows, wrist, hip, knee and ankles bilaterally.  Back Exam:  Inspection: Loss of lordosis Motion: Flexion 45 deg, Extension 35 deg, Side Bending to 35 deg bilaterally,  Rotation to 45 deg bilaterally  SLR laying: Positive on the right XSLR laying: Negative  Palpable tenderness: Tender to palpation diffusely.Marland Kitchen FABER: Positive bilaterally. Sensory change: Gross sensation intact to all lumbar and sacral dermatomes.  Reflexes: 2+ at both patellar tendons, 2+ at achilles tendons, Babinski's downgoing.  Strength at foot  Plantar-flexion: 5/5 Dorsi-flexion: 5/5 Eversion: 5/5 Inversion: 5/5  Leg strength  Quad: 5/5 Hamstring: 5/5 Hip flexor: 5/5 Hip abductors: 5/5   Total time with patient and review chart and imaging 34 minutes   Impression and Recommendations:     This case required medical decision making of moderate complexity. The above documentation has been reviewed and is accurate and complete Lyndal Pulley, DO       Note: This dictation was prepared with Dragon dictation along with smaller phrase technology. Any transcriptional errors that result from this process are unintentional.

## 2019-05-13 ENCOUNTER — Other Ambulatory Visit: Payer: Self-pay

## 2019-05-13 ENCOUNTER — Ambulatory Visit: Payer: BC Managed Care – PPO | Admitting: Family Medicine

## 2019-05-13 ENCOUNTER — Encounter: Payer: Self-pay | Admitting: Family Medicine

## 2019-05-13 DIAGNOSIS — M5416 Radiculopathy, lumbar region: Secondary | ICD-10-CM | POA: Diagnosis not present

## 2019-05-13 MED ORDER — VITAMIN D (ERGOCALCIFEROL) 1.25 MG (50000 UNIT) PO CAPS: 50000.0000 [IU] | ORAL_CAPSULE | ORAL | 0 refills | Status: DC

## 2019-05-13 NOTE — Assessment & Plan Note (Signed)
Left sided S1 still noted, discussed HEP, discussed which activities  ROM given Discussed HEP.  Discussed nerves.  RTC 4 weeks

## 2019-05-13 NOTE — Patient Instructions (Signed)
Ask about epidural vs nerve root injection Send me a message after meeting with [place] doc

## 2019-05-22 ENCOUNTER — Ambulatory Visit
Admission: RE | Admit: 2019-05-22 | Discharge: 2019-05-22 | Disposition: A | Discharge status: Home / Self Care | Payer: BC Managed Care – PPO | Source: Ambulatory Visit | Attending: Obstetrics and Gynecology | Admitting: Obstetrics and Gynecology

## 2019-05-22 ENCOUNTER — Other Ambulatory Visit: Payer: Self-pay | Admitting: Radiology

## 2019-05-22 ENCOUNTER — Other Ambulatory Visit: Payer: Self-pay

## 2019-05-22 DIAGNOSIS — R9389 Abnormal findings on diagnostic imaging of other specified body structures: Secondary | ICD-10-CM

## 2019-05-22 HISTORY — PX: BREAST BIOPSY: SHX20

## 2019-05-22 MED ORDER — GADOBUTROL 1 MMOL/ML IV SOLN
8.0000 mL | Freq: Once | INTRAVENOUS | Status: AC | PRN
  Administered 2019-05-22: 8 mL via INTRAVENOUS

## 2019-06-09 ENCOUNTER — Other Ambulatory Visit: Payer: Self-pay | Admitting: General Surgery

## 2019-06-09 DIAGNOSIS — R928 Other abnormal and inconclusive findings on diagnostic imaging of breast: Secondary | ICD-10-CM

## 2019-06-23 ENCOUNTER — Other Ambulatory Visit: Payer: Self-pay | Admitting: General Surgery

## 2019-06-23 DIAGNOSIS — R928 Other abnormal and inconclusive findings on diagnostic imaging of breast: Secondary | ICD-10-CM

## 2019-08-07 ENCOUNTER — Other Ambulatory Visit (HOSPITAL_COMMUNITY): Payer: BC Managed Care – PPO

## 2019-08-08 ENCOUNTER — Other Ambulatory Visit (HOSPITAL_COMMUNITY): Payer: BC Managed Care – PPO

## 2019-08-12 NOTE — Progress Notes (Addendum)
[place], [place] [address] Phone: [phone] Fax: [phone]  [facility] [place] [facility] [address] [place], [place] [address] [place] [address] Phone: [phone] Fax: [phone]      Your procedure is scheduled on Tuesday, Aug 19, 2019.  Report to [facility] Main Entrance "A" at 5:30 A.M., and check in at the Admitting office.  Call this number if you have problems the morning of surgery:  [phone]  Call [phone] if you have any questions prior to your surgery date Monday-Friday 8am-4pm    Remember:  Do not eat after midnight the night before your surgery  You may drink clear liquids until 4:30 AM the morning of your surgery.   Clear liquids allowed are: Water, Non-Citrus Juices (without pulp), Carbonated Beverages, Clear Tea, Black Coffee Only, and Gatorade   Please complete your PRE-SURGERY G2 that was provided to you by 4:30 AM the morning of surgery.  Please, if able, drink it in one setting. DO NOT SIP.    Take these medicines the morning of surgery with A SIP OF WATER:      atorvastatin (LIPITOR)  If needed: acetaminophen (TYLENOL)  gabapentin (NEURONTIN) hydrOXYzine (ATARAX/VISTARIL)   Follow your surgeon's instructions on when to stop Aspirin.  If no instructions were given by your surgeon then you will need to call the office to get those instructions.     As of today, STOP taking any Aspirin containing products, Aleve, Naproxen, Ibuprofen, Motrin, Advil, Goody's, BC's, all herbal medications, fish oil, and all vitamins.   HOW TO MANAGE YOUR DIABETES BEFORE AND AFTER SURGERY  Why is it important to control my blood sugar before and after surgery? . Improving blood sugar levels before and after surgery helps healing and can limit problems. . A way of improving blood sugar control is eating a healthy diet by: o  Eating less sugar and  carbohydrates o  Increasing activity/exercise o  Talking with your doctor about reaching your blood sugar goals . High blood sugars (greater than 180 mg/dL) can raise your risk of infections and slow your recovery, so you will need to focus on controlling your diabetes during the weeks before surgery. . Make sure that the doctor who takes care of your diabetes knows about your planned surgery including the date and location.  How do I manage my blood sugar before surgery? . Check your blood sugar at least 4 times a day, starting 2 days before surgery, to make sure that the level is not too high or low. . Check your blood sugar the morning of your surgery when you wake up and every 2 hours until you get to the Short Stay unit. o If your blood sugar is less than 70 mg/dL, you will need to treat for low blood sugar: - Do not take insulin. - Treat a low blood sugar (less than 70 mg/dL) with  cup of clear juice (cranberry or apple), 4 glucose tablets, OR glucose gel. - Recheck blood sugar in 15 minutes after treatment (to make sure it is greater than 70 mg/dL). If your blood sugar is not greater than 70 mg/dL on recheck, call [phone] for further instructions. . Report your blood sugar to the short stay nurse when you get to Short Stay.  . If you are admitted to the hospital after surgery: o Your blood sugar will be checked by the staff and you will  probably be given insulin after surgery (instead of oral diabetes medicines) to make sure you have good blood sugar levels. o The goal for blood sugar control after surgery is 80-180 mg/dL.                      Do not wear jewelry, make up, or nail polish            Do not wear lotions, powders, perfumes, or deodorant.            Do not shave 48 hours prior to surgery.            Do not bring valuables to the hospital.            [facility] is not responsible for any belongings or valuables.  Do NOT Smoke (Tobacco/Vapping) or drink Alcohol 24  hours prior to your procedure If you use a CPAP at night, you may bring all equipment for your overnight stay.   Contacts, glasses, dentures or bridgework may not be worn into surgery.      For patients admitted to the hospital, discharge time will be determined by your treatment team.   Patients discharged the day of surgery will not be allowed to drive home, and someone needs to stay with them for 24 hours.    Special instructions:   [facility]- Preparing For Surgery  Before surgery, you can play an important role. Because skin is not sterile, your skin needs to be as free of germs as possible. You can reduce the number of germs on your skin by washing with CHG (chlorahexidine gluconate) Soap before surgery.  CHG is an antiseptic cleaner which kills germs and bonds with the skin to continue killing germs even after washing.    Oral Hygiene is also important to reduce your risk of infection.  Remember - BRUSH YOUR TEETH THE MORNING OF SURGERY WITH YOUR REGULAR TOOTHPASTE  Please do not use if you have an allergy to CHG or antibacterial soaps. If your skin becomes reddened/irritated stop using the CHG.  Do not shave (including legs and underarms) for at least 48 hours prior to first CHG shower. It is OK to shave your face.  Please follow these instructions carefully.   1. Shower the NIGHT BEFORE SURGERY and the MORNING OF SURGERY with CHG Soap.   2. If you chose to wash your hair, wash your hair first as usual with your normal shampoo.  3. After you shampoo, rinse your hair and body thoroughly to remove the shampoo.  4. Use CHG as you would any other liquid soap. You can apply CHG directly to the skin and wash gently with a scrungie or a clean washcloth.   5. Apply the CHG Soap to your body ONLY FROM THE NECK DOWN.  Do not use on open wounds or open sores. Avoid contact with your eyes, ears, mouth and genitals (private parts). Wash Face and genitals (private parts)  with your normal  soap.   6. Wash thoroughly, paying special attention to the area where your surgery will be performed.  7. Thoroughly rinse your body with warm water from the neck down.  8. DO NOT shower/wash with your normal soap after using and rinsing off the CHG Soap.  9. Pat yourself dry with a CLEAN TOWEL.  10. Wear CLEAN PAJAMAS to bed the night before surgery, wear comfortable clothes the morning of surgery  11. Place CLEAN SHEETS on your bed the night  of your first shower and DO NOT SLEEP WITH PETS.   Day of Surgery:   Do not apply any deodorants/lotions.  Please wear clean clothes to the hospital/surgery center.   Remember to brush your teeth WITH YOUR REGULAR TOOTHPASTE.   Please read over the following fact sheets that you were given.

## 2019-08-13 ENCOUNTER — Other Ambulatory Visit: Payer: Self-pay

## 2019-08-13 ENCOUNTER — Encounter (HOSPITAL_COMMUNITY)
Admission: RE | Admit: 2019-08-13 | Discharge: 2019-08-13 | Disposition: A | Discharge status: Home / Self Care | Payer: BC Managed Care – PPO | Source: Ambulatory Visit | Attending: General Surgery | Admitting: General Surgery

## 2019-08-13 ENCOUNTER — Encounter (HOSPITAL_COMMUNITY): Payer: Self-pay

## 2019-08-13 DIAGNOSIS — R928 Other abnormal and inconclusive findings on diagnostic imaging of breast: Secondary | ICD-10-CM | POA: Diagnosis not present

## 2019-08-13 DIAGNOSIS — Z7982 Long term (current) use of aspirin: Secondary | ICD-10-CM | POA: Insufficient documentation

## 2019-08-13 DIAGNOSIS — R7303 Prediabetes: Secondary | ICD-10-CM | POA: Diagnosis not present

## 2019-08-13 DIAGNOSIS — E785 Hyperlipidemia, unspecified: Secondary | ICD-10-CM | POA: Diagnosis not present

## 2019-08-13 DIAGNOSIS — D649 Anemia, unspecified: Secondary | ICD-10-CM | POA: Diagnosis not present

## 2019-08-13 DIAGNOSIS — Z01818 Encounter for other preprocedural examination: Secondary | ICD-10-CM | POA: Diagnosis not present

## 2019-08-13 DIAGNOSIS — Z79899 Other long term (current) drug therapy: Secondary | ICD-10-CM | POA: Insufficient documentation

## 2019-08-13 HISTORY — DX: Unilateral primary osteoarthritis, left knee: M17.12

## 2019-08-13 HISTORY — DX: Unilateral primary osteoarthritis, unspecified hip: M16.10

## 2019-08-13 HISTORY — DX: Pneumonia, unspecified organism: J18.9

## 2019-08-13 HISTORY — DX: Unilateral primary osteoarthritis, right knee: M17.11

## 2019-08-13 LAB — GLUCOSE, CAPILLARY: Glucose-Capillary: 93 mg/dL (ref 70–99)

## 2019-08-13 LAB — BASIC METABOLIC PANEL
Anion gap: 8 (ref 5–15)
BUN: 19 mg/dL (ref 8–23)
CO2: 26 mmol/L (ref 22–32)
Calcium: 8.7 mg/dL — ABNORMAL LOW (ref 8.9–10.3)
Chloride: 106 mmol/L (ref 98–111)
Creatinine, Ser: 0.7 mg/dL (ref 0.44–1.00)
GFR calc Af Amer: >60 mL/min (ref >60)
GFR calc non Af Amer: >60 mL/min (ref >60)
Glucose, Bld: 99 mg/dL (ref 70–99)
Potassium: 3.7 mmol/L (ref 3.5–5.1)
Sodium: 140 mmol/L (ref 135–145)

## 2019-08-13 LAB — CBC
HCT: 41.6 % (ref 36.0–46.0)
Hemoglobin: 12.5 g/dL (ref 12.0–15.0)
MCH: 23.2 pg — ABNORMAL LOW (ref 26.0–34.0)
MCHC: 30 g/dL (ref 30.0–36.0)
MCV: 77.3 fL — ABNORMAL LOW (ref 80.0–100.0)
Platelets: 215 10*3/uL (ref 150–400)
RBC: 5.38 MIL/uL — ABNORMAL HIGH (ref 3.87–5.11)
RDW: 16.2 % — ABNORMAL HIGH (ref 11.5–15.5)
WBC: 6.3 10*3/uL (ref 4.0–10.5)
nRBC: 0 % (ref 0.0–0.2)

## 2019-08-13 LAB — HEMOGLOBIN A1C
Hgb A1c MFr Bld: 6 % — ABNORMAL HIGH (ref 4.8–5.6)
Mean Plasma Glucose: 125.5 mg/dL

## 2019-08-13 NOTE — Progress Notes (Signed)
PCP - Dr. Marrian Salvage Cardiologist - denies  PPM/ICD - denies  Chest x-ray - N/A EKG - 08/13/2019 Stress Test - denies ECHO - denies Cardiac Cath - denies  Sleep Study - denies  DM: per patient "not diabetic, watches food intake" does not check blood sugar. CBG: 93  Blood Thinner Instructions: N/A Aspirin Instructions: Last dose 08/09/2019 per patent  ERAS Protcol - Yes PRE-SURGERY Ensure or G2- G2 given   COVID TEST- Scheduled for 08/15/2019. Patient verbalized understanding of self-quarantine instructions, appointment time and place.   Anesthesia review: YES, seed placement scheduled 08/18/2019  Patient denies shortness of breath, fever, cough and chest pain at PAT appointment  All instructions explained to the patient, with a verbal understanding of the material. Patient agrees to go over the instructions while at home for a better understanding. Patient also instructed to self quarantine after being tested for COVID-19. The opportunity to ask questions was provided.

## 2019-08-14 NOTE — Anesthesia Preprocedure Evaluation (Addendum)
Anesthesia Evaluation  Patient identified by MRN, date of birth, ID band Patient awake    Reviewed: Allergy & Precautions, NPO status , Patient's Chart, lab work & pertinent test results  Airway Mallampati: II  TM Distance: >3 FB Neck ROM: Full    Dental no notable dental hx. (+) Dental Advisory Given, Teeth Intact   Pulmonary pneumonia,    Pulmonary exam normal breath sounds clear to auscultation       Cardiovascular negative cardio ROS Normal cardiovascular exam Rhythm:Regular Rate:Normal     Neuro/Psych PSYCHIATRIC DISORDERS Anxiety negative neurological ROS     GI/Hepatic Neg liver ROS, GERD  ,  Endo/Other  diabetes  Renal/GU negative Renal ROS     Musculoskeletal  (+) Arthritis ,   Abdominal   Peds  Hematology  (+) Blood dyscrasia, anemia ,   Anesthesia Other Findings Day of surgery medications reviewed with the patient.  Reproductive/Obstetrics                            Anesthesia Physical Anesthesia Plan  ASA: II  Anesthesia Plan: General   Post-op Pain Management:    Induction: Intravenous  PONV Risk Score and Plan: 4 or greater and Ondansetron, Dexamethasone, Midazolam and Treatment may vary due to age or medical condition  Airway Management Planned: LMA  Additional Equipment:   Intra-op Plan:   Post-operative Plan: Extubation in OR  Informed Consent: I have reviewed the patients History and Physical, chart, labs and discussed the procedure including the risks, benefits and alternatives for the proposed anesthesia with the patient or authorized representative who has indicated his/her understanding and acceptance.     Dental advisory given  Plan Discussed with: CRNA  Anesthesia Plan Comments: (PAT note written 08/14/2019 by Myra Gianotti, PA-C. )       Anesthesia Quick Evaluation

## 2019-08-14 NOTE — Progress Notes (Signed)
Anesthesia Chart Review:  Case: [id number] Date/Time: 08/19/19 0715   Procedure: LEFT BREAST RADIOACTIVE SEED GUIDED EXCISIONAL BREAST BIOPSY (Left Breast)   Anesthesia type: General   Pre-op diagnosis: ABNORMAL LEFT BREAST MRI   Location: [facility] OR ROOM 02 / [place] [facility]   Surgeons: Stark Klein, MD    RSL: 08/18/19  DISCUSSION: Patient is a 69 year old female scheduled for the above procedure. She had an abnormal MRI of her left breast.  05/22/19 left breast biopsy showed atypical ductal hyperplasia.   History includes never smoker, HLD, borderline DM2, GERD, anemia, palpitations (2009), right thyroid nodule (2010).  She denied shortness of breath, cough, fever, chest pain at PAT RN visit.  Last ASA 08/09/19.  Presurgical COVID-19 test is scheduled for 08/15/19. Anesthesia team to evaluate on the day of surgery.    VS: BP 132/79   Pulse 75   Temp 36.7 C (Oral)   Resp 18   Ht 5\' 6"  (1.676 m)   Wt 74.5 kg   SpO2 99%   BMI 26.50 kg/m   PROVIDERS: Marrian Salvage, FNP is PCP    LABS: Labs reviewed: Acceptable for surgery. (all labs ordered are listed, but only abnormal results are displayed)  Labs Reviewed  HEMOGLOBIN A1C - Abnormal; Notable for the following components:      Result Value   Hgb A1c MFr Bld 6.0 (*)    All other components within normal limits  CBC - Abnormal; Notable for the following components:   RBC 5.38 (*)    MCV 77.3 (*)    MCH 23.2 (*)    RDW 16.2 (*)    All other components within normal limits  BASIC METABOLIC PANEL - Abnormal; Notable for the following components:   Calcium 8.7 (*)    All other components within normal limits  GLUCOSE, CAPILLARY    EKG: 08/13/19: NSR   CV: Denied prior stress test, echo, cath  Past Medical History:  Diagnosis Date  . ABDOMINAL/PELVIC SWELLING MASS/LUMP UNSPEC SITE 09/18/2007  . ACUTE URIS OF UNSPECIFIED SITE 06/06/2007  . Allergy   . Anemia   . Anxiety    no meds  . Arthritis of hip    08/13/2019: per patient  "both hips"  . Arthritis of knee, left   . Arthritis of right knee   . ASTHMATIC BRONCHITIS, ACUTE 05/11/2008  . DM 09/18/2007   borderline -diet controlled- no meds  . GERD (gastroesophageal reflux disease)   . Glucose intolerance (impaired glucose tolerance)   . HEARING LOSS 09/15/2009    mild -no hearing aids  . HYPERLIPIDEMIA 04/25/2010  . OSTEOPENIA 04/20/2007  . Palpitations 03/13/2008  . Pneumonia   . THYROID NODULE, RIGHT 05/11/2008    Past Surgical History:  Procedure Laterality Date  . BREAST BIOPSY Left 05/22/2019  . DILATION AND CURETTAGE, DIAGNOSTIC / THERAPEUTIC    . HAMMER TOE SURGERY     RIGHT  . HERNIA REPAIR  1992  . KNEE SURGERY  1991   Left  . Left Knee cyst  1985  . Lymph Node Removal      MEDICATIONS: . acetaminophen (TYLENOL) 500 MG tablet  . aspirin EC 81 MG tablet  . atorvastatin (LIPITOR) 10 MG tablet  . gabapentin (NEURONTIN) 100 MG capsule  . hydrOXYzine (ATARAX/VISTARIL) 25 MG tablet  . metroNIDAZOLE (METROCREAM) 0.75 % cream  . TURMERIC PO  . Vitamin D, Ergocalciferol, (DRISDOL) 1.25 MG (50000 UNIT) CAPS capsule   No current facility-administered medications for this encounter.  Myra Gianotti, PA-C Surgical Short Stay/Anesthesiology [facility] Phone [phone] [facility] Phone [phone] 08/14/2019 9:20 AM

## 2019-08-15 ENCOUNTER — Other Ambulatory Visit (HOSPITAL_COMMUNITY)
Admission: RE | Admit: 2019-08-15 | Discharge: 2019-08-15 | Disposition: A | Discharge status: Home / Self Care | Payer: BC Managed Care – PPO | Source: Ambulatory Visit | Attending: General Surgery | Admitting: General Surgery

## 2019-08-15 DIAGNOSIS — Z01812 Encounter for preprocedural laboratory examination: Secondary | ICD-10-CM | POA: Diagnosis not present

## 2019-08-15 DIAGNOSIS — Z20822 Contact with and (suspected) exposure to covid-19: Secondary | ICD-10-CM | POA: Insufficient documentation

## 2019-08-15 LAB — SARS CORONAVIRUS 2 (TAT 6-24 HRS): SARS Coronavirus 2: NEGATIVE

## 2019-08-18 ENCOUNTER — Other Ambulatory Visit: Payer: Self-pay

## 2019-08-18 ENCOUNTER — Ambulatory Visit
Admission: RE | Admit: 2019-08-18 | Discharge: 2019-08-18 | Disposition: A | Discharge status: Home / Self Care | Payer: BC Managed Care – PPO | Source: Ambulatory Visit | Attending: General Surgery | Admitting: General Surgery

## 2019-08-18 DIAGNOSIS — R928 Other abnormal and inconclusive findings on diagnostic imaging of breast: Secondary | ICD-10-CM

## 2019-08-18 NOTE — H&P (Signed)
Brenda [name] Location: [facility] Patient #: [id number] DOB: 30-Jul-1950 Unknown / Language: Cleophus Molt / Race: Black or African American Female   History of Present Illness The patient is a 69 year old female who presents with a complaint of Breast problems. Pt is a lovely 69 yo F referred for consultation by Dr. Jimmye Norman for an abnormal left breast MRI with ADH on core needle biopsy. She has a family history of breast cancer in her mother, colon cancer in her sister, and stomach cancer in her father. She has had abnormal imaging in the past requiring needle biopsies here and at [facility]. She has gotten annual breast MR due to findings and family history. This time, her mammogram in December was OK, but her MRI in january showed linear non mass enhancement around 5x5x2 cm. Core needle biopsy was performed and showed ADH. She presents to discuss excision. She has a hematoma after her biopsy. She is otherwise doing relatively well. 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   dx mammogram 04/10/2019 ACR Breast Density Category b: There are scattered areas of fibroglandular density.  FINDINGS: Spot compression views of the left breast in the MLO projection show normal appearing fibroglandular tissue with no mass or asymmetry in this region. A 90 degree lateral view of the left breast is negative for a mass in the region of concern on the recent screening mammogram. No findings to suggest malignancy. Chronic stable benign circumscribed mass in the outer left breast noted.  Mammographic images were processed with CAD.  IMPRESSION: No evidence of malignancy in the left breast.  RECOMMENDATION: Screening mammogram in one year.(Code:[id number])  I have discussed the findings and recommendations with the patient. If applicable, a reminder letter will be sent to the patient regarding the next appointment.  BI-RADS CATEGORY 1:  Negative.   breast MR bilat 05/05/2019 FINDINGS: Breast composition: b. Scattered fibroglandular tissue.  Background parenchymal enhancement: Mild.  Right breast: No suspicious mass or abnormal enhancement. There are stable post biopsy changes in the lateral right breast at middle depth.  Left breast: Linear non mass enhancement is seen in the superomedial right breast at middle depth (series 11, image 68/144). This measures 5 x 5 mm and extends approximately 2 cm in the AP dimension. This area is not in association with prior post biopsy marking clip.  There are stable post biopsy changes within the central left breast at anterior and middle depth. A circumscribed enhancing mass in the lateral aspect at middle depth is stable from multiple prior studies.  Lymph nodes: No abnormal appearing lymph nodes.  Ancillary findings: None.  IMPRESSION: 1. Indeterminate linear enhancement spanning 2 cm in the medial left breast (series 11, image 68/144). Recommendation is for MRI guided biopsy. 2. No suspicious findings in the right breast. 3. No suspicious lymphadenopathy.  RECOMMENDATION: MRI guided biopsy of the left breast.  BI-RADS CATEGORY 4: Suspicious.   pathology MR breast biopsy 05/22/2019 Breast, left, needle core biopsy, upper inner - ATYPICAL DUCTAL HYPERPLASIA   Past Surgical History Laparoscopic Inguinal Hernia Surgery  Left.  Diagnostic Studies History Colonoscopy  1-5 years ago  Allergies HYDROCODONE   Medication History  Atorvastatin Calcium (10MG  Tablet, Oral) Active. Hydrocortisone (2.5% Cream, External) Active. Vitamin D (Ergocalciferol) (50 MCG(2000 UT) Capsule, Oral) Active. Gabapentin (100MG  Capsule, Oral) Active. Kenalog (0.1% Cream, External) Active. Atarax (25MG  Tablet, Oral) Active. Medications Reconciled  Social History Alcohol use  Occasional alcohol use. Caffeine use  Tea. No drug use  Tobacco use  Never  smoker.  Family History  Breast Cancer  Mother. Cancer  Father. Colon Cancer  Sister. Kidney Disease  Brother.  Pregnancy / Birth History Age at menarche  57 years. Age of menopause  65-55 Gravida  0  Other Problems Anxiety Disorder  Back Pain  Hypercholesterolemia  Inguinal Hernia     Review of Systems General Not Present- Appetite Loss, Chills, Fatigue, Fever, Night Sweats, Weight Gain and Weight Loss. Skin Not Present- Change in Wart/Mole, Dryness, Hives, Jaundice, New Lesions, Non-Healing Wounds, Rash and Ulcer. HEENT Not Present- Earache, Hearing Loss, Hoarseness, Nose Bleed, Oral Ulcers, Ringing in the Ears, Seasonal Allergies, Sinus Pain, Sore Throat, Visual Disturbances, Wears glasses/contact lenses and Yellow Eyes. Respiratory Not Present- Bloody sputum, Chronic Cough, Difficulty Breathing, Snoring and Wheezing. Breast Not Present- Breast Mass, Breast Pain, Nipple Discharge and Skin Changes. Cardiovascular Not Present- Chest Pain, Difficulty Breathing Lying Down, Leg Cramps, Palpitations, Rapid Heart Rate, Shortness of Breath and Swelling of Extremities. Gastrointestinal Not Present- Abdominal Pain, Bloating, Bloody Stool, Change in Bowel Habits, Chronic diarrhea, Constipation, Difficulty Swallowing, Excessive gas, Gets full quickly at meals, Hemorrhoids, Indigestion, Nausea, Rectal Pain and Vomiting. Female Genitourinary Not Present- Frequency, Nocturia, Painful Urination, Pelvic Pain and Urgency. Musculoskeletal Not Present- Back Pain, Joint Pain, Joint Stiffness, Muscle Pain, Muscle Weakness and Swelling of Extremities. Psychiatric Present- Anxiety. Not Present- Bipolar, Change in Sleep Pattern, Depression, Fearful and Frequent crying. Endocrine Not Present- Cold Intolerance, Excessive Hunger, Hair Changes, Heat Intolerance, Hot flashes and New Diabetes. Hematology Not Present- Blood Thinners, Easy Bruising, Excessive bleeding, Gland problems, HIV and  Persistent Infections.  Vitals  Weight: 166.25 lb Height: 66in Body Surface Area: 1.85 m Body Mass Index: 26.83 kg/m  Temp.: 97.56F  Pulse: 101 (Regular)  P.OX: 96% (Room air) BP: 116/78(Sitting, Left Arm, Standard)       Physical Exam  General Mental Status-Alert. General Appearance-Consistent with stated age. Hydration-Well hydrated. Voice-Normal.  Head and Neck Head-normocephalic, atraumatic with no lesions or palpable masses. Trachea-midline. Thyroid Gland Characteristics - normal size and consistency.  Eye Eyeball - Bilateral-Extraocular movements intact. Sclera/Conjunctiva - Bilateral-No scleral icterus.  Chest and Lung Exam Chest and lung exam reveals -quiet, even and easy respiratory effort with no use of accessory muscles and on auscultation, normal breath sounds, no adventitious sounds and normal vocal resonance. Inspection Chest Wall - Normal. Back - normal.  Breast Note: breasts are ptotic bilaterally. no palpable masses. no skin dimpling or nipple retraction, no LAD. relatively symmetric. upper medial left breast has small healing hematoma.   Cardiovascular Cardiovascular examination reveals -normal heart sounds, regular rate and rhythm with no murmurs and normal pedal pulses bilaterally.  Abdomen Inspection Inspection of the abdomen reveals - No Hernias. Palpation/Percussion Palpation and Percussion of the abdomen reveal - Soft, Non Tender, No Rebound tenderness, No Rigidity (guarding) and No hepatosplenomegaly. Auscultation Auscultation of the abdomen reveals - Bowel sounds normal.  Neurologic Neurologic evaluation reveals -alert and oriented x 3 with no impairment of recent or remote memory. Mental Status-Normal.  Musculoskeletal Global Assessment -Note: no gross deformities.  Normal Exam - Left-Upper Extremity Strength Normal and Lower Extremity Strength Normal. Normal Exam - Right-Upper  Extremity Strength Normal and Lower Extremity Strength Normal.  Lymphatic Head & Neck  General Head & Neck Lymphatics: Bilateral - Description - Normal. Axillary  General Axillary Region: Bilateral - Description - Normal. Tenderness - Non Tender. Femoral & Inguinal  Generalized Femoral & Inguinal Lymphatics: Bilateral - Description -  No Generalized lymphadenopathy.    Assessment & Plan  ABNORMAL MAGNETIC RESONANCE IMAGING OF LEFT BREAST (R92.8) Impression: Pt has ADH on core needle biopsy which requires excision to rule out occult cancer.  I discussed seed localized excisional biopsy with the patient. The surgical procedure was described to the patient. I discussed the incision type and location and that we would need radiology involved on with a wire or seed marker and/or sentinel node.  The risks and benefits of the procedure were described to the patient and she wishes to proceed.  We discussed the risks bleeding, infection, damage to other structures, need for further procedures/surgeries. We discussed the risk of seroma. The patient was advised if the area in the breast in cancer, we may need to go back to surgery for additional tissue to obtain negative margins or for a lymph node biopsy. The patient was advised that these are the most common complications, but that others can occur as well. They were advised against taking aspirin or other anti-inflammatory agents/blood thinners the week before surgery.  She would like to do this in march or at the end of april due to family commitments. Current Plans You are being scheduled for surgery- Our schedulers will call you.  You should hear from our office's scheduling department within 5 working days about the location, date, and time of surgery. We try to make accommodations for patient's preferences in scheduling surgery, but sometimes the OR schedule or the surgeon's schedule prevents [place] from making those accommodations.  If you  have not heard from our office [phone]) in 5 working days, call the office and ask for your surgeon's nurse.  If you have other questions about your diagnosis, plan, or surgery, call the office and ask for your surgeon's nurse.  Pt Education - [facility] Breast Biopsy HCI: discussed with patient and provided information.

## 2019-08-19 ENCOUNTER — Ambulatory Visit (HOSPITAL_COMMUNITY): Payer: BC Managed Care – PPO | Admitting: Certified Registered"

## 2019-08-19 ENCOUNTER — Ambulatory Visit (HOSPITAL_COMMUNITY): Payer: BC Managed Care – PPO | Admitting: Vascular Surgery

## 2019-08-19 ENCOUNTER — Encounter (HOSPITAL_COMMUNITY): Payer: Self-pay | Admitting: General Surgery

## 2019-08-19 ENCOUNTER — Ambulatory Visit
Admission: RE | Admit: 2019-08-19 | Discharge: 2019-08-19 | Disposition: A | Discharge status: Home / Self Care | Payer: BC Managed Care – PPO | Source: Ambulatory Visit | Attending: General Surgery | Admitting: General Surgery

## 2019-08-19 ENCOUNTER — Ambulatory Visit (HOSPITAL_COMMUNITY)
Admission: RE | Admit: 2019-08-19 | Discharge: 2019-08-19 | Disposition: A | Discharge status: Home / Self Care | Payer: BC Managed Care – PPO | Attending: General Surgery | Admitting: General Surgery

## 2019-08-19 ENCOUNTER — Other Ambulatory Visit: Payer: Self-pay

## 2019-08-19 ENCOUNTER — Encounter (HOSPITAL_COMMUNITY)
Admission: RE | Disposition: A | Discharge status: Home / Self Care | Payer: Self-pay | Source: Home / Self Care | Attending: General Surgery

## 2019-08-19 DIAGNOSIS — Z79899 Other long term (current) drug therapy: Secondary | ICD-10-CM | POA: Insufficient documentation

## 2019-08-19 DIAGNOSIS — R928 Other abnormal and inconclusive findings on diagnostic imaging of breast: Secondary | ICD-10-CM | POA: Diagnosis present

## 2019-08-19 DIAGNOSIS — E7800 Pure hypercholesterolemia, unspecified: Secondary | ICD-10-CM | POA: Insufficient documentation

## 2019-08-19 DIAGNOSIS — E119 Type 2 diabetes mellitus without complications: Secondary | ICD-10-CM | POA: Insufficient documentation

## 2019-08-19 DIAGNOSIS — N6489 Other specified disorders of breast: Secondary | ICD-10-CM | POA: Insufficient documentation

## 2019-08-19 DIAGNOSIS — F419 Anxiety disorder, unspecified: Secondary | ICD-10-CM | POA: Diagnosis not present

## 2019-08-19 DIAGNOSIS — Z803 Family history of malignant neoplasm of breast: Secondary | ICD-10-CM | POA: Diagnosis not present

## 2019-08-19 HISTORY — PX: RADIOACTIVE SEED GUIDED EXCISIONAL BREAST BIOPSY: SHX6490

## 2019-08-19 LAB — GLUCOSE, CAPILLARY
Glucose-Capillary: 107 mg/dL — ABNORMAL HIGH (ref 70–99)
Glucose-Capillary: 135 mg/dL — ABNORMAL HIGH (ref 70–99)

## 2019-08-19 SURGERY — RADIOACTIVE SEED GUIDED BREAST BIOPSY
Anesthesia: General | Site: Breast | Laterality: Left

## 2019-08-19 MED ORDER — MEPERIDINE HCL 25 MG/ML IJ SOLN: 6.2500 mg | INTRAMUSCULAR | Status: DC | PRN

## 2019-08-19 MED ORDER — ONDANSETRON HCL 4 MG/2ML IJ SOLN: 4.0000 mg | Freq: Once | INTRAMUSCULAR | Status: DC | PRN

## 2019-08-19 MED ORDER — DEXAMETHASONE SODIUM PHOSPHATE 10 MG/ML IJ SOLN
INTRAMUSCULAR | Status: AC
  Filled 2019-08-19: qty 1

## 2019-08-19 MED ORDER — OXYCODONE HCL 5 MG/5ML PO SOLN: 5.0000 mg | Freq: Once | ORAL | Status: DC | PRN

## 2019-08-19 MED ORDER — OXYCODONE HCL 5 MG PO TABS: 5.0000 mg | ORAL_TABLET | Freq: Once | ORAL | Status: DC | PRN

## 2019-08-19 MED ORDER — ENSURE PRE-SURGERY PO LIQD: 296.0000 mL | Freq: Once | ORAL | Status: DC

## 2019-08-19 MED ORDER — 0.9 % SODIUM CHLORIDE (POUR BTL) OPTIME
TOPICAL | Status: DC | PRN
  Administered 2019-08-19: 1000 mL

## 2019-08-19 MED ORDER — CHLORHEXIDINE GLUCONATE CLOTH 2 % EX PADS: 6.0000 | MEDICATED_PAD | Freq: Once | CUTANEOUS | Status: DC

## 2019-08-19 MED ORDER — MIDAZOLAM HCL 5 MG/5ML IJ SOLN
INTRAMUSCULAR | Status: DC | PRN
  Administered 2019-08-19: 2 mg via INTRAVENOUS

## 2019-08-19 MED ORDER — ONDANSETRON HCL 4 MG/2ML IJ SOLN
INTRAMUSCULAR | Status: AC
  Filled 2019-08-19: qty 2

## 2019-08-19 MED ORDER — CEFAZOLIN SODIUM-DEXTROSE 2-4 GM/100ML-% IV SOLN
2.0000 g | INTRAVENOUS | Status: AC
  Administered 2019-08-19: 08:00:00 2 g via INTRAVENOUS
  Filled 2019-08-19: qty 100

## 2019-08-19 MED ORDER — LACTATED RINGERS IV SOLN: INTRAVENOUS | Status: DC | PRN

## 2019-08-19 MED ORDER — MIDAZOLAM HCL 2 MG/2ML IJ SOLN
INTRAMUSCULAR | Status: AC
  Filled 2019-08-19: qty 2

## 2019-08-19 MED ORDER — LIDOCAINE 2% (20 MG/ML) 5 ML SYRINGE
INTRAMUSCULAR | Status: AC
  Filled 2019-08-19: qty 5

## 2019-08-19 MED ORDER — PHENYLEPHRINE 40 MCG/ML (10ML) SYRINGE FOR IV PUSH (FOR BLOOD PRESSURE SUPPORT)
PREFILLED_SYRINGE | INTRAVENOUS | Status: AC
  Filled 2019-08-19: qty 10

## 2019-08-19 MED ORDER — LIDOCAINE 2% (20 MG/ML) 5 ML SYRINGE
INTRAMUSCULAR | Status: DC | PRN
  Administered 2019-08-19: 50 mg via INTRAVENOUS

## 2019-08-19 MED ORDER — LIDOCAINE HCL 1 % IJ SOLN
INTRAMUSCULAR | Status: DC | PRN
  Administered 2019-08-19: 30 mL

## 2019-08-19 MED ORDER — CHLORHEXIDINE GLUCONATE 0.12 % MT SOLN
15.0000 mL | Freq: Once | OROMUCOSAL | Status: AC
  Administered 2019-08-19: 15 mL via OROMUCOSAL
  Filled 2019-08-19: qty 15

## 2019-08-19 MED ORDER — PROPOFOL 10 MG/ML IV BOLUS
INTRAVENOUS | Status: AC
  Filled 2019-08-19: qty 20

## 2019-08-19 MED ORDER — DEXAMETHASONE SODIUM PHOSPHATE 10 MG/ML IJ SOLN
INTRAMUSCULAR | Status: DC | PRN
  Administered 2019-08-19: 5 mg via INTRAVENOUS

## 2019-08-19 MED ORDER — ORAL CARE MOUTH RINSE: 15.0000 mL | Freq: Once | OROMUCOSAL | Status: AC

## 2019-08-19 MED ORDER — FENTANYL CITRATE (PF) 100 MCG/2ML IJ SOLN: 25.0000 ug | INTRAMUSCULAR | Status: DC | PRN

## 2019-08-19 MED ORDER — FENTANYL CITRATE (PF) 250 MCG/5ML IJ SOLN
INTRAMUSCULAR | Status: AC
  Filled 2019-08-19: qty 5

## 2019-08-19 MED ORDER — ONDANSETRON HCL 4 MG/2ML IJ SOLN
INTRAMUSCULAR | Status: DC | PRN
  Administered 2019-08-19: 4 mg via INTRAVENOUS

## 2019-08-19 MED ORDER — ACETAMINOPHEN 500 MG PO TABS
1000.0000 mg | ORAL_TABLET | ORAL | Status: AC
  Administered 2019-08-19: 1000 mg via ORAL
  Filled 2019-08-19: qty 2

## 2019-08-19 MED ORDER — PROPOFOL 10 MG/ML IV BOLUS
INTRAVENOUS | Status: DC | PRN
  Administered 2019-08-19: 150 mg via INTRAVENOUS

## 2019-08-19 MED ORDER — FENTANYL CITRATE (PF) 100 MCG/2ML IJ SOLN
INTRAMUSCULAR | Status: DC | PRN
  Administered 2019-08-19 (×2): 50 ug via INTRAVENOUS

## 2019-08-19 MED ORDER — LIDOCAINE-EPINEPHRINE 1 %-1:100000 IJ SOLN
INTRAMUSCULAR | Status: AC
  Filled 2019-08-19: qty 1

## 2019-08-19 MED ORDER — KETOROLAC TROMETHAMINE 30 MG/ML IJ SOLN
INTRAMUSCULAR | Status: DC | PRN
  Administered 2019-08-19: 30 mg via INTRAVENOUS

## 2019-08-19 MED ORDER — PHENYLEPHRINE 40 MCG/ML (10ML) SYRINGE FOR IV PUSH (FOR BLOOD PRESSURE SUPPORT)
PREFILLED_SYRINGE | INTRAVENOUS | Status: DC | PRN
  Administered 2019-08-19 (×5): 80 ug via INTRAVENOUS

## 2019-08-19 MED ORDER — BUPIVACAINE HCL (PF) 0.25 % IJ SOLN
INTRAMUSCULAR | Status: AC
  Filled 2019-08-19: qty 30

## 2019-08-19 MED ORDER — OXYCODONE HCL 5 MG PO TABS: 5.0000 mg | ORAL_TABLET | Freq: Four times a day (QID) | ORAL | 0 refills | Status: DC | PRN

## 2019-08-19 SURGICAL SUPPLY — 45 items
ADH SKN CLS APL DERMABOND .7 (GAUZE/BANDAGES/DRESSINGS) ×1
APL PRP STRL LF DISP 70% ISPRP (MISCELLANEOUS) ×1
BINDER BREAST LRG (GAUZE/BANDAGES/DRESSINGS) IMPLANT
BINDER BREAST XLRG (GAUZE/BANDAGES/DRESSINGS) ×2 IMPLANT
BLADE SURG 10 STRL SS (BLADE) ×3 IMPLANT
CANISTER SUCT 3000ML PPV (MISCELLANEOUS) ×3 IMPLANT
CHLORAPREP W/TINT 26 (MISCELLANEOUS) ×3 IMPLANT
CLIP VESOCCLUDE LG 6/CT (CLIP) ×2 IMPLANT
CLOSURE WOUND 1/2 X4 (GAUZE/BANDAGES/DRESSINGS) ×1
COVER PROBE W GEL 5X96 (DRAPES) ×3 IMPLANT
COVER SURGICAL LIGHT HANDLE (MISCELLANEOUS) ×3 IMPLANT
COVER WAND RF STERILE (DRAPES) ×3 IMPLANT
DERMABOND ADVANCED (GAUZE/BANDAGES/DRESSINGS) ×2
DERMABOND ADVANCED .7 DNX12 (GAUZE/BANDAGES/DRESSINGS) ×1 IMPLANT
DEVICE DUBIN SPECIMEN MAMMOGRA (MISCELLANEOUS) ×3 IMPLANT
DRAPE CHEST BREAST 15X10 FENES (DRAPES) ×3 IMPLANT
DRSG PAD ABDOMINAL 8X10 ST (GAUZE/BANDAGES/DRESSINGS) ×3 IMPLANT
ELECT CAUTERY BLADE 6.4 (BLADE) ×3 IMPLANT
ELECT REM PT RETURN 9FT ADLT (ELECTROSURGICAL) ×3
ELECTRODE REM PT RTRN 9FT ADLT (ELECTROSURGICAL) ×1 IMPLANT
GAUZE SPONGE 4X4 12PLY STRL (GAUZE/BANDAGES/DRESSINGS) ×2 IMPLANT
GLOVE BIO SURGEON STRL SZ 6 (GLOVE) ×3 IMPLANT
GLOVE BIOGEL PI IND STRL 6.5 (GLOVE) IMPLANT
GLOVE BIOGEL PI INDICATOR 6.5 (GLOVE) ×4
GLOVE INDICATOR 6.5 STRL GRN (GLOVE) ×3 IMPLANT
GLOVE SURG SS PI 6.5 STRL IVOR (GLOVE) ×4 IMPLANT
GOWN STRL REUS W/ TWL LRG LVL3 (GOWN DISPOSABLE) ×1 IMPLANT
GOWN STRL REUS W/TWL 2XL LVL3 (GOWN DISPOSABLE) ×3 IMPLANT
GOWN STRL REUS W/TWL LRG LVL3 (GOWN DISPOSABLE) ×6
KIT BASIN OR (CUSTOM PROCEDURE TRAY) ×3 IMPLANT
KIT MARKER MARGIN INK (KITS) ×3 IMPLANT
LIGHT WAVEGUIDE WIDE FLAT (MISCELLANEOUS) ×2 IMPLANT
NDL HYPO 25GX1X1/2 BEV (NEEDLE) ×1 IMPLANT
NEEDLE HYPO 25GX1X1/2 BEV (NEEDLE) ×3 IMPLANT
NS IRRIG 1000ML POUR BTL (IV SOLUTION) ×3 IMPLANT
PACK GENERAL/GYN (CUSTOM PROCEDURE TRAY) ×3 IMPLANT
PENCIL BUTTON HOLSTER BLD 10FT (ELECTRODE) ×2 IMPLANT
STRIP CLOSURE SKIN 1/2X4 (GAUZE/BANDAGES/DRESSINGS) ×2 IMPLANT
SUT MNCRL AB 4-0 PS2 18 (SUTURE) ×3 IMPLANT
SUT VIC AB 2-0 SH 27 (SUTURE) ×3
SUT VIC AB 2-0 SH 27XBRD (SUTURE) ×1 IMPLANT
SUT VIC AB 3-0 SH 27 (SUTURE) ×3
SUT VIC AB 3-0 SH 27X BRD (SUTURE) ×1 IMPLANT
SYR CONTROL 10ML LL (SYRINGE) ×3 IMPLANT
TOWEL GREEN STERILE FF (TOWEL DISPOSABLE) ×3 IMPLANT

## 2019-08-19 NOTE — Interval H&P Note (Signed)
History and Physical Interval Note:  08/19/2019 8:01 AM  Brenda [name]  has presented today for surgery, with the diagnosis of ABNORMAL LEFT BREAST MRI.  The various methods of treatment have been discussed with the patient and family. After consideration of risks, benefits and other options for treatment, the patient has consented to  Procedure(s): LEFT BREAST RADIOACTIVE SEED GUIDED EXCISIONAL BREAST BIOPSY (Left) as a surgical intervention.  The patient's history has been reviewed, patient examined, no change in status, stable for surgery.  I have reviewed the patient's chart and labs.  Questions were answered to the patient's satisfaction.     Stark Klein

## 2019-08-19 NOTE — Anesthesia Procedure Notes (Signed)
Procedure Name: LMA Insertion Date/Time: 08/19/2019 8:17 AM Performed by: Gwyndolyn Saxon, CRNA Pre-anesthesia Checklist: Patient identified, Emergency Drugs available, Suction available, Patient being monitored and Timeout performed Patient Re-evaluated:Patient Re-evaluated prior to induction Oxygen Delivery Method: Circle system utilized Preoxygenation: Pre-oxygenation with 100% oxygen Induction Type: IV induction LMA: LMA inserted LMA Size: 4.0 Number of attempts: 1 Tube secured with: Tape

## 2019-08-19 NOTE — Discharge Instructions (Addendum)
[facility] °Office Phone Number [phone] ° °BREAST BIOPSY/ PARTIAL MASTECTOMY: POST OP INSTRUCTIONS ° °Always review your discharge instruction sheet given to you by the facility where your surgery was performed. ° °IF YOU HAVE DISABILITY OR FAMILY LEAVE FORMS, YOU MUST BRING THEM TO THE OFFICE FOR PROCESSING.  DO NOT GIVE THEM TO YOUR DOCTOR. ° °1. A prescription for pain medication may be given to you upon discharge.  Take your pain medication as prescribed, if needed.  If narcotic pain medicine is not needed, then you may take acetaminophen (Tylenol) or ibuprofen (Advil) as needed. °2. Take your usually prescribed medications unless otherwise directed °3. If you need a refill on your pain medication, please contact your pharmacy.  They will contact our office to request authorization.  Prescriptions will not be filled after 5pm or on week-ends. °4. You should eat very light the first 24 hours after surgery, such as soup, crackers, pudding, etc.  Resume your normal diet the day after surgery. °5. Most patients will experience some swelling and bruising in the breast.  Ice packs and a good support bra will help.  Swelling and bruising can take several days to resolve.  °6. It is common to experience some constipation if taking pain medication after surgery.  Increasing fluid intake and taking a stool softener will usually help or prevent this problem from occurring.  A mild laxative (Milk of Magnesia or Miralax) should be taken according to package directions if there are no bowel movements after 48 hours. °7. Unless discharge instructions indicate otherwise, you may remove your bandages 48 hours after surgery, and you may shower at that time.  You may have steri-strips (small skin tapes) in place directly over the incision.  These strips should be left on the skin for 7-10 days.   Any sutures or staples will be removed at the office during your follow-up visit. °8. ACTIVITIES:  You may resume  regular daily activities (gradually increasing) beginning the next day.  Wearing a good support bra or sports bra (or the breast binder) minimizes pain and swelling.  You may have sexual intercourse when it is comfortable. °a. You may drive when you no longer are taking prescription pain medication, you can comfortably wear a seatbelt, and you can safely maneuver your car and apply brakes. °b. RETURN TO WORK:  __________1 week_______________ °9. You should see your doctor in the office for a follow-up appointment approximately two weeks after your surgery.  Your doctor’s nurse will typically make your follow-up appointment when she calls you with your pathology report.  Expect your pathology report 2-3 business days after your surgery.  You may call to check if you do not hear from us after three days. ° ° °WHEN TO CALL YOUR DOCTOR: °1. Fever over 101.0 °2. Nausea and/or vomiting. °3. Extreme swelling or bruising. °4. Continued bleeding from incision. °5. Increased pain, redness, or drainage from the incision. ° °The clinic staff is available to answer your questions during regular business hours.  Please don’t hesitate to call and ask to speak to one of the nurses for clinical concerns.  If you have a medical emergency, go to the nearest emergency room or call 911.  A surgeon from [facility] is always on call at the hospital. ° °For further questions, please visit [email or web address]  ° °

## 2019-08-19 NOTE — Op Note (Signed)
Left Breast Radioactive seed localized excisional biopsy  Indications: This patient presents with history of abnormal left mammogram with discordant core needle biopsy.    Pre-operative Diagnosis: abnormal left mammogram    Post-operative Diagnosis: abnormal left mammogram  Surgeon: Stark Klein   Anesthesia: General endotracheal anesthesia  ASA Class: 2  Procedure Details  The patient was seen in the Holding Room. The risks, benefits, complications, treatment options, and expected outcomes were discussed with the patient. The possibilities of bleeding, infection, the need for additional procedures, failure to diagnose a condition, and creating a complication requiring transfusion or operation were discussed with the patient. The patient concurred with the proposed plan, giving informed consent.  The site of surgery properly noted/marked. The patient was taken to Operating Room # 2, identified, and the procedure verified as left Breast seed localized excisional biopsy. A Time Out was held and the above information confirmed.  The left breast and chest were prepped and draped in standard fashion. A superior circumareolar incision was made near the previously placed radioactive seed.  Dissection was carried down around the point of maximum signal intensity. The cautery was used to perform the dissection.   The specimen was inked with the margin marker paint kit.    Specimen radiography confirmed inclusion of the mammographic lesion, the clip, and the seed.  The background signal in the breast was zero.  Five clips were placed in the breast cavity.  Hemostasis was achieved with cautery.  The wound was irrigated and closed with 3-0 vicryl interrupted deep dermal sutures and 4-0 monocryl running subcuticular suture.      Sterile dressings were applied. At the end of the operation, all sponge, instrument, and needle counts were correct.  Findings: Seed, clip in specimen  Estimated Blood Loss:   min         Specimens: left breast tissue with seed         Complications:  None; patient tolerated the procedure well.         Disposition: PACU - hemodynamically stable.         Condition: stable

## 2019-08-19 NOTE — Transfer of Care (Signed)
Immediate Anesthesia Transfer of Care Note  Patient: Brenda [name]  Procedure(s) Performed: LEFT BREAST RADIOACTIVE SEED GUIDED EXCISIONAL BREAST BIOPSY (Left Breast)  Patient Location: PACU  Anesthesia Type:General  Level of Consciousness: drowsy and patient cooperative  Airway & Oxygen Therapy: Patient Spontanous Breathing and Patient connected to face mask oxygen  Post-op Assessment: Report given to RN and Post -op Vital signs reviewed and stable  Post vital signs: Reviewed and stable  Last Vitals:  Vitals Value Taken Time  BP 111/72 08/19/19 0941  Temp    Pulse 83 08/19/19 0944  Resp 14 08/19/19 0944  SpO2 100 % 08/19/19 0944  Vitals shown include unvalidated device data.  Last Pain:  Vitals:   08/19/19 0621  TempSrc:   PainSc: 0-No pain         Complications: No apparent anesthesia complications

## 2019-08-20 ENCOUNTER — Encounter: Payer: Self-pay | Admitting: *Deleted

## 2019-08-21 LAB — SURGICAL PATHOLOGY

## 2019-08-21 NOTE — Anesthesia Postprocedure Evaluation (Signed)
Anesthesia Post Note  Patient: Brenda [name]  Procedure(s) Performed: LEFT BREAST RADIOACTIVE SEED GUIDED EXCISIONAL BREAST BIOPSY (Left Breast)     Patient location during evaluation: PACU Anesthesia Type: General Level of consciousness: sedated and patient cooperative Pain management: pain level controlled Vital Signs Assessment: post-procedure vital signs reviewed and stable Respiratory status: spontaneous breathing Cardiovascular status: stable Anesthetic complications: no    Last Vitals:  Vitals:   08/19/19 1009 08/19/19 1011  BP:  108/64  Pulse: 71 71  Resp: 12 12  Temp:  36.4 C  SpO2: 95% 96%    Last Pain:  Vitals:   08/19/19 0941  TempSrc:   PainSc: [place]

## 2019-11-20 ENCOUNTER — Other Ambulatory Visit: Payer: Self-pay | Admitting: General Surgery

## 2019-11-20 DIAGNOSIS — R928 Other abnormal and inconclusive findings on diagnostic imaging of breast: Secondary | ICD-10-CM

## 2019-12-19 ENCOUNTER — Other Ambulatory Visit: Payer: Self-pay

## 2019-12-19 ENCOUNTER — Other Ambulatory Visit: Payer: Self-pay | Admitting: General Surgery

## 2019-12-19 ENCOUNTER — Ambulatory Visit
Admission: RE | Admit: 2019-12-19 | Discharge: 2019-12-19 | Disposition: A | Discharge status: Home / Self Care | Payer: BC Managed Care – PPO | Source: Ambulatory Visit | Attending: General Surgery | Admitting: General Surgery

## 2019-12-19 DIAGNOSIS — R928 Other abnormal and inconclusive findings on diagnostic imaging of breast: Secondary | ICD-10-CM

## 2020-01-16 ENCOUNTER — Ambulatory Visit: Payer: BC Managed Care – PPO | Attending: Family

## 2020-01-16 DIAGNOSIS — Z23 Encounter for immunization: Secondary | ICD-10-CM

## 2020-02-09 ENCOUNTER — Encounter: Payer: Self-pay | Admitting: Family

## 2020-02-09 ENCOUNTER — Ambulatory Visit (INDEPENDENT_AMBULATORY_CARE_PROVIDER_SITE_OTHER): Payer: BC Managed Care – PPO | Admitting: Family

## 2020-02-09 ENCOUNTER — Other Ambulatory Visit: Payer: Self-pay

## 2020-02-09 VITALS — BP 122/68 | HR 91 | Temp 97.8°F | Ht 66.0 in | Wt 167.6 lb

## 2020-02-09 DIAGNOSIS — Z0000 Encounter for general adult medical examination without abnormal findings: Secondary | ICD-10-CM | POA: Diagnosis not present

## 2020-02-09 DIAGNOSIS — E785 Hyperlipidemia, unspecified: Secondary | ICD-10-CM | POA: Diagnosis not present

## 2020-02-09 DIAGNOSIS — E559 Vitamin D deficiency, unspecified: Secondary | ICD-10-CM | POA: Diagnosis not present

## 2020-02-09 DIAGNOSIS — Z23 Encounter for immunization: Secondary | ICD-10-CM

## 2020-02-09 MED ORDER — ATORVASTATIN CALCIUM 10 MG PO TABS: 10.0000 mg | ORAL_TABLET | Freq: Every day | ORAL | 3 refills | Status: DC

## 2020-02-09 MED ORDER — HYDROXYZINE HCL 25 MG PO TABS: 25.0000 mg | ORAL_TABLET | Freq: Three times a day (TID) | ORAL | 3 refills | Status: DC | PRN

## 2020-02-09 NOTE — Progress Notes (Signed)
Brenda [name] is a 69 y.o. female with the following history as recorded in EpicCare:  Patient Active Problem List   Diagnosis Date Noted  . Patellar subluxation, right, initial encounter 09/23/2018  . Degenerative arthritis of knee, bilateral 03/20/2018  . Pre-diabetes 01/17/2018  . Hyperglycemia 12/24/2017  . Cough 01/22/2017  . Arthralgia 11/17/2015  . Left lumbar radiculopathy 10/25/2015  . Low back pain 08/25/2015  . Menopausal state 08/22/2011  . Routine general medical examination at a health care facility 09/28/2010  . Hyperlipidemia 04/25/2010  . HEARING LOSS 09/15/2009  . Disorder of bone and cartilage 04/20/2007    Current Outpatient Medications  Medication Sig Dispense Refill  . acetaminophen (TYLENOL) 500 MG tablet Take 500 mg by mouth every 6 (six) hours as needed (for pain.).    Marland Kitchen atorvastatin (LIPITOR) 10 MG tablet Take 1 tablet (10 mg total) by mouth daily. 90 tablet 3  . hydrOXYzine (ATARAX/VISTARIL) 25 MG tablet Take 1 tablet (25 mg total) by mouth every 8 (eight) hours as needed (anxiety/itching.). 90 tablet 3  . metroNIDAZOLE (METROCREAM) 0.75 % cream Apply 1 application topically in the morning and at bedtime.   2  . Vitamin D, Ergocalciferol, (DRISDOL) 1.25 MG (50000 UNIT) CAPS capsule Take 1 capsule (50,000 Units total) by mouth every 7 (seven) days. (Patient taking differently: Take 50,000 Units by mouth every Sunday. ) 12 capsule 0  . gabapentin (NEURONTIN) 100 MG capsule Take 2 capsules (200 mg total) by mouth at bedtime. (Patient not taking: Reported on 02/09/2020) 60 capsule 3   No current facility-administered medications for this visit.    Allergies: Hydrocodone  Past Medical History:  Diagnosis Date  . ABDOMINAL/PELVIC SWELLING MASS/LUMP UNSPEC SITE 09/18/2007  . ACUTE URIS OF UNSPECIFIED SITE 06/06/2007  . Allergy   . Anemia   . Anxiety    no meds  . Arthritis of hip    08/13/2019: per patient "both hips"  . Arthritis of knee, left   . Arthritis of  right knee   . ASTHMATIC BRONCHITIS, ACUTE 05/11/2008  . DM 09/18/2007   borderline -diet controlled- no meds  . GERD (gastroesophageal reflux disease)   . Glucose intolerance (impaired glucose tolerance)   . HEARING LOSS 09/15/2009    mild -no hearing aids  . HYPERLIPIDEMIA 04/25/2010  . OSTEOPENIA 04/20/2007  . Palpitations 03/13/2008  . Pneumonia   . THYROID NODULE, RIGHT 05/11/2008    Past Surgical History:  Procedure Laterality Date  . BREAST BIOPSY Left 05/22/2019  . DILATION AND CURETTAGE, DIAGNOSTIC / THERAPEUTIC    . HAMMER TOE SURGERY     RIGHT  . HERNIA REPAIR  1992  . KNEE SURGERY  1991   Left  . Left Knee cyst  1985  . Lymph Node Removal    . RADIOACTIVE SEED GUIDED EXCISIONAL BREAST BIOPSY Left 08/19/2019   Procedure: LEFT BREAST RADIOACTIVE SEED GUIDED EXCISIONAL BREAST BIOPSY;  Surgeon: Stark Klein, MD;  Location: [place] [facility];  Service: General;  Laterality: Left;    Family History  Problem Relation Age of Onset  . Cancer Mother        Breast Cancer  . Breast cancer Mother   . Cancer Father        "Stomach" Cancer  . Stomach cancer Father   . Cancer Sister        Colon Cancer  . Breast cancer Sister   . Kidney disease Brother        Kidney Transplant  . Colon cancer Neg  Hx   . Esophageal cancer Neg Hx   . Rectal cancer Neg Hx     Social History   Tobacco Use  . Smoking status: Never Smoker  . Smokeless tobacco: Never Used  Substance Use Topics  . Alcohol use: Yes    Alcohol/week: 14.0 standard drinks    Types: 14 Glasses of wine per week    Subjective:   Patient presents for yearly CPE; sees dentist and eye doctor regularly; Dr. Matthew Saras- GYN in December;  Sees PCP at the [facility] yearly for CPE; will be getting labs done with the [facility] in December 2021;  Had lumpectomy in May 2021- not cancerous;  Sees dermatology yearly;  Retiring for [employer] in December 2021;   Review of Systems  Constitutional: Negative.   HENT: Negative.   Eyes: Negative.   Respiratory:  Negative.   Cardiovascular: Negative.   Gastrointestinal: Negative.   Genitourinary: Negative.   Musculoskeletal: Positive for back pain.       Chronic back pain  Skin: Negative.   Neurological: Negative.   Endo/Heme/Allergies: Negative.   Psychiatric/Behavioral: Negative.     Objective:  Vitals:   02/09/20 0958  BP: 122/68  Pulse: 91  Temp: 97.8 F (36.6 C)  TempSrc: Oral  SpO2: 98%  Weight: 167 lb 9.6 oz (76 kg)  Height: 5\' 6"  (1.676 m)    General: Well developed, well nourished, in no acute distress  Skin : Warm and dry.  Head: Normocephalic and atraumatic  Eyes: Sclera and conjunctiva clear; pupils round and reactive to light; extraocular movements intact  Ears: External normal; canals clear; tympanic membranes normal  Oropharynx: Pink, supple. No suspicious lesions  Neck: Supple without thyromegaly, adenopathy  Lungs: Respirations unlabored; clear to auscultation bilaterally without wheeze, rales, rhonchi  CVS exam: normal rate and regular rhythm.  Abdomen: Soft; nontender; nondistended; normoactive bowel sounds; no masses or hepatosplenomegaly  Musculoskeletal: No deformities; no active joint inflammation  Extremities: No edema, cyanosis, clubbing  Vessels: Symmetric bilaterally  Neurologic: Alert and oriented; speech intact; face symmetrical; moves all extremities well; CNII-XII intact without focal deficit   Assessment:  1. PE (physical exam), annual   2. Hyperlipidemia, unspecified hyperlipidemia type   3. Vitamin D deficiency   4. Needs flu shot     Plan:  Age appropriate preventive healthcare needs addressed; encouraged regular eye doctor and dental exams; encouraged regular exercise; will update labs and refills as needed today; follow-up to be determined; She is planning to have her yearly labs done with her PCP at the [place] when she goes there in December; She will continue with her other specialists as scheduled; Flu shot given; Follow-up in 1 year,  sooner prn.   This visit occurred during the SARS-CoV-2 public health emergency.  Safety protocols were in place, including screening questions prior to the visit, additional usage of staff PPE, and extensive cleaning of exam room while observing appropriate contact time as indicated for disinfecting solutions.     No follow-ups on file.  Orders Placed This Encounter  Procedures  . Flu Vaccine QUAD High Dose(Fluad)    Requested Prescriptions   Signed Prescriptions Disp Refills  . atorvastatin (LIPITOR) 10 MG tablet 90 tablet 3    Sig: Take 1 tablet (10 mg total) by mouth daily.  . hydrOXYzine (ATARAX/VISTARIL) 25 MG tablet 90 tablet 3    Sig: Take 1 tablet (25 mg total) by mouth every 8 (eight) hours as needed (anxiety/itching.).

## 2020-02-22 NOTE — Progress Notes (Signed)
° °  Covid-19 Vaccination Clinic  Name:  CHELSI WARR    MRN: [id number] DOB: 12/05/1950  02/22/2020  Ms. Leatherwood was observed post Covid-19 immunization for 15 minutes without incident. She was provided with Vaccine Information Sheet and instruction to access the V-Safe system.   Ms. Thal was instructed to call 911 with any severe reactions post vaccine:  Difficulty breathing   Swelling of face and throat   A fast heartbeat   A bad rash all over body   Dizziness and weakness   Immunizations Administered    Name Date Dose VIS Date [place] COVID-19 Vaccine 01/16/2020 -- -- --   [place] COVID-19 Vaccine 01/16/2020  3:00 PM 0.3 mL 01/28/2020 Intramuscular   Manufacturer: [place]   Lot: [place]: [id number]

## 2020-04-20 DIAGNOSIS — H52223 Regular astigmatism, bilateral: Secondary | ICD-10-CM | POA: Diagnosis not present

## 2020-05-06 ENCOUNTER — Other Ambulatory Visit: Payer: Self-pay | Admitting: General Surgery

## 2020-05-06 DIAGNOSIS — R928 Other abnormal and inconclusive findings on diagnostic imaging of breast: Secondary | ICD-10-CM

## 2020-06-08 HISTORY — PX: BREAST BIOPSY: SHX20

## 2020-06-14 ENCOUNTER — Ambulatory Visit
Admission: RE | Admit: 2020-06-14 | Discharge: 2020-06-14 | Disposition: A | Discharge status: Home / Self Care | Payer: Medicare HMO | Source: Ambulatory Visit | Attending: General Surgery | Admitting: General Surgery

## 2020-06-14 DIAGNOSIS — R928 Other abnormal and inconclusive findings on diagnostic imaging of breast: Secondary | ICD-10-CM

## 2020-06-14 DIAGNOSIS — N6322 Unspecified lump in the left breast, upper inner quadrant: Secondary | ICD-10-CM | POA: Diagnosis not present

## 2020-06-14 MED ORDER — GADOBUTROL 1 MMOL/ML IV SOLN
7.0000 mL | Freq: Once | INTRAVENOUS | Status: AC | PRN
  Administered 2020-06-14: 7 mL via INTRAVENOUS

## 2020-06-16 ENCOUNTER — Other Ambulatory Visit: Payer: Self-pay | Admitting: General Surgery

## 2020-06-16 DIAGNOSIS — R9389 Abnormal findings on diagnostic imaging of other specified body structures: Secondary | ICD-10-CM

## 2020-06-21 ENCOUNTER — Ambulatory Visit
Admission: RE | Admit: 2020-06-21 | Discharge: 2020-06-21 | Disposition: A | Discharge status: Home / Self Care | Payer: Medicare HMO | Source: Ambulatory Visit | Attending: General Surgery | Admitting: General Surgery

## 2020-06-21 ENCOUNTER — Other Ambulatory Visit: Payer: Self-pay

## 2020-06-21 DIAGNOSIS — R9389 Abnormal findings on diagnostic imaging of other specified body structures: Secondary | ICD-10-CM

## 2020-06-21 DIAGNOSIS — R928 Other abnormal and inconclusive findings on diagnostic imaging of breast: Secondary | ICD-10-CM | POA: Diagnosis not present

## 2020-06-21 DIAGNOSIS — N641 Fat necrosis of breast: Secondary | ICD-10-CM | POA: Diagnosis not present

## 2020-06-21 MED ORDER — GADOBUTROL 1 MMOL/ML IV SOLN
7.0000 mL | Freq: Once | INTRAVENOUS | Status: AC | PRN
  Administered 2020-06-21: 7 mL via INTRAVENOUS

## 2020-07-19 ENCOUNTER — Telehealth (INDEPENDENT_AMBULATORY_CARE_PROVIDER_SITE_OTHER): Payer: Medicare HMO | Admitting: Family

## 2020-07-19 DIAGNOSIS — J019 Acute sinusitis, unspecified: Secondary | ICD-10-CM | POA: Diagnosis not present

## 2020-07-19 MED ORDER — PROMETHAZINE-DM 6.25-15 MG/5ML PO SYRP: 5.0000 mL | ORAL_SOLUTION | Freq: Four times a day (QID) | ORAL | 0 refills | Status: DC | PRN

## 2020-07-19 MED ORDER — AMOXICILLIN-POT CLAVULANATE 875-125 MG PO TABS: 1.0000 | ORAL_TABLET | Freq: Two times a day (BID) | ORAL | 0 refills | Status: AC

## 2020-07-19 NOTE — Progress Notes (Signed)
Brenda [name] is a 70 y.o. female with the following history as recorded in EpicCare:  Patient Active Problem List   Diagnosis Date Noted  . Patellar subluxation, right, initial encounter 09/23/2018  . Degenerative arthritis of knee, bilateral 03/20/2018  . Pre-diabetes 01/17/2018  . Hyperglycemia 12/24/2017  . Cough 01/22/2017  . Arthralgia 11/17/2015  . Left lumbar radiculopathy 10/25/2015  . Low back pain 08/25/2015  . Menopausal state 08/22/2011  . Routine general medical examination at a health care facility 09/28/2010  . Hyperlipidemia 04/25/2010  . HEARING LOSS 09/15/2009  . Disorder of bone and cartilage 04/20/2007    Current Outpatient Medications  Medication Sig Dispense Refill  . amoxicillin-clavulanate (AUGMENTIN) 875-125 MG tablet Take 1 tablet by mouth 2 (two) times daily for 10 days. 20 tablet 0  . acetaminophen (TYLENOL) 500 MG tablet Take 500 mg by mouth every 6 (six) hours as needed (for pain.).    Marland Kitchen atorvastatin (LIPITOR) 10 MG tablet Take 1 tablet (10 mg total) by mouth daily. 90 tablet 3  . gabapentin (NEURONTIN) 100 MG capsule Take 2 capsules (200 mg total) by mouth at bedtime. (Patient not taking: Reported on 02/09/2020) 60 capsule 3  . hydrOXYzine (ATARAX/VISTARIL) 25 MG tablet Take 1 tablet (25 mg total) by mouth every 8 (eight) hours as needed (anxiety/itching.). 90 tablet 3  . metroNIDAZOLE (METROCREAM) 0.75 % cream Apply 1 application topically in the morning and at bedtime.   2  . promethazine-dextromethorphan (PROMETHAZINE-DM) 6.25-15 MG/5ML syrup Take 5 mLs by mouth 4 (four) times daily as needed for cough. 180 mL 0  . Vitamin D, Ergocalciferol, (DRISDOL) 1.25 MG (50000 UNIT) CAPS capsule Take 1 capsule (50,000 Units total) by mouth every 7 (seven) days. (Patient taking differently: Take 50,000 Units by mouth every Sunday. ) 12 capsule 0   No current facility-administered medications for this visit.    Allergies: Hydrocodone  Past Medical History:   Diagnosis Date  . ABDOMINAL/PELVIC SWELLING MASS/LUMP UNSPEC SITE 09/18/2007  . ACUTE URIS OF UNSPECIFIED SITE 06/06/2007  . Allergy   . Anemia   . Anxiety    no meds  . Arthritis of hip    08/13/2019: per patient "both hips"  . Arthritis of knee, left   . Arthritis of right knee   . ASTHMATIC BRONCHITIS, ACUTE 05/11/2008  . DM 09/18/2007   borderline -diet controlled- no meds  . GERD (gastroesophageal reflux disease)   . Glucose intolerance (impaired glucose tolerance)   . HEARING LOSS 09/15/2009    mild -no hearing aids  . HYPERLIPIDEMIA 04/25/2010  . OSTEOPENIA 04/20/2007  . Palpitations 03/13/2008  . Pneumonia   . THYROID NODULE, RIGHT 05/11/2008    Past Surgical History:  Procedure Laterality Date  . BREAST BIOPSY Left 05/22/2019  . DILATION AND CURETTAGE, DIAGNOSTIC / THERAPEUTIC    . HAMMER TOE SURGERY     RIGHT  . HERNIA REPAIR  1992  . KNEE SURGERY  1991   Left  . Left Knee cyst  1985  . Lymph Node Removal    . RADIOACTIVE SEED GUIDED EXCISIONAL BREAST BIOPSY Left 08/19/2019   Procedure: LEFT BREAST RADIOACTIVE SEED GUIDED EXCISIONAL BREAST BIOPSY;  Surgeon: Stark Klein, MD;  Location: [place];  Service: General;  Laterality: Left;    Family History  Problem Relation Age of Onset  . Cancer Mother        Breast Cancer  . Breast cancer Mother   . Cancer Father        "Stomach"  Cancer  . Stomach cancer Father   . Cancer Sister        Colon Cancer  . Breast cancer Sister   . Kidney disease Brother        Kidney Transplant  . Colon cancer Neg Hx   . Esophageal cancer Neg Hx   . Rectal cancer Neg Hx     Social History   Tobacco Use  . Smoking status: Never Smoker  . Smokeless tobacco: Never Used  Substance Use Topics  . Alcohol use: Yes    Alcohol/week: 14.0 standard drinks    Types: 14 Glasses of wine per week    Subjective:   I connected with [name] on 07/19/20 at  9:20 AM EDT by a video enabled telemedicine application and verified that I am speaking  with the correct person using two identifiers.   I discussed the limitations of evaluation and management by telemedicine and the availability of in person appointments. The patient expressed understanding and agreed to proceed. Provider in office/ patient is at home; provider and patient are only 2 people on video call.   Concerned for possible sinus infection; feels like congestion moving into chest; has taken 2 home COVID tests in the past 5 days ( most recent was yesterday) and both negative; requesting cough syrup given by her previous PCP with promethazine;    Objective:  There were no vitals filed for this visit.  General: Well developed, well nourished, in no acute distress  Head: Normocephalic and atraumatic  Lungs: Respirations unlabored;  Neurologic: Alert and oriented; speech intact; face symmetrical;   Assessment:  1. Acute sinusitis, recurrence not specified, unspecified location     Plan:  Rx for Augmentin 875 mg bid x 10 days, Rx for Promethazine DM cough syrup; increase fluids, rest and follow-up worse, no better.  No follow-ups on file.  No orders of the defined types were placed in this encounter.   Requested Prescriptions   Signed Prescriptions Disp Refills  . promethazine-dextromethorphan (PROMETHAZINE-DM) 6.25-15 MG/5ML syrup 180 mL 0    Sig: Take 5 mLs by mouth 4 (four) times daily as needed for cough.  Marland Kitchen amoxicillin-clavulanate (AUGMENTIN) 875-125 MG tablet 20 tablet 0    Sig: Take 1 tablet by mouth 2 (two) times daily for 10 days.

## 2020-08-13 DIAGNOSIS — L239 Allergic contact dermatitis, unspecified cause: Secondary | ICD-10-CM | POA: Diagnosis not present

## 2020-08-14 DIAGNOSIS — L299 Pruritus, unspecified: Secondary | ICD-10-CM | POA: Diagnosis not present

## 2020-08-14 DIAGNOSIS — E785 Hyperlipidemia, unspecified: Secondary | ICD-10-CM | POA: Diagnosis not present

## 2020-08-14 DIAGNOSIS — R030 Elevated blood-pressure reading, without diagnosis of hypertension: Secondary | ICD-10-CM | POA: Diagnosis not present

## 2020-10-16 ENCOUNTER — Encounter (HOSPITAL_COMMUNITY): Payer: Self-pay

## 2020-11-19 ENCOUNTER — Ambulatory Visit (INDEPENDENT_AMBULATORY_CARE_PROVIDER_SITE_OTHER): Payer: Medicare HMO | Admitting: Family

## 2020-11-19 ENCOUNTER — Ambulatory Visit (INDEPENDENT_AMBULATORY_CARE_PROVIDER_SITE_OTHER)
Admission: RE | Admit: 2020-11-19 | Discharge: 2020-11-19 | Disposition: A | Discharge status: Home / Self Care | Payer: Medicare HMO | Source: Ambulatory Visit | Attending: Family | Admitting: Family

## 2020-11-19 ENCOUNTER — Other Ambulatory Visit (INDEPENDENT_AMBULATORY_CARE_PROVIDER_SITE_OTHER): Payer: Medicare HMO

## 2020-11-19 ENCOUNTER — Other Ambulatory Visit: Payer: Self-pay

## 2020-11-19 VITALS — BP 110/60 | HR 85 | Temp 97.6°F | Ht 66.0 in | Wt 167.4 lb

## 2020-11-19 DIAGNOSIS — M7989 Other specified soft tissue disorders: Secondary | ICD-10-CM | POA: Diagnosis not present

## 2020-11-19 DIAGNOSIS — M7731 Calcaneal spur, right foot: Secondary | ICD-10-CM | POA: Diagnosis not present

## 2020-11-19 DIAGNOSIS — M79671 Pain in right foot: Secondary | ICD-10-CM

## 2020-11-19 DIAGNOSIS — M19071 Primary osteoarthritis, right ankle and foot: Secondary | ICD-10-CM | POA: Diagnosis not present

## 2020-11-19 LAB — COMPREHENSIVE METABOLIC PANEL
ALT: 21 U/L (ref 0–35)
AST: 17 U/L (ref 0–37)
Albumin: 4 g/dL (ref 3.5–5.2)
Alkaline Phosphatase: 61 U/L (ref 39–117)
BUN: 22 mg/dL (ref 6–23)
CO2: 25 mEq/L (ref 19–32)
Calcium: 8.9 mg/dL (ref 8.4–10.5)
Chloride: 104 mEq/L (ref 96–112)
Creatinine, Ser: 0.69 mg/dL (ref 0.40–1.20)
GFR: 89.54 mL/min (ref >60.00)
Glucose, Bld: 86 mg/dL (ref 70–99)
Potassium: 4 mEq/L (ref 3.5–5.1)
Sodium: 138 mEq/L (ref 135–145)
Total Bilirubin: 0.4 mg/dL (ref 0.2–1.2)
Total Protein: 6.8 g/dL (ref 6.0–8.3)

## 2020-11-19 LAB — URIC ACID: Uric Acid, Serum: 6.7 mg/dL (ref 2.4–7.0)

## 2020-11-19 LAB — BRAIN NATRIURETIC PEPTIDE: Pro B Natriuretic peptide (BNP): 12 pg/mL (ref 0.0–100.0)

## 2020-11-19 NOTE — Patient Instructions (Signed)
Please call Medical Records at [phone];  You can also call [facility] to establish with one of the providers at that location.

## 2020-11-19 NOTE — Progress Notes (Signed)
Brenda [name] is a 70 y.o. female with the following history as recorded in EpicCare:  Patient Active Problem List   Diagnosis Date Noted   Patellar subluxation, right, initial encounter 09/23/2018   Degenerative arthritis of knee, bilateral 03/20/2018   Pre-diabetes 01/17/2018   Hyperglycemia 12/24/2017   Cough 01/22/2017   Arthralgia 11/17/2015   Left lumbar radiculopathy 10/25/2015   Low back pain 08/25/2015   Menopausal state 08/22/2011   Routine general medical examination at a health care facility 09/28/2010   Hyperlipidemia 04/25/2010   HEARING LOSS 09/15/2009   Disorder of bone and cartilage 04/20/2007    Current Outpatient Medications  Medication Sig Dispense Refill   acetaminophen (TYLENOL) 500 MG tablet Take 500 mg by mouth every 6 (six) hours as needed (for pain.).     atorvastatin (LIPITOR) 10 MG tablet Take 1 tablet (10 mg total) by mouth daily. 90 tablet 3   gabapentin (NEURONTIN) 100 MG capsule Take 2 capsules (200 mg total) by mouth at bedtime. 60 capsule 3   hydrOXYzine (ATARAX/VISTARIL) 25 MG tablet Take 1 tablet (25 mg total) by mouth every 8 (eight) hours as needed (anxiety/itching.). 90 tablet 3   metroNIDAZOLE (METROCREAM) 0.75 % cream Apply 1 application topically in the morning and at bedtime.   2   omeprazole (PRILOSEC) 20 MG capsule Take 20 mg by mouth daily.     promethazine-dextromethorphan (PROMETHAZINE-DM) 6.25-15 MG/5ML syrup Take 5 mLs by mouth 4 (four) times daily as needed for cough. 180 mL 0   Vitamin D, Ergocalciferol, (DRISDOL) 1.25 MG (50000 UNIT) CAPS capsule Take 1 capsule (50,000 Units total) by mouth every 7 (seven) days. (Patient not taking: Reported on 11/19/2020) 12 capsule 0   No current facility-administered medications for this visit.    Allergies: Hydrocodone  Past Medical History:  Diagnosis Date   ABDOMINAL/PELVIC SWELLING MASS/LUMP UNSPEC SITE 09/18/2007   ACUTE URIS OF UNSPECIFIED SITE 06/06/2007   Allergy    Anemia    Anxiety     no meds   Arthritis of hip    08/13/2019: per patient "both hips"   Arthritis of knee, left    Arthritis of right knee    ASTHMATIC BRONCHITIS, ACUTE 05/11/2008   DM 09/18/2007   borderline -diet controlled- no meds   GERD (gastroesophageal reflux disease)    Glucose intolerance (impaired glucose tolerance)    HEARING LOSS 09/15/2009    mild -no hearing aids   HYPERLIPIDEMIA 04/25/2010   OSTEOPENIA 04/20/2007   Palpitations 03/13/2008   Pneumonia    THYROID NODULE, RIGHT 05/11/2008    Past Surgical History:  Procedure Laterality Date   BREAST BIOPSY Left 05/22/2019   DILATION AND CURETTAGE, DIAGNOSTIC / THERAPEUTIC     HAMMER TOE SURGERY     RIGHT   HERNIA REPAIR  1992   KNEE SURGERY  1991   Left   Left Knee cyst  1985   Lymph Node Removal     RADIOACTIVE SEED GUIDED EXCISIONAL BREAST BIOPSY Left 08/19/2019   Procedure: LEFT BREAST RADIOACTIVE SEED GUIDED EXCISIONAL BREAST BIOPSY;  Surgeon: Stark Klein, MD;  Location: [facility];  Service: General;  Laterality: Left;    Family History  Problem Relation Age of Onset   Cancer Mother        Breast Cancer   Breast cancer Mother    Cancer Father        "Stomach" Cancer   Stomach cancer Father    Cancer Sister  Colon Cancer   Breast cancer Sister    Kidney disease Brother        Kidney Transplant   Colon cancer Neg Hx    Esophageal cancer Neg Hx    Rectal cancer Neg Hx     Social History   Tobacco Use   Smoking status: Never   Smokeless tobacco: Never  Substance Use Topics   Alcohol use: Yes    Alcohol/week: 14.0 standard drinks    Types: 14 Glasses of wine per week    Subjective:   Complaining of pain/ swelling in her inner left foot/ ankle x 2-4 weeks; no known injury or trauma- does exercise regularly but notes that shoes are fairly new; no pain or swelling noted in lower right leg; notes she is very concerned about her circulation as she has a spot on her inner left leg that she feels has not healed in over a year;     Objective:  Vitals:   11/19/20 1138  BP: 110/60  Pulse: 85  Temp: 97.6 F (36.4 C)  TempSrc: Oral  SpO2: 98%  Weight: 167 lb 6.4 oz (75.9 kg)  Height: 5' 6" (1.676 m)    General: Well developed, well nourished, in no acute distress  Head: Normocephalic and atraumatic  Eyes: Sclera and conjunctiva clear; pupils round and reactive to light; extraocular movements intact  Ears: External normal; canals clear; tympanic membranes normal  Oropharynx: Pink, supple. No suspicious lesions  Neck: Supple without thyromegaly, adenopathy  Lungs: Respirations unlabored;  Musculoskeletal: No deformities; no active joint inflammation  Extremities: No edema, cyanosis, clubbing; localized swelling over medial aspect of right foot Vessels: Symmetric bilaterally  Neurologic: Alert and oriented; speech intact; face symmetrical; moves all extremities well; CNII-XII intact without focal deficit   Assessment:  1. Right foot pain   2. Foot swelling     Plan:  Suspect soft tissue injury but agree with her concerns about her circulation; Update X-ray and labs; patient will go to [place] location due to cost concerns of X-ray at [facility] location; will refer to vascular specialist; follow up to be determined; will hold on doppler today as there is no swelling or pain in right lower leg- will consider based on lab results.   This visit occurred during the SARS-CoV-2 public health emergency.  Safety protocols were in place, including screening questions prior to the visit, additional usage of staff PPE, and extensive cleaning of exam room while observing appropriate contact time as indicated for disinfecting solutions.    No follow-ups on file.  Orders Placed This Encounter  Procedures   DG Foot Complete Right    Standing Status:   Future    Standing Expiration Date:   11/19/2021    Order Specific Question:   Reason for Exam (SYMPTOM  OR DIAGNOSIS REQUIRED)    Answer:   right foot pain/ swelling    Order  Specific Question:   Preferred imaging location?    Answer:   [facility]   Uric acid    Standing Status:   Future    Standing Expiration Date:   11/19/2021   Comp Met (CMET)    Standing Status:   Future    Standing Expiration Date:   11/19/2021   D-Dimer, Quantitative    Standing Status:   Future    Standing Expiration Date:   11/19/2021   Brain natriuretic peptide    Standing Status:   Future    Standing Expiration Date:   11/19/2021  Ambulatory referral to Vascular Surgery    Referral Priority:   Routine    Referral Type:   Surgical    Referral Reason:   Specialty Services Required    Requested Specialty:   Vascular Surgery    Number of Visits Requested:   1    Requested Prescriptions    No prescriptions requested or ordered in this encounter

## 2020-11-20 LAB — D-DIMER, QUANTITATIVE: D-Dimer, Quant: <0.19 mcg/mL FEU (ref <0.50)

## 2020-11-23 ENCOUNTER — Other Ambulatory Visit: Payer: Self-pay | Admitting: Family

## 2020-11-23 DIAGNOSIS — M79671 Pain in right foot: Secondary | ICD-10-CM

## 2020-11-23 NOTE — Progress Notes (Signed)
I have called pt and relayed the results. She stated understanding.

## 2020-11-25 ENCOUNTER — Other Ambulatory Visit: Payer: Self-pay

## 2020-11-25 DIAGNOSIS — M7989 Other specified soft tissue disorders: Secondary | ICD-10-CM

## 2020-11-30 ENCOUNTER — Ambulatory Visit: Payer: Medicare HMO

## 2020-11-30 ENCOUNTER — Ambulatory Visit (INDEPENDENT_AMBULATORY_CARE_PROVIDER_SITE_OTHER): Payer: Medicare HMO | Admitting: Podiatry

## 2020-11-30 ENCOUNTER — Other Ambulatory Visit: Payer: Self-pay

## 2020-11-30 DIAGNOSIS — M7750 Other enthesopathy of unspecified foot: Secondary | ICD-10-CM

## 2020-11-30 DIAGNOSIS — F4312 Post-traumatic stress disorder, chronic: Secondary | ICD-10-CM | POA: Insufficient documentation

## 2020-11-30 DIAGNOSIS — N841 Polyp of cervix uteri: Secondary | ICD-10-CM | POA: Insufficient documentation

## 2020-11-30 DIAGNOSIS — G629 Polyneuropathy, unspecified: Secondary | ICD-10-CM

## 2020-11-30 DIAGNOSIS — R69 Illness, unspecified: Secondary | ICD-10-CM | POA: Insufficient documentation

## 2020-11-30 DIAGNOSIS — N946 Dysmenorrhea, unspecified: Secondary | ICD-10-CM | POA: Insufficient documentation

## 2020-11-30 DIAGNOSIS — L259 Unspecified contact dermatitis, unspecified cause: Secondary | ICD-10-CM | POA: Insufficient documentation

## 2020-11-30 DIAGNOSIS — F1010 Alcohol abuse, uncomplicated: Secondary | ICD-10-CM | POA: Insufficient documentation

## 2020-11-30 DIAGNOSIS — L509 Urticaria, unspecified: Secondary | ICD-10-CM | POA: Insufficient documentation

## 2020-11-30 DIAGNOSIS — G4700 Insomnia, unspecified: Secondary | ICD-10-CM | POA: Insufficient documentation

## 2020-11-30 DIAGNOSIS — H9193 Unspecified hearing loss, bilateral: Secondary | ICD-10-CM | POA: Insufficient documentation

## 2020-11-30 NOTE — Patient Instructions (Signed)
Look for Voltaren gel at the pharmacy over the counter or online (also known as diclofenac 1% gel). Apply to the painful areas 3-4x daily with the supplied dosing card. Allow to dry for 10 minutes before going into socks/shoes   I think some of the pain you are having may be from your spine, it would be good to have that checked  Turmeric is a natural anti inflammatory that may be beneficial to supplement  I do not see any issue with the tendons or bones in your feet that would cause this   It would still be good to have your circulation evaluated

## 2020-12-02 ENCOUNTER — Other Ambulatory Visit: Payer: Self-pay

## 2020-12-02 ENCOUNTER — Ambulatory Visit (HOSPITAL_COMMUNITY)
Admission: RE | Admit: 2020-12-02 | Discharge: 2020-12-02 | Disposition: A | Discharge status: Home / Self Care | Payer: Medicare HMO | Source: Ambulatory Visit | Attending: Vascular Surgery | Admitting: Vascular Surgery

## 2020-12-02 DIAGNOSIS — M7989 Other specified soft tissue disorders: Secondary | ICD-10-CM | POA: Insufficient documentation

## 2020-12-02 NOTE — Progress Notes (Signed)
  Subjective:  Patient ID: Brenda [name], female    DOB: 08-03-1950,  MRN: [id number]  Chief Complaint  Patient presents with   Foot Pain    (np) Right foot pain    70 y.o. female presents with the above complaint. History confirmed with patient.  Pain is over the top of the arch has been going on for several months its intermittent.  She had x-rays previously.  She was told there is a bone spur on the third met but does not with pain is.  Objective:  Physical Exam: warm, good capillary refill, no trophic changes or ulcerative lesions, normal DP and PT pulses, and normal sensory exam.  Right Foot: Prominent transverse arch dorsally no Tinel's sign or reproducible pain on palpation to the deep peroneal nerve or dorsal medial cutaneous nerve  Reviewed her previous radiographs she may have some very mild midtarsal arthritis Assessment:   1. Peripheral neuritis      Plan:  Patient was evaluated and treated and all questions answered.  Discussed with her I think likely she has some neuritis of the dorsal cutaneous nerves secondary to a prominent arch and possible sugar compression.  Would avoid pressure from shoes in this area.  She may have some mild midtarsal arthritis but nothing is severe enough that I think requires injection or surgical consideration at this point.  She will try some natural remedies as well.  Also discussed Voltaren gel may be helpful.  She return to see as needed for this  Return if symptoms worsen or fail to improve.

## 2020-12-14 ENCOUNTER — Encounter: Payer: Self-pay | Admitting: Vascular Surgery

## 2020-12-14 ENCOUNTER — Ambulatory Visit (INDEPENDENT_AMBULATORY_CARE_PROVIDER_SITE_OTHER): Payer: Medicare HMO | Admitting: Vascular Surgery

## 2020-12-14 ENCOUNTER — Other Ambulatory Visit: Payer: Self-pay

## 2020-12-14 DIAGNOSIS — M7989 Other specified soft tissue disorders: Secondary | ICD-10-CM | POA: Diagnosis not present

## 2020-12-14 NOTE — Progress Notes (Signed)
Patient name: Brenda [name] MRN: [id number] DOB: July 26, 1950 Sex: female  REASON FOR CONSULT: Evaluate right ankle/foot swelling  HPI: Brenda [name] is a 70 y.o. female, with history of hypertension and hyperlipidemia who presents for evaluation of swelling in the right foot and ankle.  She states this has been ongoing for least 6 years and she feels it has gotten worse over the last month or so.  She has also been concerned about the risk of losing her leg.  She also describes a history of spinal stenosis.  She does get some numbness in both legs as well.  She is retired from [employer].  No history of trauma.  No history of DVT.  States she used to wear compression when she was flying but is not traveling anymore.  Past Medical History:  Diagnosis Date   ABDOMINAL/PELVIC SWELLING MASS/LUMP UNSPEC SITE 09/18/2007   ACUTE URIS OF UNSPECIFIED SITE 06/06/2007   Allergy    Anemia    Anxiety    no meds   Arthritis of hip    08/13/2019: per patient "both hips"   Arthritis of knee, left    Arthritis of right knee    ASTHMATIC BRONCHITIS, ACUTE 05/11/2008   DM 09/18/2007   borderline -diet controlled- no meds   GERD (gastroesophageal reflux disease)    Glucose intolerance (impaired glucose tolerance)    HEARING LOSS 09/15/2009    mild -no hearing aids   HYPERLIPIDEMIA 04/25/2010   OSTEOPENIA 04/20/2007   Palpitations 03/13/2008   Pneumonia    THYROID NODULE, RIGHT 05/11/2008    Past Surgical History:  Procedure Laterality Date   BREAST BIOPSY Left 05/22/2019   DILATION AND CURETTAGE, DIAGNOSTIC / THERAPEUTIC     HAMMER TOE SURGERY     RIGHT   HERNIA REPAIR  1992   KNEE SURGERY  1991   Left   Left Knee cyst  1985   Lymph Node Removal     RADIOACTIVE SEED GUIDED EXCISIONAL BREAST BIOPSY Left 08/19/2019   Procedure: LEFT BREAST RADIOACTIVE SEED GUIDED EXCISIONAL BREAST BIOPSY;  Surgeon: Stark Klein, MD;  Location: [facility] OR;  Service: General;  Laterality: Left;    Family History  Problem Relation  Age of Onset   Cancer Mother        Breast Cancer   Breast cancer Mother    Cancer Father        "Stomach" Cancer   Stomach cancer Father    Cancer Sister        Colon Cancer   Breast cancer Sister    Kidney disease Brother        Kidney Transplant   Colon cancer Neg Hx    Esophageal cancer Neg Hx    Rectal cancer Neg Hx     SOCIAL HISTORY: Social History   Socioeconomic History   Marital status: Married    Spouse name: Not on file   Number of children: Not on file   Years of education: Not on file   Highest education level: Not on file  Occupational History   Occupation: Nurse, adult: [employer]  Tobacco Use   Smoking status: Never   Smokeless tobacco: Never  Vaping Use   Vaping Use: Never used  Substance and Sexual Activity   Alcohol use: Yes    Alcohol/week: 14.0 standard drinks    Types: 14 Glasses of wine per week   Drug use: No   Sexual activity: Yes  Birth control/protection: Post-menopausal  Other Topics Concern   Not on file  Social History Narrative   Not on file   Social Determinants of Health   Financial Resource Strain: Not on file  Food Insecurity: Not on file  Transportation Needs: Not on file  Physical Activity: Not on file  Stress: Not on file  Social Connections: Not on file  Intimate Partner Violence: Not on file    Allergies  Allergen Reactions   Hydrocodone Other (See Comments)    drowsiness    Current Outpatient Medications  Medication Sig Dispense Refill   acetaminophen (TYLENOL) 500 MG tablet Take 500 mg by mouth every 6 (six) hours as needed (for pain.).     atorvastatin (LIPITOR) 10 MG tablet Take 1 tablet (10 mg total) by mouth daily. 90 tablet 3   atorvastatin (LIPITOR) 20 MG tablet Take 0.5 tablets by mouth at bedtime.     clobetasol cream (TEMOVATE) 0.05 % APPLY SMALL AMOUNT TO AFFECTED AREA TWICE A DAY     gabapentin (NEURONTIN) 100 MG capsule Take 2 capsules (200 mg total) by mouth at  bedtime. 60 capsule 3   gabapentin (NEURONTIN) 300 MG capsule TAKE 1 CAPSULE BY MOUTH THREE TIMES A DAY AS NEEDED     hydrOXYzine (ATARAX/VISTARIL) 25 MG tablet Take 1 tablet (25 mg total) by mouth every 8 (eight) hours as needed (anxiety/itching.). 90 tablet 3   ibuprofen (ADVIL) 400 MG tablet Take 1 tablet by mouth 3 (three) times daily as needed.     metroNIDAZOLE (METROCREAM) 0.75 % cream Apply 1 application topically in the morning and at bedtime.   2   omeprazole (PRILOSEC) 20 MG capsule Take 20 mg by mouth daily.     omeprazole (PRILOSEC) 20 MG capsule TAKE 1 CAPSULE BY MOUTH     promethazine-dextromethorphan (PROMETHAZINE-DM) 6.25-15 MG/5ML syrup Take 5 mLs by mouth 4 (four) times daily as needed for cough. 180 mL 0   Vitamin D, Ergocalciferol, (DRISDOL) 1.25 MG (50000 UNIT) CAPS capsule Take 1 capsule (50,000 Units total) by mouth every 7 (seven) days. 12 capsule 0   No current facility-administered medications for this visit.    REVIEW OF SYSTEMS:  '[X]'$  denotes positive finding, '[ ]'$  denotes negative finding Cardiac  Comments:  Chest pain or chest pressure:    Shortness of breath upon exertion:    Short of breath when lying flat:    Irregular heart rhythm:        Vascular    Pain in calf, thigh, or hip brought on by ambulation:    Pain in feet at night that wakes you up from your sleep:     Blood clot in your veins:    Leg swelling:  x Right      Pulmonary    Oxygen at home:    Productive cough:     Wheezing:         Neurologic    Sudden weakness in arms or legs:     Sudden numbness in arms or legs:     Sudden onset of difficulty speaking or slurred speech:    Temporary loss of vision in one eye:     Problems with dizziness:         Gastrointestinal    Blood in stool:     Vomited blood:         Genitourinary    Burning when urinating:     Blood in urine:        Psychiatric  Major depression:         Hematologic    Bleeding problems:    Problems with blood  clotting too easily:        Skin    Rashes or ulcers:        Constitutional    Fever or chills:      PHYSICAL EXAM: Vitals:   12/14/20 1004  BP: 131/81  Pulse: 80  Resp: 14  Temp: (!) 97.2 F (36.2 C)  TempSrc: Temporal  SpO2: 96%  Weight: 165 lb (74.8 kg)  Height: '5\' 6"'$  (1.676 m)    GENERAL: The patient is a well-nourished female, in no acute distress. The vital signs are documented above. CARDIAC: There is a regular rate and rhythm.  VASCULAR:  Palpable femoral pulses bilaterally Palpable DP PT pulses bilateral PULMONARY: No respiratory distress ABDOMEN: Soft and non-tender. MUSCULOSKELETAL: There are no major deformities or cyanosis. NEUROLOGIC: No focal weakness or paresthesias are detected. SKIN: There are no ulcers or rashes noted. PSYCHIATRIC: The patient has a normal affect.  DATA:   ABIs today are 1.19 on the right triphasic and 1.29 on the left triphasic with no evidence of lower extremity arterial disease  Assessment/Plan:  70 year old female presents for evaluation of right foot and ankle swelling.  Her initial concern is risk of losing her extremity long-term.  I discussed she did have ABIs that are normal with no evidence of arterial disease.  In addition she has normal exam with palpable pedal pulses.  Discussed that I think she is at no increased risk for limb loss given no evidence of arterial disease.  I think she does need a lower extremity reflux study on the right and we discussed possible venous disease as etiology for her leg swelling.  She wishes to return and see me and we will get a right lower extremity venous reflux study and follow-up afterwards.  Also briefly discussed venous disease with her today.   Marty Heck, MD [facility] Office: [phone]

## 2020-12-15 ENCOUNTER — Other Ambulatory Visit: Payer: Self-pay

## 2020-12-15 DIAGNOSIS — M7989 Other specified soft tissue disorders: Secondary | ICD-10-CM

## 2021-01-25 ENCOUNTER — Other Ambulatory Visit: Payer: Self-pay

## 2021-01-25 ENCOUNTER — Ambulatory Visit (HOSPITAL_COMMUNITY)
Admission: RE | Admit: 2021-01-25 | Discharge: 2021-01-25 | Disposition: A | Discharge status: Home / Self Care | Payer: Medicare HMO | Source: Ambulatory Visit | Attending: Vascular Surgery | Admitting: Vascular Surgery

## 2021-01-25 ENCOUNTER — Ambulatory Visit (INDEPENDENT_AMBULATORY_CARE_PROVIDER_SITE_OTHER): Payer: Medicare HMO | Admitting: Vascular Surgery

## 2021-01-25 VITALS — BP 122/82 | HR 81 | Temp 97.4°F | Resp 16 | Ht 66.0 in | Wt 166.0 lb

## 2021-01-25 DIAGNOSIS — M7989 Other specified soft tissue disorders: Secondary | ICD-10-CM | POA: Insufficient documentation

## 2021-01-25 NOTE — Progress Notes (Signed)
Patient name: Brenda [name] MRN: [id number] DOB: 1950-07-20 Sex: female  REASON FOR CONSULT: Follow-up for right leg swelling  HPI: Brenda [name] is a 70 y.o. female, with history of hypertension and hyperlipidemia who presents for follow-up of right leg swelling.  She was previously seen with ABIs and we recommended coming back with venous reflux study given her primary complaint was swelling.  She states most of the swelling is medial in the foot itself.  She states this has been ongoing for least 6 years and she feels it has gotten worse.  She also describes a history of spinal stenosis.  She does get some numbness in both legs as well.  She is retired from [employer].  No history of trauma.  No history of DVT.    Past Medical History:  Diagnosis Date   ABDOMINAL/PELVIC SWELLING MASS/LUMP UNSPEC SITE 09/18/2007   ACUTE URIS OF UNSPECIFIED SITE 06/06/2007   Allergy    Anemia    Anxiety    no meds   Arthritis of hip    08/13/2019: per patient "both hips"   Arthritis of knee, left    Arthritis of right knee    ASTHMATIC BRONCHITIS, ACUTE 05/11/2008   DM 09/18/2007   borderline -diet controlled- no meds   GERD (gastroesophageal reflux disease)    Glucose intolerance (impaired glucose tolerance)    HEARING LOSS 09/15/2009    mild -no hearing aids   HYPERLIPIDEMIA 04/25/2010   OSTEOPENIA 04/20/2007   Palpitations 03/13/2008   Pneumonia    THYROID NODULE, RIGHT 05/11/2008    Past Surgical History:  Procedure Laterality Date   BREAST BIOPSY Left 05/22/2019   DILATION AND CURETTAGE, DIAGNOSTIC / THERAPEUTIC     HAMMER TOE SURGERY     RIGHT   HERNIA REPAIR  1992   KNEE SURGERY  1991   Left   Left Knee cyst  1985   Lymph Node Removal     RADIOACTIVE SEED GUIDED EXCISIONAL BREAST BIOPSY Left 08/19/2019   Procedure: LEFT BREAST RADIOACTIVE SEED GUIDED EXCISIONAL BREAST BIOPSY;  Surgeon: Stark Klein, MD;  Location: [facility] OR;  Service: General;  Laterality: Left;    Family History  Problem Relation  Age of Onset   Cancer Mother        Breast Cancer   Breast cancer Mother    Cancer Father        "Stomach" Cancer   Stomach cancer Father    Cancer Sister        Colon Cancer   Breast cancer Sister    Kidney disease Brother        Kidney Transplant   Colon cancer Neg Hx    Esophageal cancer Neg Hx    Rectal cancer Neg Hx     SOCIAL HISTORY: Social History   Socioeconomic History   Marital status: Married    Spouse name: Not on file   Number of children: Not on file   Years of education: Not on file   Highest education level: Not on file  Occupational History   Occupation: Nurse, adult: [employer]  Tobacco Use   Smoking status: Never   Smokeless tobacco: Never  Vaping Use   Vaping Use: Never used  Substance and Sexual Activity   Alcohol use: Yes    Alcohol/week: 14.0 standard drinks    Types: 14 Glasses of wine per week   Drug use: No   Sexual activity: Yes  Birth control/protection: Post-menopausal  Other Topics Concern   Not on file  Social History Narrative   Not on file   Social Determinants of Health   Financial Resource Strain: Not on file  Food Insecurity: Not on file  Transportation Needs: Not on file  Physical Activity: Not on file  Stress: Not on file  Social Connections: Not on file  Intimate Partner Violence: Not on file    Allergies  Allergen Reactions   Hydrocodone Other (See Comments)    drowsiness    Current Outpatient Medications  Medication Sig Dispense Refill   acetaminophen (TYLENOL) 500 MG tablet Take 500 mg by mouth every 6 (six) hours as needed (for pain.).     atorvastatin (LIPITOR) 10 MG tablet Take 1 tablet (10 mg total) by mouth daily. 90 tablet 3   atorvastatin (LIPITOR) 20 MG tablet Take 0.5 tablets by mouth at bedtime.     clobetasol cream (TEMOVATE) 0.05 % APPLY SMALL AMOUNT TO AFFECTED AREA TWICE A DAY     gabapentin (NEURONTIN) 100 MG capsule Take 2 capsules (200 mg total) by mouth at  bedtime. 60 capsule 3   hydrOXYzine (ATARAX/VISTARIL) 25 MG tablet Take 1 tablet (25 mg total) by mouth every 8 (eight) hours as needed (anxiety/itching.). 90 tablet 3   ibuprofen (ADVIL) 400 MG tablet Take 1 tablet by mouth 3 (three) times daily as needed.     metroNIDAZOLE (METROCREAM) 0.75 % cream Apply 1 application topically in the morning and at bedtime.   2   omeprazole (PRILOSEC) 20 MG capsule Take 20 mg by mouth daily.     omeprazole (PRILOSEC) 20 MG capsule TAKE 1 CAPSULE BY MOUTH     promethazine-dextromethorphan (PROMETHAZINE-DM) 6.25-15 MG/5ML syrup Take 5 mLs by mouth 4 (four) times daily as needed for cough. 180 mL 0   Vitamin D, Ergocalciferol, (DRISDOL) 1.25 MG (50000 UNIT) CAPS capsule Take 1 capsule (50,000 Units total) by mouth every 7 (seven) days. 12 capsule 0   gabapentin (NEURONTIN) 300 MG capsule TAKE 1 CAPSULE BY MOUTH THREE TIMES A DAY AS NEEDED (Patient not taking: Reported on 01/25/2021)     No current facility-administered medications for this visit.    REVIEW OF SYSTEMS:  [X]  denotes positive finding, [ ]  denotes negative finding Cardiac  Comments:  Chest pain or chest pressure:    Shortness of breath upon exertion:    Short of breath when lying flat:    Irregular heart rhythm:        Vascular    Pain in calf, thigh, or hip brought on by ambulation:    Pain in feet at night that wakes you up from your sleep:     Blood clot in your veins:    Leg swelling:  x Right      Pulmonary    Oxygen at home:    Productive cough:     Wheezing:         Neurologic    Sudden weakness in arms or legs:     Sudden numbness in arms or legs:     Sudden onset of difficulty speaking or slurred speech:    Temporary loss of vision in one eye:     Problems with dizziness:         Gastrointestinal    Blood in stool:     Vomited blood:         Genitourinary    Burning when urinating:     Blood in urine:  Psychiatric    Major depression:         Hematologic     Bleeding problems:    Problems with blood clotting too easily:        Skin    Rashes or ulcers:        Constitutional    Fever or chills:      PHYSICAL EXAM: Vitals:   01/25/21 1417  BP: 122/82  Pulse: 81  Resp: 16  Temp: (!) 97.4 F (36.3 C)  TempSrc: Temporal  SpO2: 97%  Weight: 166 lb (75.3 kg)  Height: 5\' 6"  (1.676 m)    GENERAL: The patient is a well-nourished female, in no acute distress. The vital signs are documented above. CARDIAC: There is a regular rate and rhythm.  VASCULAR:  Palpable femoral pulses bilaterally Palpable PT pulses bilateral Mild right foot swelling PULMONARY: No respiratory distress ABDOMEN: Soft and non-tender. MUSCULOSKELETAL: There are no major deformities or cyanosis. NEUROLOGIC: No focal weakness or paresthesias are detected. SKIN: There are no ulcers or rashes noted. PSYCHIATRIC: The patient has a normal affect.  DATA:   Venous Reflux Times  +--------------+---------+------+-----------+------------+--------+  RIGHT         Reflux NoRefluxReflux TimeDiameter cmsComments                          Yes                                   +--------------+---------+------+-----------+------------+--------+  CFV           no                                              +--------------+---------+------+-----------+------------+--------+  FV mid        no                                              +--------------+---------+------+-----------+------------+--------+  Popliteal     no                                              +--------------+---------+------+-----------+------------+--------+  GSV at SFJ              yes                 0.57              +--------------+---------+------+-----------+------------+--------+  GSV prox thighno                            0.36              +--------------+---------+------+-----------+------------+--------+  GSV mid thigh no                             0.33              +--------------+---------+------+-----------+------------+--------+  GSV dist thighno  0.24              +--------------+---------+------+-----------+------------+--------+  GSV at knee   no                            0.25              +--------------+---------+------+-----------+------------+--------+  GSV prox calf           yes    >500 ms      0.19              +--------------+---------+------+-----------+------------+--------+  GSV mid calf  no                            0.20              +--------------+---------+------+-----------+------------+--------+  SSV Pop Fossa no                            0.18              +--------------+---------+------+-----------+------------+--------+  SSV prox calf no                            0.15              +--------------+---------+------+-----------+------------+--------+  SSV mid calf  no                            0.17              +--------------+---------+------+-----------+------------+--------+           Summary:  Right:  - No evidence of deep vein thrombosis seen in the right lower extremity,  from the common femoral through the popliteal veins.  - No evidence of superficial venous thrombosis in the right lower  extremity.  - No evidence of superficial venous reflux seen in the right short  saphenous vein.  - Venous reflux is noted in the right sapheno-femoral junction.  - Venous reflux is noted in the right greater saphenous vein in the calf.     *See table(s) above for measurements and observations.   Electronically signed by Monica Martinez MD on 01/25/2021 at 1:50:58 PM.         Final      Previous ABIs were 1.19 on the right triphasic and 1.29 on the left triphasic with no evidence of lower extremity arterial disease  Assessment/Plan:  70 year old female presents for further evaluation of her right lower  extremity swelling predominantly in the foot.  We had recommended a venous reflux study.  I discussed with her as pictured above her reflux study does not show any evidence of extensive long segment reflux that would explain her leg swelling.  She has no evidence of deep venous reflux.  The majority of her superficial venous system is competent as well.  I do not think this is the etiology for her swelling.  Discussed she can elevate her legs with compression stockings for conservative management but there is no role for surgical intervention including laser therapy.   Marty Heck, MD [facility] Office: [phone]

## 2021-02-14 ENCOUNTER — Encounter: Payer: Medicare HMO | Admitting: Emergency Medicine

## 2021-02-22 ENCOUNTER — Other Ambulatory Visit: Payer: Self-pay

## 2021-02-22 ENCOUNTER — Encounter: Payer: Self-pay | Admitting: Emergency Medicine

## 2021-02-22 ENCOUNTER — Ambulatory Visit (INDEPENDENT_AMBULATORY_CARE_PROVIDER_SITE_OTHER): Payer: Medicare HMO | Admitting: Emergency Medicine

## 2021-02-22 VITALS — BP 120/62 | HR 97 | Ht 66.0 in | Wt 169.0 lb

## 2021-02-22 DIAGNOSIS — M4800 Spinal stenosis, site unspecified: Secondary | ICD-10-CM | POA: Diagnosis not present

## 2021-02-22 DIAGNOSIS — R7303 Prediabetes: Secondary | ICD-10-CM | POA: Diagnosis not present

## 2021-02-22 DIAGNOSIS — Z23 Encounter for immunization: Secondary | ICD-10-CM

## 2021-02-22 DIAGNOSIS — Z7689 Persons encountering health services in other specified circumstances: Secondary | ICD-10-CM

## 2021-02-22 DIAGNOSIS — E785 Hyperlipidemia, unspecified: Secondary | ICD-10-CM

## 2021-02-22 HISTORY — DX: Spinal stenosis, site unspecified: M48.00

## 2021-02-22 LAB — CBC WITH DIFFERENTIAL/PLATELET
Basophils Absolute: 0.1 10*3/uL (ref 0.0–0.1)
Basophils Relative: 1.5 % (ref 0.0–3.0)
Eosinophils Absolute: 0.2 10*3/uL (ref 0.0–0.7)
Eosinophils Relative: 2.7 % (ref 0.0–5.0)
HCT: 39.5 % (ref 36.0–46.0)
Hemoglobin: 12.3 g/dL (ref 12.0–15.0)
Lymphocytes Relative: 33.2 % (ref 12.0–46.0)
Lymphs Abs: 2 10*3/uL (ref 0.7–4.0)
MCHC: 31.2 g/dL (ref 30.0–36.0)
MCV: 73.9 fl — ABNORMAL LOW (ref 78.0–100.0)
Monocytes Absolute: 0.8 10*3/uL (ref 0.1–1.0)
Monocytes Relative: 13.9 % — ABNORMAL HIGH (ref 3.0–12.0)
Neutro Abs: 2.9 10*3/uL (ref 1.4–7.7)
Neutrophils Relative %: 48.7 % (ref 43.0–77.0)
Platelets: 208 10*3/uL (ref 150.0–400.0)
RBC: 5.35 Mil/uL — ABNORMAL HIGH (ref 3.87–5.11)
RDW: 14.8 % (ref 11.5–15.5)
WBC: 5.9 10*3/uL (ref 4.0–10.5)

## 2021-02-22 LAB — LIPID PANEL
Cholesterol: 150 mg/dL (ref 0–200)
HDL: 49.6 mg/dL (ref >39.00)
LDL Cholesterol: 64 mg/dL (ref 0–99)
NonHDL: 100.44
Total CHOL/HDL Ratio: 3
Triglycerides: 182 mg/dL — ABNORMAL HIGH (ref 0.0–149.0)
VLDL: 36.4 mg/dL (ref 0.0–40.0)

## 2021-02-22 LAB — COMPREHENSIVE METABOLIC PANEL
ALT: 23 U/L (ref 0–35)
AST: 20 U/L (ref 0–37)
Albumin: 4.1 g/dL (ref 3.5–5.2)
Alkaline Phosphatase: 63 U/L (ref 39–117)
BUN: 23 mg/dL (ref 6–23)
CO2: 32 mEq/L (ref 19–32)
Calcium: 9 mg/dL (ref 8.4–10.5)
Chloride: 106 mEq/L (ref 96–112)
Creatinine, Ser: 0.69 mg/dL (ref 0.40–1.20)
GFR: 89.38 mL/min (ref >60.00)
Glucose, Bld: 74 mg/dL (ref 70–99)
Potassium: 3.8 mEq/L (ref 3.5–5.1)
Sodium: 142 mEq/L (ref 135–145)
Total Bilirubin: 0.4 mg/dL (ref 0.2–1.2)
Total Protein: 6.8 g/dL (ref 6.0–8.3)

## 2021-02-22 LAB — TSH: TSH: 1.19 u[IU]/mL (ref 0.35–5.50)

## 2021-02-22 LAB — HEMOGLOBIN A1C: Hgb A1c MFr Bld: 5.7 % (ref 4.6–6.5)

## 2021-02-22 MED ORDER — ATORVASTATIN CALCIUM 10 MG PO TABS: 10.0000 mg | ORAL_TABLET | Freq: Every day | ORAL | 3 refills | Status: DC

## 2021-02-22 NOTE — Progress Notes (Signed)
Brenda [name] 70 y.o.   Chief Complaint  Patient presents with   Transitions Of Care    physical    HISTORY OF PRESENT ILLNESS: This is a 70 y.o. female here for transition of care.  Used to see Marvis Repress, NP. Has the following chronic medical problems: 1.  History of spinal stenosis 2.  Prediabetes 3.  Dyslipidemia on atorvastatin 10 mg daily No complaints or medical concerns today.  HPI   Prior to Admission medications   Medication Sig Start Date End Date Taking? Authorizing Provider  acetaminophen (TYLENOL) 500 MG tablet Take 500 mg by mouth every 6 (six) hours as needed (for pain.).   Yes [provider]  clobetasol cream (TEMOVATE) 0.05 % APPLY SMALL AMOUNT TO AFFECTED AREA TWICE A DAY 10/14/15  Yes [provider]  gabapentin (NEURONTIN) 100 MG capsule Take 2 capsules (200 mg total) by mouth at bedtime. 03/20/18  Yes Lyndal Pulley, DO  hydrOXYzine (ATARAX/VISTARIL) 25 MG tablet Take 1 tablet (25 mg total) by mouth every 8 (eight) hours as needed (anxiety/itching.). 02/09/20  Yes Marrian Salvage, FNP  ibuprofen (ADVIL) 400 MG tablet Take 1 tablet by mouth 3 (three) times daily as needed. 01/08/13  Yes [provider]  metroNIDAZOLE (METROCREAM) 0.75 % cream Apply 1 application topically in the morning and at bedtime.  01/03/18  Yes [provider]  omeprazole (PRILOSEC) 20 MG capsule Take 20 mg by mouth daily.   Yes [provider]  Vitamin D, Ergocalciferol, (DRISDOL) 1.25 MG (50000 UNIT) CAPS capsule Take 1 capsule (50,000 Units total) by mouth every 7 (seven) days. 05/13/19  Yes Lyndal Pulley, DO  atorvastatin (LIPITOR) 10 MG tablet Take 1 tablet (10 mg total) by mouth daily. 02/22/21   Horald Pollen, MD  promethazine-dextromethorphan (PROMETHAZINE-DM) 6.25-15 MG/5ML syrup Take 5 mLs by mouth 4 (four) times daily as needed for cough. Patient not taking: Reported on 02/22/2021 07/19/20   Marrian Salvage, FNP     Allergies  Allergen Reactions   Hydrocodone Other (See Comments)    drowsiness    Patient Active Problem List   Diagnosis Date Noted   Alcohol abuse 11/30/2020   Bilateral hearing loss 11/30/2020   Insomnia 11/30/2020   Mucous polyp of cervix 11/30/2020   Degenerative arthritis of knee, bilateral 03/20/2018   Pre-diabetes 01/17/2018   Arthralgia 11/17/2015   Left lumbar radiculopathy 10/25/2015   Menopausal state 08/22/2011   Routine general medical examination at a health care facility 09/28/2010   HEARING LOSS 09/15/2009    Past Medical History:  Diagnosis Date   ABDOMINAL/PELVIC SWELLING MASS/LUMP UNSPEC SITE 09/18/2007   ACUTE URIS OF UNSPECIFIED SITE 06/06/2007   Allergy    Anemia    Anxiety    no meds   Arthritis of hip    08/13/2019: per patient "both hips"   Arthritis of knee, left    Arthritis of right knee    ASTHMATIC BRONCHITIS, ACUTE 05/11/2008   DM 09/18/2007   borderline -diet controlled- no meds   GERD (gastroesophageal reflux disease)    Glucose intolerance (impaired glucose tolerance)    HEARING LOSS 09/15/2009    mild -no hearing aids   HYPERLIPIDEMIA 04/25/2010   OSTEOPENIA 04/20/2007   Palpitations 03/13/2008   Pneumonia    THYROID NODULE, RIGHT 05/11/2008    Past Surgical History:  Procedure Laterality Date   BREAST BIOPSY Left 05/22/2019   DILATION AND CURETTAGE, DIAGNOSTIC / THERAPEUTIC     HAMMER TOE  SURGERY     RIGHT   HERNIA REPAIR  1992   KNEE SURGERY  1991   Left   Left Knee cyst  1985   Lymph Node Removal     RADIOACTIVE SEED GUIDED EXCISIONAL BREAST BIOPSY Left 08/19/2019   Procedure: LEFT BREAST RADIOACTIVE SEED GUIDED EXCISIONAL BREAST BIOPSY;  Surgeon: Stark Klein, MD;  Location: [facility];  Service: General;  Laterality: Left;    Social History   Socioeconomic History   Marital status: Married    Spouse name: Not on file   Number of children: Not on file   Years of education: Not on file   Highest education level: Not on  file  Occupational History   Occupation: Nurse, 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[employer] [place]  Tobacco Use   Smoking status: Never   Smokeless tobacco: Never  Vaping Use   Vaping Use: Never used  Substance and Sexual Activity   Alcohol use: Yes    Alcohol/week: 14.0 standard drinks    Types: 14 Glasses of wine per week   Drug use: No   Sexual activity: Yes    Birth control/protection: Post-menopausal  Other Topics Concern   Not on file  Social History Narrative   Not on file   Social Determinants of Health   Financial Resource Strain: Not on file  Food Insecurity: Not on file  Transportation Needs: Not on file  Physical Activity: Not on file  Stress: Not on file  Social Connections: Not on file  Intimate Partner Violence: Not on file    Family History  Problem Relation Age of Onset   Cancer Mother        Breast Cancer   Breast cancer Mother    Cancer Father        "Stomach" Cancer   Stomach cancer Father    Cancer Sister        Colon Cancer   Breast cancer Sister    Kidney disease Brother        Kidney Transplant   Colon cancer Neg Hx    Esophageal cancer Neg Hx    Rectal cancer Neg Hx      Review of Systems  Constitutional: Negative.  Negative for chills and fever.  HENT: Negative.  Negative for congestion and sore throat.   Respiratory: Negative.  Negative for cough and shortness of breath.   Cardiovascular: Negative.  Negative for chest pain and palpitations.  Gastrointestinal: Negative.  Negative for abdominal pain, diarrhea, nausea and vomiting.  Genitourinary: Negative.   Skin: Negative.  Negative for rash.  Neurological: Negative.  Negative for dizziness and headaches.  All other systems reviewed and are negative. Today's Vitals   02/22/21 1111  BP: 120/62  Pulse: 97  SpO2: 100%  Weight: 169 lb (76.7 kg)  Height: 5\' 6"  (1.676 m)   Body mass index is 27.28 kg/m.   Physical Exam Vitals reviewed.  Constitutional:      Appearance: Normal  appearance.  HENT:     Head: Normocephalic.  Eyes:     Extraocular Movements: Extraocular movements intact.     Pupils: Pupils are equal, round, and reactive to light.  Cardiovascular:     Rate and Rhythm: Normal rate and regular rhythm.     Pulses: Normal pulses.     Heart sounds: Normal heart sounds.  Pulmonary:     Effort: Pulmonary effort is normal.     Breath sounds: Normal breath sounds.  Musculoskeletal:     Cervical  back: Normal range of motion and neck supple. No tenderness.     Right lower leg: No edema.     Left lower leg: No edema.  Lymphadenopathy:     Cervical: No cervical adenopathy.  Skin:    General: Skin is warm and dry.     Capillary Refill: Capillary refill takes less than 2 seconds.  Neurological:     General: No focal deficit present.     Mental Status: She is alert and oriented to person, place, and time.  Psychiatric:        Mood and Affect: Mood normal.        Behavior: Behavior normal.     ASSESSMENT & PLAN: Problem List Items Addressed This Visit       Other   Hyperlipidemia    Cardiovascular risks associated with dyslipidemia discussed. Continue atorvastatin 10 mg daily. The 10-year ASCVD risk score (Arnett DK, et al., 2019) is: 14.9%*   Values used to calculate the score:     Age: 89 years     Sex: Female     Is Non-Hispanic African American: Yes     Diabetic: No     Tobacco smoker: Yes     Systolic Blood Pressure: 161 mmHg     Is BP treated: No     HDL Cholesterol: 58 mg/dL*     Total Cholesterol: 174 mg/dL*     * - Cholesterol units were assumed for this score calculation       Relevant Medications   atorvastatin (LIPITOR) 10 MG tablet   Prediabetes - Primary    Diet and nutrition discussed.  Inquired about GLP's but not interested in injectables. Lab Results  Component Value Date   HGBA1C 6.0 (H) 08/13/2019  A1c done today. Advised to decrease amount of daily carbohydrate intake.       Relevant Orders   CBC with  Differential/Platelet   Comprehensive metabolic panel   Hemoglobin A1c   Lipid panel   TSH   Spinal stenosis    Stable.  States she gets physical therapy and care at the [place] [facility].      Other Visit Diagnoses     Need for influenza vaccination       Relevant Orders   Flu Vaccine QUAD High Dose(Fluad) (Completed)   Encounter to establish care          Patient Instructions  Health Maintenance After Age 29 After age 66, you are at a higher risk for certain long-term diseases and infections as well as injuries from falls. Falls are a major cause of broken bones and head injuries in people who are older than age 53. Getting regular preventive care can help to keep you healthy and well. Preventive care includes getting regular testing and making lifestyle changes as recommended by your health care provider. Talk with your health care provider about: Which screenings and tests you should have. A screening is a test that checks for a disease when you have no symptoms. A diet and exercise plan that is right for you. What should I know about screenings and tests to prevent falls? Screening and testing are the best ways to find a health problem early. Early diagnosis and treatment give you the best chance of managing medical conditions that are common after age 11. Certain conditions and lifestyle choices may make you more likely to have a fall. Your health care provider may recommend: Regular vision checks. Poor vision and conditions such as cataracts can make  you more likely to have a fall. If you wear glasses, make sure to get your prescription updated if your vision changes. Medicine review. Work with your health care provider to regularly review all of the medicines you are taking, including over-the-counter medicines. Ask your health care provider about any side effects that may make you more likely to have a fall. Tell your health care provider if any medicines that you take make you feel dizzy  or sleepy. Strength and balance checks. Your health care provider may recommend certain tests to check your strength and balance while standing, walking, or changing positions. Foot health exam. Foot pain and numbness, as well as not wearing proper footwear, can make you more likely to have a fall. Screenings, including: Osteoporosis screening. Osteoporosis is a condition that causes the bones to get weaker and break more easily. Blood pressure screening. Blood pressure changes and medicines to control blood pressure can make you feel dizzy. Depression screening. You may be more likely to have a fall if you have a fear of falling, feel depressed, or feel unable to do activities that you used to do. Alcohol use screening. Using too much alcohol can affect your balance and may make you more likely to have a fall. Follow these instructions at home: Lifestyle Do not drink alcohol if: Your health care provider tells you not to drink. If you drink alcohol: Limit how much you have to: 0-1 drink a day for women. 0-2 drinks a day for men. Know how much alcohol is in your drink. In the [place] one drink equals one 12 oz bottle of beer (355 mL), one 5 oz glass of wine (148 mL), or one 1 oz glass of hard liquor (44 mL). Do not use any products that contain nicotine or tobacco. These products include cigarettes, chewing tobacco, and vaping devices, such as e-cigarettes. If you need help quitting, ask your health care provider. Activity  Follow a regular exercise program to stay fit. This will help you maintain your balance. Ask your health care provider what types of exercise are appropriate for you. If you need a cane or walker, use it as recommended by your health care provider. Wear supportive shoes that have nonskid soles. Safety  Remove any tripping hazards, such as rugs, cords, and clutter. Install safety equipment such as grab bars in bathrooms and safety rails on stairs. Keep rooms and walkways  well-lit. General instructions Talk with your health care provider about your risks for falling. Tell your health care provider if: You fall. Be sure to tell your health care provider about all falls, even ones that seem minor. You feel dizzy, tiredness (fatigue), or off-balance. Take over-the-counter and prescription medicines only as told by your health care provider. These include supplements. Eat a healthy diet and maintain a healthy weight. A healthy diet includes low-fat dairy products, low-fat (lean) meats, and fiber from whole grains, beans, and lots of fruits and vegetables. Stay current with your vaccines. Schedule regular health, dental, and eye exams. Summary Having a healthy lifestyle and getting preventive care can help to protect your health and wellness after age 22. Screening and testing are the best way to find a health problem early and help you avoid having a fall. Early diagnosis and treatment give you the best chance for managing medical conditions that are more common for people who are older than age 86. Falls are a major cause of broken bones and head injuries in people who are older than age  65. Take precautions to prevent a fall at home. Work with your health care provider to learn what changes you can make to improve your health and wellness and to prevent falls. This information is not intended to replace advice given to you by your health care provider. Make sure you discuss any questions you have with your health care provider. Document Revised: 08/16/2020 Document Reviewed: 08/16/2020 Elsevier Patient Education  2022 [place], MD [facility]

## 2021-02-22 NOTE — Assessment & Plan Note (Signed)
Cardiovascular risks associated with dyslipidemia discussed. Continue atorvastatin 10 mg daily. The 10-year ASCVD risk score (Arnett DK, et al., 2019) is: 14.9%*   Values used to calculate the score:     Age: 70 years     Sex: Female     Is Non-Hispanic African American: Yes     Diabetic: No     Tobacco smoker: Yes     Systolic Blood Pressure: 283 mmHg     Is BP treated: No     HDL Cholesterol: 58 mg/dL*     Total Cholesterol: 174 mg/dL*     * - Cholesterol units were assumed for this score calculation

## 2021-02-22 NOTE — Assessment & Plan Note (Addendum)
Diet and nutrition discussed.  Inquired about GLP's but not interested in injectables. Lab Results  Component Value Date   HGBA1C 6.0 (H) 08/13/2019  A1c done today. Advised to decrease amount of daily carbohydrate intake.

## 2021-02-22 NOTE — Assessment & Plan Note (Signed)
Stable.  States she gets physical therapy and care at the [place] [facility].

## 2021-02-22 NOTE — Patient Instructions (Signed)
Health Maintenance After Age 68 After age 68, you are at a higher risk for certain long-term diseases and infections as well as injuries from falls. Falls are a major cause of broken bones and head injuries in people who are older than age 68. Getting regular preventive care can help to keep you healthy and well. Preventive care includes getting regular testing and making lifestyle changes as recommended by your health care provider. Talk with your health care provider about: Which screenings and tests you should have. A screening is a test that checks for a disease when you have no symptoms. A diet and exercise plan that is right for you. What should I know about screenings and tests to prevent falls? Screening and testing are the best ways to find a health problem early. Early diagnosis and treatment give you the best chance of managing medical conditions that are common after age 68. Certain conditions and lifestyle choices may make you more likely to have a fall. Your health care provider may recommend: Regular vision checks. Poor vision and conditions such as cataracts can make you more likely to have a fall. If you wear glasses, make sure to get your prescription updated if your vision changes. Medicine review. Work with your health care provider to regularly review all of the medicines you are taking, including over-the-counter medicines. Ask your health care provider about any side effects that may make you more likely to have a fall. Tell your health care provider if any medicines that you take make you feel dizzy or sleepy. Strength and balance checks. Your health care provider may recommend certain tests to check your strength and balance while standing, walking, or changing positions. Foot health exam. Foot pain and numbness, as well as not wearing proper footwear, can make you more likely to have a fall. Screenings, including: Osteoporosis screening. Osteoporosis is a condition that causes  the bones to get weaker and break more easily. Blood pressure screening. Blood pressure changes and medicines to control blood pressure can make you feel dizzy. Depression screening. You may be more likely to have a fall if you have a fear of falling, feel depressed, or feel unable to do activities that you used to do. Alcohol use screening. Using too much alcohol can affect your balance and may make you more likely to have a fall. Follow these instructions at home: Lifestyle Do not drink alcohol if: Your health care provider tells you not to drink. If you drink alcohol: Limit how much you have to: 0-1 drink a day for women. 0-2 drinks a day for men. Know how much alcohol is in your drink. In the [place] one drink equals one 12 oz bottle of beer (355 mL), one 5 oz glass of wine (148 mL), or one 1 oz glass of hard liquor (44 mL). Do not use any products that contain nicotine or tobacco. These products include cigarettes, chewing tobacco, and vaping devices, such as e-cigarettes. If you need help quitting, ask your health care provider. Activity  Follow a regular exercise program to stay fit. This will help you maintain your balance. Ask your health care provider what types of exercise are appropriate for you. If you need a cane or walker, use it as recommended by your health care provider. Wear supportive shoes that have nonskid soles. Safety  Remove any tripping hazards, such as rugs, cords, and clutter. Install safety equipment such as grab bars in bathrooms and safety rails on stairs. Keep rooms and walkways   well-lit. General instructions Talk with your health care provider about your risks for falling. Tell your health care provider if: You fall. Be sure to tell your health care provider about all falls, even ones that seem minor. You feel dizzy, tiredness (fatigue), or off-balance. Take over-the-counter and prescription medicines only as told by your health care provider. These include  supplements. Eat a healthy diet and maintain a healthy weight. A healthy diet includes low-fat dairy products, low-fat (lean) meats, and fiber from whole grains, beans, and lots of fruits and vegetables. Stay current with your vaccines. Schedule regular health, dental, and eye exams. Summary Having a healthy lifestyle and getting preventive care can help to protect your health and wellness after age 68. Screening and testing are the best way to find a health problem early and help you avoid having a fall. Early diagnosis and treatment give you the best chance for managing medical conditions that are more common for people who are older than age 68. Falls are a major cause of broken bones and head injuries in people who are older than age 68. Take precautions to prevent a fall at home. Work with your health care provider to learn what changes you can make to improve your health and wellness and to prevent falls. This information is not intended to replace advice given to you by your health care provider. Make sure you discuss any questions you have with your health care provider. Document Revised: 08/16/2020 Document Reviewed: 08/16/2020 Elsevier Patient Education  2022 [employer]  

## 2021-03-21 DIAGNOSIS — Z1231 Encounter for screening mammogram for malignant neoplasm of breast: Secondary | ICD-10-CM | POA: Diagnosis not present

## 2021-03-21 DIAGNOSIS — Z01419 Encounter for gynecological examination (general) (routine) without abnormal findings: Secondary | ICD-10-CM | POA: Diagnosis not present

## 2021-03-21 DIAGNOSIS — Z6827 Body mass index (BMI) 27.0-27.9, adult: Secondary | ICD-10-CM | POA: Diagnosis not present

## 2021-03-22 ENCOUNTER — Other Ambulatory Visit: Payer: Self-pay | Admitting: Obstetrics and Gynecology

## 2021-03-22 DIAGNOSIS — R928 Other abnormal and inconclusive findings on diagnostic imaging of breast: Secondary | ICD-10-CM

## 2021-04-22 ENCOUNTER — Ambulatory Visit: Payer: Medicare HMO

## 2021-04-22 ENCOUNTER — Ambulatory Visit
Admission: RE | Admit: 2021-04-22 | Discharge: 2021-04-22 | Disposition: A | Discharge status: Home / Self Care | Payer: Medicare PPO | Source: Ambulatory Visit | Attending: Obstetrics and Gynecology | Admitting: Obstetrics and Gynecology

## 2021-04-22 DIAGNOSIS — R928 Other abnormal and inconclusive findings on diagnostic imaging of breast: Secondary | ICD-10-CM

## 2021-04-27 ENCOUNTER — Ambulatory Visit (INDEPENDENT_AMBULATORY_CARE_PROVIDER_SITE_OTHER): Payer: Medicare PPO | Admitting: Emergency Medicine

## 2021-04-27 ENCOUNTER — Encounter: Payer: Self-pay | Admitting: Emergency Medicine

## 2021-04-27 ENCOUNTER — Other Ambulatory Visit: Payer: Self-pay

## 2021-04-27 ENCOUNTER — Ambulatory Visit (INDEPENDENT_AMBULATORY_CARE_PROVIDER_SITE_OTHER): Payer: Medicare PPO

## 2021-04-27 VITALS — BP 122/68 | HR 76 | Temp 98.1°F | Ht 66.0 in | Wt 168.0 lb

## 2021-04-27 DIAGNOSIS — M19012 Primary osteoarthritis, left shoulder: Secondary | ICD-10-CM | POA: Diagnosis not present

## 2021-04-27 DIAGNOSIS — M25512 Pain in left shoulder: Secondary | ICD-10-CM | POA: Diagnosis not present

## 2021-04-27 MED ORDER — DICLOFENAC SODIUM 75 MG PO TBEC: 75.0000 mg | DELAYED_RELEASE_TABLET | Freq: Two times a day (BID) | ORAL | 1 refills | Status: AC

## 2021-04-27 NOTE — Progress Notes (Signed)
Brenda [name] 71 y.o.   Chief Complaint  Patient presents with   Shoulder Pain    Left side, x 2 weeks    HISTORY OF PRESENT ILLNESS: This is a 71 y.o. female complaining of left shoulder pain for the last 2 weeks.  Denies injury. Pain is sharp and worse with movement.  No radiation or associated symptoms. No other complaints or medical concerns today.  Shoulder Pain  Pertinent negatives include no fever.    Prior to Admission medications   Medication Sig Start Date End Date Taking? Authorizing Provider  acetaminophen (TYLENOL) 500 MG tablet Take 500 mg by mouth every 6 (six) hours as needed (for pain.).   Yes [provider]  atorvastatin (LIPITOR) 10 MG tablet Take 1 tablet (10 mg total) by mouth daily. 02/22/21  Yes Yenny Kosa, Ines Bloomer, MD  clobetasol cream (TEMOVATE) 0.05 % APPLY SMALL AMOUNT TO AFFECTED AREA TWICE A DAY 10/14/15  Yes [provider]  gabapentin (NEURONTIN) 100 MG capsule Take 2 capsules (200 mg total) by mouth at bedtime. 03/20/18  Yes Lyndal Pulley, DO  hydrOXYzine (ATARAX/VISTARIL) 25 MG tablet Take 1 tablet (25 mg total) by mouth every 8 (eight) hours as needed (anxiety/itching.). 02/09/20  Yes Marrian Salvage, FNP  ibuprofen (ADVIL) 400 MG tablet Take 1 tablet by mouth 3 (three) times daily as needed. 01/08/13  Yes [provider]  metroNIDAZOLE (METROCREAM) 0.75 % cream Apply 1 application topically in the morning and at bedtime.  01/03/18  Yes [provider]  omeprazole (PRILOSEC) 20 MG capsule Take 20 mg by mouth daily.   Yes [provider]  Vitamin D, Ergocalciferol, (DRISDOL) 1.25 MG (50000 UNIT) CAPS capsule Take 1 capsule (50,000 Units total) by mouth every 7 (seven) days. 05/13/19  Yes Lyndal Pulley, DO    Allergies  Allergen Reactions   Hydrocodone Other (See Comments)    drowsiness    Patient Active Problem List   Diagnosis Date Noted   Spinal stenosis 02/22/2021   Alcohol abuse  11/30/2020   Bilateral hearing loss 11/30/2020   Insomnia 11/30/2020   Mucous polyp of cervix 11/30/2020   Degenerative arthritis of knee, bilateral 03/20/2018   Prediabetes 01/17/2018   Arthralgia 11/17/2015   Left lumbar radiculopathy 10/25/2015   Menopausal state 08/22/2011   Hyperlipidemia 04/25/2010   HEARING LOSS 09/15/2009    Past Medical History:  Diagnosis Date   ABDOMINAL/PELVIC SWELLING MASS/LUMP UNSPEC SITE 09/18/2007   ACUTE URIS OF UNSPECIFIED SITE 06/06/2007   Allergy    Anemia    Anxiety    no meds   Arthritis of hip    08/13/2019: per patient "both hips"   Arthritis of knee, left    Arthritis of right knee    ASTHMATIC BRONCHITIS, ACUTE 05/11/2008   DM 09/18/2007   borderline -diet controlled- no meds   GERD (gastroesophageal reflux disease)    Glucose intolerance (impaired glucose tolerance)    HEARING LOSS 09/15/2009    mild -no hearing aids   HYPERLIPIDEMIA 04/25/2010   OSTEOPENIA 04/20/2007   Palpitations 03/13/2008   Pneumonia    THYROID NODULE, RIGHT 05/11/2008    Past Surgical History:  Procedure Laterality Date   BREAST BIOPSY Left 05/22/2019   BREAST BIOPSY Left 06/2020   DILATION AND CURETTAGE, DIAGNOSTIC / THERAPEUTIC     HAMMER TOE SURGERY     RIGHT   HERNIA REPAIR  1992   KNEE SURGERY  1991   Left   Left Knee cyst  1985   Lymph Node Removal     RADIOACTIVE SEED GUIDED EXCISIONAL BREAST BIOPSY Left 08/19/2019   Procedure: LEFT BREAST RADIOACTIVE SEED GUIDED EXCISIONAL BREAST BIOPSY;  Surgeon: Stark Klein, MD;  Location: [facility];  Service: General;  Laterality: Left;    Social History   Socioeconomic History   Marital status: Married    Spouse name: Not on file   Number of children: Not on file   Years of education: Not on file   Highest education level: Not on file  Occupational History   Occupation: Nurse, adult: [employer]  Tobacco Use   Smoking status: Never   Smokeless tobacco: Never  Vaping Use    Vaping Use: Never used  Substance and Sexual Activity   Alcohol use: Yes    Alcohol/week: 14.0 standard drinks    Types: 14 Glasses of wine per week   Drug use: No   Sexual activity: Yes    Birth control/protection: Post-menopausal  Other Topics Concern   Not on file  Social History Narrative   Not on file   Social Determinants of Health   Financial Resource Strain: Not on file  Food Insecurity: Not on file  Transportation Needs: Not on file  Physical Activity: Not on file  Stress: Not on file  Social Connections: Not on file  Intimate Partner Violence: Not on file    Family History  Problem Relation Age of Onset   Cancer Mother        Breast Cancer   Breast cancer Mother    Cancer Father        "Stomach" Cancer   Stomach cancer Father    Cancer Sister        Colon Cancer   Breast cancer Sister    Kidney disease Brother        Kidney Transplant   Colon cancer Neg Hx    Esophageal cancer Neg Hx    Rectal cancer Neg Hx      Review of Systems  Constitutional: Negative.  Negative for chills and fever.  HENT: Negative.  Negative for congestion and sore throat.   Respiratory: Negative.  Negative for cough and shortness of breath.   Cardiovascular: Negative.  Negative for chest pain and palpitations.  Gastrointestinal:  Negative for abdominal pain, blood in stool, diarrhea, nausea and vomiting.  Musculoskeletal:  Positive for joint pain (Left shoulder).  Skin: Negative.  Negative for rash.  Neurological: Negative.  Negative for dizziness and headaches.  All other systems reviewed and are negative.  Today's Vitals   04/27/21 1334  BP: 122/68  Pulse: 76  Temp: 98.1 F (36.7 C)  TempSrc: Oral  SpO2: 97%  Weight: 168 lb (76.2 kg)  Height: 5\' 6"  (1.676 m)   Body mass index is 27.12 kg/m.  Physical Exam Vitals reviewed.  Constitutional:      Appearance: Normal appearance.  HENT:     Head: Normocephalic.  Eyes:     Extraocular Movements: Extraocular  movements intact.     Pupils: Pupils are equal, round, and reactive to light.  Cardiovascular:     Rate and Rhythm: Normal rate and regular rhythm.     Heart sounds: Normal heart sounds.  Pulmonary:     Effort: Pulmonary effort is normal.     Breath sounds: Normal breath sounds.  Musculoskeletal:     Cervical back: No tenderness.     Comments: Left shoulder: No erythema or ecchymosis.  No swelling.  No localized tenderness.  Limited range of motion due to pain.  Skin:    General: Skin is warm and dry.     Capillary Refill: Capillary refill takes less than 2 seconds.  Neurological:     General: No focal deficit present.     Mental Status: She is alert and oriented to person, place, and time.  Psychiatric:        Mood and Affect: Mood normal.        Behavior: Behavior normal.    DG Shoulder Left  Result Date: 04/27/2021 CLINICAL DATA:  Left shoulder pain EXAM: LEFT SHOULDER - 2+ VIEW COMPARISON:  None. FINDINGS: Alignment is anatomic. No acute fracture. Joint spaces preserved. Minor degenerative spurring at the glenohumeral joint. Mild degenerative changes at the acromioclavicular joint. IMPRESSION: Minor degenerative changes at the glenohumeral joint. Electronically Signed   By: Macy Mis M.D.   On: 04/27/2021 14:02    ASSESSMENT & PLAN: Problem List Items Addressed This Visit       Musculoskeletal and Integument   Primary osteoarthritis of left shoulder   Relevant Medications   diclofenac (VOLTAREN) 75 MG EC tablet   Other Relevant Orders   Ambulatory referral to Sports Medicine   Other Visit Diagnoses     Acute pain of left shoulder    -  Primary   Relevant Medications   diclofenac (VOLTAREN) 75 MG EC tablet   Other Relevant Orders   DG Shoulder Left (Completed)   Ambulatory referral to Sports Medicine      Patient Instructions  Shoulder Pain Many things can cause shoulder pain, including: An injury. Moving the shoulder in the same way again and again  (overuse). Joint pain (arthritis). Pain can come from: Swelling and irritation (inflammation) of any part of the shoulder. An injury to the shoulder joint. An injury to: Tissues that connect muscle to bone (tendons). Tissues that connect bones to each other (ligaments). Bones. Follow these instructions at home: Watch for changes in your symptoms. Let your doctor know about them. Follow these instructions to help with your pain. If you have a sling: Wear the sling as told by your doctor. Remove it only as told by your doctor. Loosen the sling if your fingers: Tingle. Become numb. Turn cold and Volpi. Keep the sling clean. If the sling is not waterproof: Do not let it get wet. Take the sling off when you shower or bathe. Managing pain, stiffness, and swelling  If told, put ice on the painful area: Put ice in a plastic bag. Place a towel between your skin and the bag. Leave the ice on for 20 minutes, 2-3 times a day. Stop putting ice on if it does not help with the pain. Squeeze a soft ball or a foam pad as much as possible. This prevents swelling in the shoulder. It also helps to strengthen the arm. General instructions Take over-the-counter and prescription medicines only as told by your doctor. Keep all follow-up visits as told by your doctor. This is important. Contact a doctor if: Your pain gets worse. Medicine does not help your pain. You have new pain in your arm, hand, or fingers. Get help right away if: Your arm, hand, or fingers: Tingle. Are numb. Are swollen. Are painful. Turn white or [place]. Summary Shoulder pain can be caused by many things. These include injury, moving the shoulder in the same away again and again, and joint pain. Watch for changes in your symptoms. Let your doctor know about them.  This condition may be treated with a sling, ice, and pain medicine. Contact your doctor if the pain gets worse or you have new pain. Get help right away if your arm,  hand, or fingers tingle or get numb, swollen, or painful. Keep all follow-up visits as told by your doctor. This is important. This information is not intended to replace advice given to you by your health care provider. Make sure you discuss any questions you have with your health care provider. Document Revised: 12/10/2020 Document Reviewed: 12/10/2020 Elsevier Patient Education  2022 [place], MD [facility]

## 2021-04-27 NOTE — Patient Instructions (Signed)
Shoulder Pain Many things can cause shoulder pain, including: An injury. Moving the shoulder in the same way again and again (overuse). Joint pain (arthritis). Pain can come from: Swelling and irritation (inflammation) of any part of the shoulder. An injury to the shoulder joint. An injury to: Tissues that connect muscle to bone (tendons). Tissues that connect bones to each other (ligaments). Bones. Follow these instructions at home: Watch for changes in your symptoms. Let your doctor know about them. Follow these instructions to help with your pain. If you have a sling: Wear the sling as told by your doctor. Remove it only as told by your doctor. Loosen the sling if your fingers: Tingle. Become numb. Turn cold and [place]. Keep the sling clean. If the sling is not waterproof: Do not let it get wet. Take the sling off when you shower or bathe. Managing pain, stiffness, and swelling  If told, put ice on the painful area: Put ice in a plastic bag. Place a towel between your skin and the bag. Leave the ice on for 20 minutes, 2-3 times a day. Stop putting ice on if it does not help with the pain. Squeeze a soft ball or a foam pad as much as possible. This prevents swelling in the shoulder. It also helps to strengthen the arm. General instructions Take over-the-counter and prescription medicines only as told by your doctor. Keep all follow-up visits as told by your doctor. This is important. Contact a doctor if: Your pain gets worse. Medicine does not help your pain. You have new pain in your arm, hand, or fingers. Get help right away if: Your arm, hand, or fingers: Tingle. Are numb. Are swollen. Are painful. Turn white or [place]. Summary Shoulder pain can be caused by many things. These include injury, moving the shoulder in the same away again and again, and joint pain. Watch for changes in your symptoms. Let your doctor know about them. This condition may be treated with a  sling, ice, and pain medicine. Contact your doctor if the pain gets worse or you have new pain. Get help right away if your arm, hand, or fingers tingle or get numb, swollen, or painful. Keep all follow-up visits as told by your doctor. This is important. This information is not intended to replace advice given to you by your health care provider. Make sure you discuss any questions you have with your health care provider. Document Revised: 12/10/2020 Document Reviewed: 12/10/2020 Elsevier Patient Education  2022 [employer].

## 2021-05-04 NOTE — Progress Notes (Signed)
Brenda [name] D.[place] Phone: [phone]   Assessment and Plan:     1. Acute pain of left shoulder 2. Primary osteoarthritis of left shoulder -Chronic with exacerbation, initial sports medicine visit - Acute flare of left shoulder pain causing reduced ROM, likely due to underlying mild osteoarthritis based on HPI, physical exam, x-ray - Reviewed x-ray with patient in clinic.  My interpretation: No acute fracture or dislocation.  Mild cortical changes at humeral head with glenoid spurring - Patient elected for CSI subacromial.  Tolerated well per note below - Start HEP.  Handout provided  Procedure: Subacromial Injection Side: Left  Risks explained and consent was given verbally. The site was cleaned with alcohol prep. A steroid injection was performed from posterior approach using 54mL of 1% lidocaine without epinephrine and 85mL of kenalog 40mg /ml. This was well tolerated and resulted in symptomatic relief.  Needle was removed, hemostasis achieved, and post injection instructions were explained.   Pt was advised to call or return to clinic if these symptoms worsen or fail to improve as anticipated.    Pertinent previous records reviewed include x-ray left shoulder 04/27/2021   Follow Up: As needed if no improvement or worsening of symptoms.  Could consider intra-articular shoulder CSI versus advanced imaging versus PT versus ultrasound   Subjective:   I, Brenda [name], am serving as a Education administrator for Doctor Glennon Mac  Chief Complaint: left shoulder pain  HPI:   05/05/2021 Patient is a 71 year old female complaining of left shoulder pain . Patient states  left shoulder pain for about a month .  Denies injury.Pain is sharp and worse with movement does not radiate down or up when she lays on it she feels the pain cant carry anything on that side .  No radiation or associated symptoms. Unable to lift left  arm has a trip coming up in a week . Has been taking pain from dr sagardia   Relevant Historical Information: None pertinent  Additional pertinent review of systems negative.   Current Outpatient Medications:    acetaminophen (TYLENOL) 500 MG tablet, Take 500 mg by mouth every 6 (six) hours as needed (for pain.)., Disp: , Rfl:    atorvastatin (LIPITOR) 10 MG tablet, Take 1 tablet (10 mg total) by mouth daily., Disp: 90 tablet, Rfl: 3   clobetasol cream (TEMOVATE) 0.05 %, APPLY SMALL AMOUNT TO AFFECTED AREA TWICE A DAY, Disp: , Rfl:    diclofenac (VOLTAREN) 75 MG EC tablet, Take 1 tablet (75 mg total) by mouth 2 (two) times daily for 10 days., Disp: 20 tablet, Rfl: 1   gabapentin (NEURONTIN) 100 MG capsule, Take 2 capsules (200 mg total) by mouth at bedtime., Disp: 60 capsule, Rfl: 3   hydrOXYzine (ATARAX/VISTARIL) 25 MG tablet, Take 1 tablet (25 mg total) by mouth every 8 (eight) hours as needed (anxiety/itching.)., Disp: 90 tablet, Rfl: 3   metroNIDAZOLE (METROCREAM) 0.75 % cream, Apply 1 application topically in the morning and at bedtime. , Disp: , Rfl: 2   omeprazole (PRILOSEC) 20 MG capsule, Take 20 mg by mouth daily., Disp: , Rfl:    Vitamin D, Ergocalciferol, (DRISDOL) 1.25 MG (50000 UNIT) CAPS capsule, Take 1 capsule (50,000 Units total) by mouth every 7 (seven) days., Disp: 12 capsule, Rfl: 0   Objective:     Vitals:   05/05/21 1430  BP: 132/80  Pulse: 80  SpO2: 98%  Weight: 169 lb (76.7 kg)  Height: 5\' 6"  (1.676 m)      Body mass index is 27.28 kg/m.    Physical Exam:    Gen: Appears well, nad, nontoxic and pleasant Neuro:sensation intact, strength is 5/5 with df/pf/inv/ev, muscle tone wnl Skin: no suspicious lesion or defmority Psych: A&O, appropriate mood and affect  Left shoulder: no deformity, swelling or muscle wasting No scapular winging FF 100, abd 90, int 20, ext 70 NTTP over the [place], clavicle, ac, coracoid, biceps groove, humerus, deltoid, trapezius,  cervical spine Equivocal special testing with each test causing discomfort Neg ant drawer, sulcus sign, apprehension Negative Spurling's test bilat FROM of neck    Electronically signed by:  Brenda [name] D.[facility] 2:52 PM 05/05/21

## 2021-05-05 ENCOUNTER — Ambulatory Visit (INDEPENDENT_AMBULATORY_CARE_PROVIDER_SITE_OTHER): Payer: Medicare PPO | Admitting: Sports Medicine

## 2021-05-05 ENCOUNTER — Other Ambulatory Visit: Payer: Self-pay

## 2021-05-05 VITALS — BP 132/80 | HR 80 | Ht 66.0 in | Wt 169.0 lb

## 2021-05-05 DIAGNOSIS — M25512 Pain in left shoulder: Secondary | ICD-10-CM

## 2021-05-05 DIAGNOSIS — M19012 Primary osteoarthritis, left shoulder: Secondary | ICD-10-CM

## 2021-05-05 NOTE — Patient Instructions (Addendum)
Good to see you  Shoulder HEP  As needed follow up  

## 2021-07-08 DIAGNOSIS — Z20822 Contact with and (suspected) exposure to covid-19: Secondary | ICD-10-CM | POA: Diagnosis not present

## 2021-07-08 DIAGNOSIS — Z20818 Contact with and (suspected) exposure to other bacterial communicable diseases: Secondary | ICD-10-CM | POA: Diagnosis not present

## 2021-07-08 DIAGNOSIS — J40 Bronchitis, not specified as acute or chronic: Secondary | ICD-10-CM | POA: Diagnosis not present

## 2021-07-12 ENCOUNTER — Ambulatory Visit (INDEPENDENT_AMBULATORY_CARE_PROVIDER_SITE_OTHER): Payer: Medicare HMO | Admitting: Internal Medicine

## 2021-07-12 ENCOUNTER — Encounter: Payer: Self-pay | Admitting: Internal Medicine

## 2021-07-12 DIAGNOSIS — J069 Acute upper respiratory infection, unspecified: Secondary | ICD-10-CM | POA: Diagnosis not present

## 2021-07-12 MED ORDER — FLUTICASONE PROPIONATE 50 MCG/ACT NA SUSP: 2.0000 | Freq: Every day | NASAL | 6 refills | Status: DC

## 2021-07-12 MED ORDER — HYDROCODONE BIT-HOMATROP MBR 5-1.5 MG/5ML PO SOLN: 5.0000 mL | Freq: Three times a day (TID) | ORAL | 0 refills | Status: DC | PRN

## 2021-07-12 NOTE — Assessment & Plan Note (Signed)
Suspect she caught while on travels. Likely viral and less than 1 week onset. Advised not to take antibiotics prescribed about 1 day after symptoms. Lungs clear. Rx flonase and hydrocodone cough syrup for cough.  ?

## 2021-07-12 NOTE — Patient Instructions (Signed)
We have sent in the flonase to use the next 1-2 weeks for the drainage 2 sprays in each nostril once a day. ? ?We have also sent in the cough medicine to use. ?

## 2021-07-12 NOTE — Progress Notes (Signed)
? ?  Subjective:  ? ?Patient ID: Brenda [name], female    DOB: 10/27/1950, 71 y.o.   MRN: [id number] ? ?HPI ?The patient is a 71 YO female coming in for illness after trip. ? ?Review of Systems  ?Constitutional:  Negative for activity change, appetite change, chills, fatigue, fever and unexpected weight change.  ?HENT:  Positive for congestion, postnasal drip, rhinorrhea and sinus pressure. Negative for ear discharge, ear pain, sinus pain, sneezing, sore throat, tinnitus, trouble swallowing and voice change.   ?Eyes: Negative.   ?Respiratory:  Positive for cough. Negative for chest tightness, shortness of breath and wheezing.   ?Cardiovascular: Negative.   ?Gastrointestinal: Negative.   ?Musculoskeletal:  Positive for myalgias.  ?Neurological: Negative.   ? ?Objective:  ?Physical Exam ?Constitutional:   ?   Appearance: She is well-developed.  ?HENT:  ?   Head: Normocephalic and atraumatic.  ?   Comments: Oropharynx with redness and clear drainage, nose with swollen turbinates, TMs normal bilaterally.  ?Neck:  ?   Thyroid: No thyromegaly.  ?Cardiovascular:  ?   Rate and Rhythm: Normal rate and regular rhythm.  ?Pulmonary:  ?   Effort: Pulmonary effort is normal. No respiratory distress.  ?   Breath sounds: Normal breath sounds. No wheezing or rales.  ?Abdominal:  ?   General: Bowel sounds are normal. There is no distension.  ?   Palpations: Abdomen is soft.  ?   Tenderness: There is no abdominal tenderness. There is no rebound.  ?Musculoskeletal:     ?   General: No tenderness.  ?   Cervical back: Normal range of motion.  ?Lymphadenopathy:  ?   Cervical: No cervical adenopathy.  ?Skin: ?   General: Skin is warm and dry.  ?Neurological:  ?   Mental Status: She is alert and oriented to person, place, and time.  ?   Coordination: Coordination normal.  ? ? ?Vitals:  ? 07/12/21 0811  ?BP: 116/70  ?Pulse: 95  ?Resp: 18  ?Temp: 98 ?F (36.7 ?C)  ?TempSrc: Oral  ?SpO2: 99%  ?Weight: 164 lb 6.4 oz (74.6 kg)  ?Height: '5\' 6"'$  (1.676  m)  ? ? ?This visit occurred during the SARS-CoV-2 public health emergency.  Safety protocols were in place, including screening questions prior to the visit, additional usage of staff PPE, and extensive cleaning of exam room while observing appropriate contact time as indicated for disinfecting solutions.  ? ?Assessment & Plan:  ? ?

## 2021-07-18 DIAGNOSIS — Z0100 Encounter for examination of eyes and vision without abnormal findings: Secondary | ICD-10-CM | POA: Diagnosis not present

## 2021-08-01 ENCOUNTER — Encounter: Payer: Self-pay | Admitting: Emergency Medicine

## 2021-08-01 ENCOUNTER — Ambulatory Visit (INDEPENDENT_AMBULATORY_CARE_PROVIDER_SITE_OTHER): Payer: Medicare HMO | Admitting: Emergency Medicine

## 2021-08-01 ENCOUNTER — Ambulatory Visit (INDEPENDENT_AMBULATORY_CARE_PROVIDER_SITE_OTHER): Payer: Medicare HMO

## 2021-08-01 VITALS — BP 118/72 | HR 94 | Temp 98.0°F | Ht 66.0 in | Wt 165.0 lb

## 2021-08-01 DIAGNOSIS — R051 Acute cough: Secondary | ICD-10-CM

## 2021-08-01 DIAGNOSIS — J4521 Mild intermittent asthma with (acute) exacerbation: Secondary | ICD-10-CM

## 2021-08-01 DIAGNOSIS — R059 Cough, unspecified: Secondary | ICD-10-CM | POA: Diagnosis not present

## 2021-08-01 DIAGNOSIS — J22 Unspecified acute lower respiratory infection: Secondary | ICD-10-CM | POA: Diagnosis not present

## 2021-08-01 LAB — CBC WITH DIFFERENTIAL/PLATELET
Basophils Absolute: 0.1 10*3/uL (ref 0.0–0.1)
Basophils Relative: 1.4 % (ref 0.0–3.0)
Eosinophils Absolute: 0.1 10*3/uL (ref 0.0–0.7)
Eosinophils Relative: 1.8 % (ref 0.0–5.0)
HCT: 40.4 % (ref 36.0–46.0)
Hemoglobin: 12.8 g/dL (ref 12.0–15.0)
Lymphocytes Relative: 29.3 % (ref 12.0–46.0)
Lymphs Abs: 2.3 10*3/uL (ref 0.7–4.0)
MCHC: 31.6 g/dL (ref 30.0–36.0)
MCV: 73.2 fl — ABNORMAL LOW (ref 78.0–100.0)
Monocytes Absolute: 0.7 10*3/uL (ref 0.1–1.0)
Monocytes Relative: 8.5 % (ref 3.0–12.0)
Neutro Abs: 4.7 10*3/uL (ref 1.4–7.7)
Neutrophils Relative %: 59 % (ref 43.0–77.0)
Platelets: 217 10*3/uL (ref 150.0–400.0)
RBC: 5.52 Mil/uL — ABNORMAL HIGH (ref 3.87–5.11)
RDW: 16.1 % — ABNORMAL HIGH (ref 11.5–15.5)
WBC: 8 10*3/uL (ref 4.0–10.5)

## 2021-08-01 MED ORDER — AZITHROMYCIN 250 MG PO TABS: ORAL_TABLET | ORAL | 0 refills | Status: DC

## 2021-08-01 MED ORDER — BENZONATATE 200 MG PO CAPS
200.0000 mg | ORAL_CAPSULE | Freq: Two times a day (BID) | ORAL | 0 refills | Status: DC | PRN
Start: 2021-08-01 — End: 2022-10-16

## 2021-08-01 MED ORDER — ALBUTEROL SULFATE HFA 108 (90 BASE) MCG/ACT IN AERS: 2.0000 | INHALATION_SPRAY | Freq: Two times a day (BID) | RESPIRATORY_TRACT | 3 refills | Status: DC

## 2021-08-01 NOTE — Progress Notes (Signed)
Brenda [name] ?71 y.o. ? ? ?Chief Complaint  ?Patient presents with  ? Follow-up  ? Cough  ?  Persistent cough  ? ? ?HISTORY OF PRESENT ILLNESS: ?This is a 71 y.o. female complaining of persisting coughing and intermittent wheezing for the past 4 weeks.  Recently traveled to [place] 4 weeks ago.  Symptoms started the day after she arrived back in the states.  Went to urgent care center and was started on Augmentin but developed abdominal cramping and diarrhea.  Seen here by Dr. Sharlet Salina on 07/12/2021 with diagnosis of viral URI.  Started on Flonase and cough syrup.  Symptoms persist.  Denies fever or chills.  Denies difficulty breathing.  Mostly complaining of persisting cough and occasional wheezing.  Denies chest pain. ?No other complaints or medical concerns today. ? ?Cough ?Associated symptoms include wheezing. Pertinent negatives include no chest pain, chills, fever, headaches, rash, sore throat or shortness of breath.  ? ? ?Prior to Admission medications   ?Medication Sig Start Date End Date Taking? Authorizing Provider  ?acetaminophen (TYLENOL) 500 MG tablet Take 500 mg by mouth every 6 (six) hours as needed (for pain.).   Yes [provider]  ?albuterol (VENTOLIN HFA) 108 (90 Base) MCG/ACT inhaler Inhale 2 puffs into the lungs 2 (two) times daily for 5 days. 08/01/21 08/06/21 Yes Rochanda Harpham, Ines Bloomer, MD  ?atorvastatin (LIPITOR) 10 MG tablet Take 1 tablet (10 mg total) by mouth daily. 02/22/21  Yes Horald Pollen, MD  ?azithromycin [facility]) 250 MG tablet Sig as indicated 08/01/21  Yes Krue Peterka, Ines Bloomer, MD  ?benzonatate (TESSALON) 200 MG capsule Take 1 capsule (200 mg total) by mouth 2 (two) times daily as needed for cough. 08/01/21  Yes Croy Drumwright, Ines Bloomer, MD  ?clobetasol cream (TEMOVATE) 0.05 % APPLY SMALL AMOUNT TO AFFECTED AREA TWICE A DAY 10/14/15  Yes [provider]  ?fluticasone (FLONASE) 50 MCG/ACT nasal spray Place 2 sprays into both nostrils daily. 07/12/21  Yes Hoyt Koch, MD  ?gabapentin (NEURONTIN) 100 MG capsule Take 2 capsules (200 mg total) by mouth at bedtime. 03/20/18  Yes Lyndal Pulley, DO  ?HYDROcodone bit-homatropine (HYCODAN) 5-1.5 MG/5ML syrup Take 5 mLs by mouth every 8 (eight) hours as needed for cough. 07/12/21  Yes Hoyt Koch, MD  ?hydrOXYzine (ATARAX/VISTARIL) 25 MG tablet Take 1 tablet (25 mg total) by mouth every 8 (eight) hours as needed (anxiety/itching.). 02/09/20  Yes Marrian Salvage, [place]  ?metroNIDAZOLE (METROCREAM) 0.75 % cream Apply 1 application topically in the morning and at bedtime.  01/03/18  Yes [provider]  ?omeprazole (PRILOSEC) 20 MG capsule Take 20 mg by mouth daily.   Yes [provider]  ?Vitamin D, Ergocalciferol, (DRISDOL) 1.25 MG (50000 UNIT) CAPS capsule Take 1 capsule (50,000 Units total) by mouth every 7 (seven) days. 05/13/19  Yes Lyndal Pulley, DO  ? ? ?Allergies  ?Allergen Reactions  ? Hydrocodone Other (See Comments)  ?  drowsiness  ? ? ?Patient Active Problem List  ? Diagnosis Date Noted  ? Primary osteoarthritis of left shoulder 04/27/2021  ? Spinal stenosis 02/22/2021  ? Alcohol abuse 11/30/2020  ? Bilateral hearing loss 11/30/2020  ? Insomnia 11/30/2020  ? Mucous polyp of cervix 11/30/2020  ? Degenerative arthritis of knee, bilateral 03/20/2018  ? Prediabetes 01/17/2018  ? Viral URI 08/30/2016  ? Arthralgia 11/17/2015  ? Left lumbar radiculopathy 10/25/2015  ? Menopausal state 08/22/2011  ? Hyperlipidemia 04/25/2010  ? HEARING LOSS 09/15/2009  ? ? ?Past  Medical History:  ?Diagnosis Date  ? ABDOMINAL/PELVIC SWELLING MASS/LUMP UNSPEC SITE 09/18/2007  ? ACUTE URIS OF UNSPECIFIED SITE 06/06/2007  ? Allergy   ? Anemia   ? Anxiety   ? no meds  ? Arthritis of hip   ? 08/13/2019: per patient "both hips"  ? Arthritis of knee, left   ? Arthritis of right knee   ? ASTHMATIC BRONCHITIS, ACUTE 05/11/2008  ? DM 09/18/2007  ? borderline -diet controlled- no meds  ? GERD (gastroesophageal reflux disease)    ? Glucose intolerance (impaired glucose tolerance)   ? HEARING LOSS 09/15/2009  ?  mild -no hearing aids  ? HYPERLIPIDEMIA 04/25/2010  ? OSTEOPENIA 04/20/2007  ? Palpitations 03/13/2008  ? Pneumonia   ? THYROID NODULE, RIGHT 05/11/2008  ? ? ?Past Surgical History:  ?Procedure Laterality Date  ? BREAST BIOPSY Left 05/22/2019  ? BREAST BIOPSY Left 06/2020  ? DILATION AND CURETTAGE, DIAGNOSTIC / THERAPEUTIC    ? HAMMER TOE SURGERY    ? RIGHT  ? HERNIA REPAIR  1992  ? KNEE SURGERY  1991  ? Left  ? Left Knee cyst  1985  ? Lymph Node Removal    ? RADIOACTIVE SEED GUIDED EXCISIONAL BREAST BIOPSY Left 08/19/2019  ? Procedure: LEFT BREAST RADIOACTIVE SEED GUIDED EXCISIONAL BREAST BIOPSY;  Surgeon: Stark Klein, MD;  Location: [facility];  Service: General;  Laterality: Left;  ? ? ?Social History  ? ?Socioeconomic History  ? Marital status: Married  ?  Spouse name: Not on file  ? Number of children: Not on file  ? Years of education: Not on file  ? Highest education level: Not on file  ?Occupational History  ? Occupation: [employer]  ?  Employer: [employer]  ?Tobacco Use  ? Smoking status: Never  ? Smokeless tobacco: Never  ?Vaping Use  ? Vaping Use: Never used  ?Substance and Sexual Activity  ? Alcohol use: Yes  ?  Alcohol/week: 14.0 standard drinks  ?  Types: 14 Glasses of wine per week  ? Drug use: No  ? Sexual activity: Yes  ?  Birth control/protection: Post-menopausal  ?Other Topics Concern  ? Not on file  ?Social History Narrative  ? Not on file  ? ?Social Determinants of Health  ? ?Financial Resource Strain: Not on file  ?Food Insecurity: Not on file  ?Transportation Needs: Not on file  ?Physical Activity: Not on file  ?Stress: Not on file  ?Social Connections: Not on file  ?Intimate Partner Violence: Not on file  ? ? ?Family History  ?Problem Relation Age of Onset  ? Cancer Mother   ?     Breast Cancer  ? Breast cancer Mother   ? Cancer Father   ?     "Stomach" Cancer  ? Stomach cancer Father   ? Cancer  Sister   ?     Colon Cancer  ? Breast cancer Sister   ? Kidney disease Brother   ?     Kidney Transplant  ? Colon cancer Neg Hx   ? Esophageal cancer Neg Hx   ? Rectal cancer Neg Hx   ? ? ? ?Review of Systems  ?Constitutional: Negative.  Negative for chills and fever.  ?HENT: Negative.  Negative for congestion and sore throat.   ?Respiratory:  Positive for cough, sputum production and wheezing. Negative for shortness of breath.   ?Cardiovascular:  Negative for chest pain and palpitations.  ?Gastrointestinal: Negative.  Negative for abdominal pain, nausea and vomiting.  ?  Genitourinary: Negative.   ?Musculoskeletal: Negative.   ?Skin: Negative.  Negative for rash.  ?Neurological: Negative.  Negative for dizziness and headaches.  ?All other systems reviewed and are negative. ? ?Today's Vitals  ? 08/01/21 1545  ?BP: 118/72  ?Pulse: 94  ?Temp: 98 ?F (36.7 ?C)  ?SpO2: 95%  ?Weight: 165 lb (74.8 kg)  ?Height: '5\' 6"'$  (1.676 m)  ? ?Body mass index is 26.63 kg/m?. ? ?Physical Exam ?Vitals reviewed.  ?Constitutional:   ?   Appearance: Normal appearance.  ?HENT:  ?   Head: Normocephalic.  ?   Mouth/Throat:  ?   Mouth: Mucous membranes are moist.  ?   Pharynx: Oropharynx is clear.  ?Eyes:  ?   Extraocular Movements: Extraocular movements intact.  ?   Conjunctiva/sclera: Conjunctivae normal.  ?   Pupils: Pupils are equal, round, and reactive to light.  ?Cardiovascular:  ?   Rate and Rhythm: Normal rate and regular rhythm.  ?   Pulses: Normal pulses.  ?   Heart sounds: Normal heart sounds.  ?Pulmonary:  ?   Effort: Pulmonary effort is normal.  ?   Breath sounds: Normal breath sounds.  ?Musculoskeletal:  ?   Cervical back: No tenderness.  ?Lymphadenopathy:  ?   Cervical: No cervical adenopathy.  ?Skin: ?   General: Skin is warm and dry.  ?   Capillary Refill: Capillary refill takes less than 2 seconds.  ?Neurological:  ?   General: No focal deficit present.  ?   Mental Status: She is alert and oriented to person, place, and time.   ?Psychiatric:     ?   Mood and Affect: Mood normal.     ?   Behavior: Behavior normal.  ? ?DG Chest 2 View ? ?Result Date: 08/01/2021 ?CLINICAL DATA:  Persistent cough EXAM: CHEST - 2 VIEW COMPARISON:  05/08

## 2021-08-01 NOTE — Patient Instructions (Signed)

## 2021-08-02 LAB — COMPREHENSIVE METABOLIC PANEL
ALT: 20 U/L (ref 0–35)
AST: 17 U/L (ref 0–37)
Albumin: 4.4 g/dL (ref 3.5–5.2)
Alkaline Phosphatase: 55 U/L (ref 39–117)
BUN: 22 mg/dL (ref 6–23)
CO2: 26 mEq/L (ref 19–32)
Calcium: 9.2 mg/dL (ref 8.4–10.5)
Chloride: 105 mEq/L (ref 96–112)
Creatinine, Ser: 0.65 mg/dL (ref 0.40–1.20)
GFR: 90.39 mL/min (ref >60.00)
Glucose, Bld: 89 mg/dL (ref 70–99)
Potassium: 3.4 mEq/L — ABNORMAL LOW (ref 3.5–5.1)
Sodium: 138 mEq/L (ref 135–145)
Total Bilirubin: 0.5 mg/dL (ref 0.2–1.2)
Total Protein: 7.2 g/dL (ref 6.0–8.3)

## 2021-08-19 DIAGNOSIS — L245 Irritant contact dermatitis due to other chemical products: Secondary | ICD-10-CM | POA: Diagnosis not present

## 2021-09-14 ENCOUNTER — Encounter: Payer: Self-pay | Admitting: Emergency Medicine

## 2021-09-14 ENCOUNTER — Ambulatory Visit (INDEPENDENT_AMBULATORY_CARE_PROVIDER_SITE_OTHER): Payer: Medicare HMO | Admitting: Emergency Medicine

## 2021-09-14 VITALS — BP 126/84 | HR 95 | Temp 98.1°F | Ht 66.0 in | Wt 165.2 lb

## 2021-09-14 DIAGNOSIS — Z9181 History of falling: Secondary | ICD-10-CM | POA: Diagnosis not present

## 2021-09-14 DIAGNOSIS — Z79899 Other long term (current) drug therapy: Secondary | ICD-10-CM | POA: Diagnosis not present

## 2021-09-14 DIAGNOSIS — G629 Polyneuropathy, unspecified: Secondary | ICD-10-CM | POA: Diagnosis not present

## 2021-09-14 DIAGNOSIS — Z888 Allergy status to other drugs, medicaments and biological substances status: Secondary | ICD-10-CM | POA: Diagnosis not present

## 2021-09-14 DIAGNOSIS — R21 Rash and other nonspecific skin eruption: Secondary | ICD-10-CM | POA: Diagnosis not present

## 2021-09-14 DIAGNOSIS — L299 Pruritus, unspecified: Secondary | ICD-10-CM | POA: Diagnosis not present

## 2021-09-14 DIAGNOSIS — L239 Allergic contact dermatitis, unspecified cause: Secondary | ICD-10-CM | POA: Insufficient documentation

## 2021-09-14 DIAGNOSIS — L209 Atopic dermatitis, unspecified: Secondary | ICD-10-CM | POA: Diagnosis not present

## 2021-09-14 DIAGNOSIS — E785 Hyperlipidemia, unspecified: Secondary | ICD-10-CM | POA: Diagnosis not present

## 2021-09-14 DIAGNOSIS — R69 Illness, unspecified: Secondary | ICD-10-CM | POA: Diagnosis not present

## 2021-09-14 DIAGNOSIS — Z809 Family history of malignant neoplasm, unspecified: Secondary | ICD-10-CM | POA: Diagnosis not present

## 2021-09-14 DIAGNOSIS — K219 Gastro-esophageal reflux disease without esophagitis: Secondary | ICD-10-CM | POA: Diagnosis not present

## 2021-09-14 DIAGNOSIS — Z7952 Long term (current) use of systemic steroids: Secondary | ICD-10-CM | POA: Diagnosis not present

## 2021-09-14 DIAGNOSIS — J45909 Unspecified asthma, uncomplicated: Secondary | ICD-10-CM | POA: Diagnosis not present

## 2021-09-14 MED ORDER — METHYLPREDNISOLONE 4 MG PO TBPK: ORAL_TABLET | ORAL | 1 refills | Status: DC

## 2021-09-14 MED ORDER — CETIRIZINE HCL 10 MG PO TABS: 10.0000 mg | ORAL_TABLET | Freq: Every day | ORAL | 0 refills | Status: DC

## 2021-09-14 MED ORDER — FAMOTIDINE 40 MG PO TABS: 40.0000 mg | ORAL_TABLET | Freq: Every day | ORAL | 0 refills | Status: DC

## 2021-09-14 MED ORDER — METHYLPREDNISOLONE ACETATE 80 MG/ML IJ SUSP
80.0000 mg | Freq: Once | INTRAMUSCULAR | Status: AC
  Administered 2021-09-14: 80 mg via INTRAMUSCULAR

## 2021-09-14 NOTE — Progress Notes (Signed)
Brenda [name] 71 y.o.   Chief Complaint  Patient presents with   Rash    Neck, chest, and arm, itchy, red     HISTORY OF PRESENT ILLNESS: This is a 71 y.o. female complaining of itchy rash for the last 3 to 4 weeks.  At some point it was blisterous.  Was able to see dermatologist couple weeks ago who advised to use hydrocortisone cream.  Still having generalized itching.  Unable to sleep at night. Unspecified trigger. Had recent dental work done. No new medications. No other complaints or medical concerns today.  Rash Pertinent negatives include no congestion, cough, diarrhea, fever, shortness of breath, sore throat or vomiting.    Prior to Admission medications   Medication Sig Start Date End Date Taking? Authorizing Provider  acetaminophen (TYLENOL) 500 MG tablet Take 500 mg by mouth every 6 (six) hours as needed (for pain.).   Yes [provider]  atorvastatin (LIPITOR) 10 MG tablet Take 1 tablet (10 mg total) by mouth daily. 02/22/21  Yes Mai Longnecker, Ines Bloomer, MD  azithromycin [facility]) 250 MG tablet Sig as indicated 08/01/21  Yes Phat Dalton, Ines Bloomer, MD  benzonatate (TESSALON) 200 MG capsule Take 1 capsule (200 mg total) by mouth 2 (two) times daily as needed for cough. 08/01/21  Yes Willia Lampert, Ines Bloomer, MD  clobetasol cream (TEMOVATE) 0.05 % APPLY SMALL AMOUNT TO AFFECTED AREA TWICE A DAY 10/14/15  Yes [provider]  fluticasone (FLONASE) 50 MCG/ACT nasal spray Place 2 sprays into both nostrils daily. 07/12/21  Yes Hoyt Koch, MD  gabapentin (NEURONTIN) 100 MG capsule Take 2 capsules (200 mg total) by mouth at bedtime. 03/20/18  Yes Lyndal Pulley, DO  HYDROcodone bit-homatropine (HYCODAN) 5-1.5 MG/5ML syrup Take 5 mLs by mouth every 8 (eight) hours as needed for cough. 07/12/21  Yes Hoyt Koch, MD  hydrOXYzine (ATARAX/VISTARIL) 25 MG tablet Take 1 tablet (25 mg total) by mouth every 8 (eight) hours as needed (anxiety/itching.). 02/09/20   Yes Marrian Salvage, FNP  metroNIDAZOLE (METROCREAM) 0.75 % cream Apply 1 application topically in the morning and at bedtime.  01/03/18  Yes [provider]  omeprazole (PRILOSEC) 20 MG capsule Take 20 mg by mouth daily.   Yes [provider]  Vitamin D, Ergocalciferol, (DRISDOL) 1.25 MG (50000 UNIT) CAPS capsule Take 1 capsule (50,000 Units total) by mouth every 7 (seven) days. 05/13/19  Yes Lyndal Pulley, DO  albuterol (VENTOLIN HFA) 108 (90 Base) MCG/ACT inhaler Inhale 2 puffs into the lungs 2 (two) times daily for 5 days. 08/01/21 08/06/21  Horald Pollen, MD  hydrocortisone 2.5 % cream Apply topically. 08/19/21   [provider]    Allergies  Allergen Reactions   Hydrocodone Other (See Comments)    drowsiness    Patient Active Problem List   Diagnosis Date Noted   Primary osteoarthritis of left shoulder 04/27/2021   Spinal stenosis 02/22/2021   Alcohol abuse 11/30/2020   Bilateral hearing loss 11/30/2020   Insomnia 11/30/2020   Mucous polyp of cervix 11/30/2020   Degenerative arthritis of knee, bilateral 03/20/2018   Prediabetes 01/17/2018   Viral URI 08/30/2016   Arthralgia 11/17/2015   Left lumbar radiculopathy 10/25/2015   Menopausal state 08/22/2011   Hyperlipidemia 04/25/2010   HEARING LOSS 09/15/2009    Past Medical History:  Diagnosis Date   ABDOMINAL/PELVIC SWELLING MASS/LUMP UNSPEC SITE 09/18/2007   ACUTE URIS OF UNSPECIFIED SITE 06/06/2007   Allergy    Anemia  Anxiety    no meds   Arthritis of hip    08/13/2019: per patient "both hips"   Arthritis of knee, left    Arthritis of right knee    ASTHMATIC BRONCHITIS, ACUTE 05/11/2008   DM 09/18/2007   borderline -diet controlled- no meds   GERD (gastroesophageal reflux disease)    Glucose intolerance (impaired glucose tolerance)    HEARING LOSS 09/15/2009    mild -no hearing aids   HYPERLIPIDEMIA 04/25/2010   OSTEOPENIA 04/20/2007   Palpitations 03/13/2008   Pneumonia     THYROID NODULE, RIGHT 05/11/2008    Past Surgical History:  Procedure Laterality Date   BREAST BIOPSY Left 05/22/2019   BREAST BIOPSY Left 06/2020   DILATION AND CURETTAGE, DIAGNOSTIC / THERAPEUTIC     HAMMER TOE SURGERY     RIGHT   HERNIA REPAIR  1992   KNEE SURGERY  1991   Left   Left Knee cyst  1985   Lymph Node Removal     RADIOACTIVE SEED GUIDED EXCISIONAL BREAST BIOPSY Left 08/19/2019   Procedure: LEFT BREAST RADIOACTIVE SEED GUIDED EXCISIONAL BREAST BIOPSY;  Surgeon: Stark Klein, MD;  Location: [facility];  Service: General;  Laterality: Left;    Social History   Socioeconomic History   Marital status: Married    Spouse name: Not on file   Number of children: Not on file   Years of education: Not on file   Highest education level: Not on file  Occupational History   Occupation: Nurse, adult: [employer]  Tobacco Use   Smoking status: Never   Smokeless tobacco: Never  Vaping Use   Vaping Use: Never used  Substance and Sexual Activity   Alcohol use: Yes    Alcohol/week: 14.0 standard drinks    Types: 14 Glasses of wine per week   Drug use: No   Sexual activity: Yes    Birth control/protection: Post-menopausal  Other Topics Concern   Not on file  Social History Narrative   Not on file   Social Determinants of Health   Financial Resource Strain: Not on file  Food Insecurity: Not on file  Transportation Needs: Not on file  Physical Activity: Not on file  Stress: Not on file  Social Connections: Not on file  Intimate Partner Violence: Not on file    Family History  Problem Relation Age of Onset   Cancer Mother        Breast Cancer   Breast cancer Mother    Cancer Father        "Stomach" Cancer   Stomach cancer Father    Cancer Sister        Colon Cancer   Breast cancer Sister    Kidney disease Brother        Kidney Transplant   Colon cancer Neg Hx    Esophageal cancer Neg Hx    Rectal cancer Neg Hx      Review of  Systems  Constitutional: Negative.  Negative for chills and fever.  HENT:  Negative for congestion and sore throat.   Respiratory: Negative.  Negative for cough and shortness of breath.   Cardiovascular: Negative.  Negative for chest pain and palpitations.  Gastrointestinal:  Negative for abdominal pain, diarrhea, nausea and vomiting.  Genitourinary: Negative.  Negative for dysuria and hematuria.  Musculoskeletal: Negative.   Skin:  Positive for itching and rash.  Neurological:  Negative for dizziness and headaches.  All other systems reviewed and  are negative.  Today's Vitals   09/14/21 1054  BP: 126/84  Pulse: 95  Temp: 98.1 F (36.7 C)  TempSrc: Oral  SpO2: 92%  Weight: 165 lb 4 oz (75 kg)  Height: '5\' 6"'$  (1.676 m)   Body mass index is 26.67 kg/m.  Physical Exam Vitals reviewed.  Constitutional:      Appearance: Normal appearance.  HENT:     Head: Normocephalic.  Eyes:     Extraocular Movements: Extraocular movements intact.     Conjunctiva/sclera: Conjunctivae normal.     Pupils: Pupils are equal, round, and reactive to light.  Cardiovascular:     Rate and Rhythm: Normal rate and regular rhythm.     Pulses: Normal pulses.     Heart sounds: Normal heart sounds.  Pulmonary:     Effort: Pulmonary effort is normal.     Breath sounds: Normal breath sounds.  Musculoskeletal:     Cervical back: No tenderness.  Lymphadenopathy:     Cervical: No cervical adenopathy.  Skin:    General: Skin is warm and dry.     Capillary Refill: Capillary refill takes less than 2 seconds.     Findings: Rash present.     Comments: Generalized erythematous rash.  No blisters.  Neurological:     General: No focal deficit present.     Mental Status: She is alert and oriented to person, place, and time.  Psychiatric:        Mood and Affect: Mood normal.        Behavior: Behavior normal.     ASSESSMENT & PLAN: Problem List Items Addressed This Visit       Musculoskeletal and  Integument   Rash and nonspecific skin eruption - Primary    Most likely allergic.  Doubt infectious rash. Affecting quality of life.  Needs corticosteroid therapy at this point. Depo-Medrol in the office.  Start Medrol Dosepak tomorrow. Start Zyrtec 10 mg daily and Pepcid 40 mg daily.        Relevant Medications   methylPREDNISolone (MEDROL DOSEPAK) 4 MG TBPK tablet   Other Relevant Orders   Ambulatory referral to Allergy   Allergic dermatitis    Depo-Medrol, Pepcid, and Zyrtec as prescribed Referral placed to allergist.       Relevant Medications   methylPREDNISolone (MEDROL DOSEPAK) 4 MG TBPK tablet   famotidine (PEPCID) 40 MG tablet   cetirizine (ZYRTEC) 10 MG tablet   Other Relevant Orders   Ambulatory referral to Allergy   Patient Instructions  Contact Dermatitis Dermatitis is redness, soreness, and swelling (inflammation) of the skin. Contact dermatitis is a reaction to something that touches the skin. There are two types of contact dermatitis: Irritant contact dermatitis. This happens when something bothers (irritates) your skin, like soap. Allergic contact dermatitis. This is caused when you are exposed to something that you are allergic to, such as poison ivy. What are the causes? Common causes of irritant contact dermatitis include: Makeup. Soaps. Detergents. Bleaches. Acids. Metals, such as nickel. Common causes of allergic contact dermatitis include: Plants. Chemicals. Jewelry. Latex. Medicines. Preservatives in products, such as clothing. What increases the risk? Having a job that exposes you to things that bother your skin. Having asthma or eczema. What are the signs or symptoms? Symptoms may happen anywhere the irritant has touched your skin. Symptoms include: Dry or flaky skin. Redness. Cracks. Itching. Pain or a burning feeling. Blisters. Blood or clear fluid draining from skin cracks. With allergic contact dermatitis, swelling may occur.  This may happen in places such as the eyelids, mouth, or genitals. How is this treated? This condition is treated by checking for the cause of the reaction and protecting your skin. Treatment may also include: Steroid creams, ointments, or medicines. Antibiotic medicines or other ointments, if you have a skin infection. Lotion or medicines to help with itching. A bandage (dressing). Follow these instructions at home: Skin care Moisturize your skin as needed. Put cool cloths on your skin. Put a baking soda paste on your skin. Stir water into baking soda until it looks like a paste. Do not scratch your skin. Avoid having things rub up against your skin. Avoid the use of soaps, perfumes, and dyes. Medicines Take or apply over-the-counter and prescription medicines only as told by your doctor. If you were prescribed an antibiotic medicine, take or apply it as told by your doctor. Do not stop using it even if your condition starts to get better. Bathing Take a bath with: Epsom salts. Baking soda. Colloidal oatmeal. Bathe less often. Bathe in warm water. Avoid using hot water. Bandage care If you were given a bandage, change it as told by your doctor. Wash your hands with soap and water before and after you change your bandage. If soap and water are not available, use hand sanitizer. General instructions Avoid the things that caused your reaction. If you do not know what caused it, keep a journal. Write down: What you eat. What skin products you use. What you drink. What you wear in the area that has symptoms. This includes jewelry. Check the affected areas every day for signs of infection. Check for: More redness, swelling, or pain. More fluid or blood. Warmth. Pus or a bad smell. Keep all follow-up visits as told by your doctor. This is important. Contact a doctor if: You do not get better with treatment. Your condition gets worse. You have signs of infection, such as: More  swelling. Tenderness. More redness. Soreness. Warmth. You have a fever. You have new symptoms. Get help right away if: You have a very bad headache. You have neck pain. Your neck is stiff. You throw up (vomit). You feel very sleepy. You see red streaks coming from the area. Your bone or joint near the area hurts after the skin has healed. The area turns darker. You have trouble breathing. Summary Dermatitis is redness, soreness, and swelling of the skin. Symptoms may occur where the irritant has touched you. Treatment may include medicines and skin care. If you do not know what caused your reaction, keep a journal. Contact a doctor if your condition gets worse or you have signs of infection. This information is not intended to replace advice given to you by your health care provider. Make sure you discuss any questions you have with your health care provider. Document Revised: 01/10/2021 Document Reviewed: 01/10/2021 Elsevier Patient Education  [place], MD [facility]

## 2021-09-14 NOTE — Patient Instructions (Signed)
Contact Dermatitis Dermatitis is redness, soreness, and swelling (inflammation) of the skin. Contact dermatitis is a reaction to something that touches the skin. There are two types of contact dermatitis: Irritant contact dermatitis. This happens when something bothers (irritates) your skin, like soap. Allergic contact dermatitis. This is caused when you are exposed to something that you are allergic to, such as poison ivy. What are the causes? Common causes of irritant contact dermatitis include: Makeup. Soaps. Detergents. Bleaches. Acids. Metals, such as nickel. Common causes of allergic contact dermatitis include: Plants. Chemicals. Jewelry. Latex. Medicines. Preservatives in products, such as clothing. What increases the risk? Having a job that exposes you to things that bother your skin. Having asthma or eczema. What are the signs or symptoms? Symptoms may happen anywhere the irritant has touched your skin. Symptoms include: Dry or flaky skin. Redness. Cracks. Itching. Pain or a burning feeling. Blisters. Blood or clear fluid draining from skin cracks. With allergic contact dermatitis, swelling may occur. This may happen in places such as the eyelids, mouth, or genitals. How is this treated? This condition is treated by checking for the cause of the reaction and protecting your skin. Treatment may also include: Steroid creams, ointments, or medicines. Antibiotic medicines or other ointments, if you have a skin infection. Lotion or medicines to help with itching. A bandage (dressing). Follow these instructions at home: Skin care Moisturize your skin as needed. Put cool cloths on your skin. Put a baking soda paste on your skin. Stir water into baking soda until it looks like a paste. Do not scratch your skin. Avoid having things rub up against your skin. Avoid the use of soaps, perfumes, and dyes. Medicines Take or apply over-the-counter and prescription medicines  only as told by your doctor. If you were prescribed an antibiotic medicine, take or apply it as told by your doctor. Do not stop using it even if your condition starts to get better. Bathing Take a bath with: Epsom salts. Baking soda. Colloidal oatmeal. Bathe less often. Bathe in warm water. Avoid using hot water. Bandage care If you were given a bandage, change it as told by your doctor. Wash your hands with soap and water before and after you change your bandage. If soap and water are not available, use hand sanitizer. General instructions Avoid the things that caused your reaction. If you do not know what caused it, keep a journal. Write down: What you eat. What skin products you use. What you drink. What you wear in the area that has symptoms. This includes jewelry. Check the affected areas every day for signs of infection. Check for: More redness, swelling, or pain. More fluid or blood. Warmth. Pus or a bad smell. Keep all follow-up visits as told by your doctor. This is important. Contact a doctor if: You do not get better with treatment. Your condition gets worse. You have signs of infection, such as: More swelling. Tenderness. More redness. Soreness. Warmth. You have a fever. You have new symptoms. Get help right away if: You have a very bad headache. You have neck pain. Your neck is stiff. You throw up (vomit). You feel very sleepy. You see red streaks coming from the area. Your bone or joint near the area hurts after the skin has healed. The area turns darker. You have trouble breathing. Summary Dermatitis is redness, soreness, and swelling of the skin. Symptoms may occur where the irritant has touched you. Treatment may include medicines and skin care. If you do not   know what caused your reaction, keep a journal. Contact a doctor if your condition gets worse or you have signs of infection. This information is not intended to replace advice given to you by  your health care provider. Make sure you discuss any questions you have with your health care provider. Document Revised: 01/10/2021 Document Reviewed: 01/10/2021 Elsevier Patient Education  2023 Elsevier Inc.  

## 2021-09-14 NOTE — Assessment & Plan Note (Signed)
Most likely allergic.  Doubt infectious rash. Affecting quality of life.  Needs corticosteroid therapy at this point. Depo-Medrol in the office.  Start Medrol Dosepak tomorrow. Start Zyrtec 10 mg daily and Pepcid 40 mg daily.

## 2021-09-14 NOTE — Assessment & Plan Note (Signed)
Depo-Medrol, Pepcid, and Zyrtec as prescribed Referral placed to allergist.

## 2021-09-24 ENCOUNTER — Encounter: Payer: Self-pay | Admitting: *Deleted

## 2021-09-24 LAB — GLUCOSE, POCT (MANUAL RESULT ENTRY): POC Glucose: 125 mg/dl — AB (ref 70–99)

## 2021-11-17 ENCOUNTER — Ambulatory Visit (INDEPENDENT_AMBULATORY_CARE_PROVIDER_SITE_OTHER): Payer: Medicare HMO | Admitting: Emergency Medicine

## 2021-11-17 ENCOUNTER — Encounter: Payer: Self-pay | Admitting: Emergency Medicine

## 2021-11-17 VITALS — BP 120/76 | HR 64 | Temp 97.7°F | Ht 66.0 in | Wt 164.5 lb

## 2021-11-17 DIAGNOSIS — L239 Allergic contact dermatitis, unspecified cause: Secondary | ICD-10-CM

## 2021-11-17 MED ORDER — PREDNISONE 20 MG PO TABS: 40.0000 mg | ORAL_TABLET | Freq: Every day | ORAL | 0 refills | Status: AC

## 2021-11-17 MED ORDER — METHYLPREDNISOLONE ACETATE 80 MG/ML IJ SUSP
80.0000 mg | Freq: Once | INTRAMUSCULAR | Status: AC
  Administered 2021-11-17: 80 mg via INTRAMUSCULAR

## 2021-11-17 MED ORDER — METHYLPREDNISOLONE ACETATE 80 MG/ML IJ SUSP: 80.0000 mg | Freq: Once | INTRAMUSCULAR | Status: DC

## 2021-11-17 MED ORDER — FAMOTIDINE 40 MG PO TABS: 40.0000 mg | ORAL_TABLET | Freq: Every day | ORAL | 0 refills | Status: DC

## 2021-11-17 MED ORDER — CETIRIZINE HCL 10 MG PO TABS: 10.0000 mg | ORAL_TABLET | Freq: Every day | ORAL | 0 refills | Status: DC

## 2021-11-17 NOTE — Progress Notes (Signed)
Brenda [name] 71 y.o.   Chief Complaint  Patient presents with   Rash    Skin rash under her breast, noticed rash last week     HISTORY OF PRESENT ILLNESS: Acute problem visit today. This is a 71 y.o. female complaining of itchy rash around her neck and breasts for 1 week. Had similar episode last June. No other associated symptoms. No other complaints or medical concerns today.  Rash Pertinent negatives include no congestion, cough, fever, shortness of breath, sore throat or vomiting.     Prior to Admission medications   Medication Sig Start Date End Date Taking? Authorizing Provider  acetaminophen (TYLENOL) 500 MG tablet Take 500 mg by mouth every 6 (six) hours as needed (for pain.).   Yes [provider]  atorvastatin (LIPITOR) 10 MG tablet Take 1 tablet (10 mg total) by mouth daily. 02/22/21  Yes Benjaman Artman, Ines Bloomer, MD  azithromycin [facility]) 250 MG tablet Sig as indicated 08/01/21  Yes Greysyn Vanderberg, Ines Bloomer, MD  benzonatate (TESSALON) 200 MG capsule Take 1 capsule (200 mg total) by mouth 2 (two) times daily as needed for cough. 08/01/21  Yes Kleigh Hoelzer, Ines Bloomer, MD  clobetasol cream (TEMOVATE) 0.05 % APPLY SMALL AMOUNT TO AFFECTED AREA TWICE A DAY 10/14/15  Yes [provider]  fluticasone (FLONASE) 50 MCG/ACT nasal spray Place 2 sprays into both nostrils daily. 07/12/21  Yes Hoyt Koch, MD  gabapentin (NEURONTIN) 100 MG capsule Take 2 capsules (200 mg total) by mouth at bedtime. 03/20/18  Yes Lyndal Pulley, DO  HYDROcodone bit-homatropine (HYCODAN) 5-1.5 MG/5ML syrup Take 5 mLs by mouth every 8 (eight) hours as needed for cough. 07/12/21  Yes Hoyt Koch, MD  hydrocortisone 2.5 % cream Apply topically. 08/19/21  Yes [provider]  hydrOXYzine (ATARAX/VISTARIL) 25 MG tablet Take 1 tablet (25 mg total) by mouth every 8 (eight) hours as needed (anxiety/itching.). 02/09/20  Yes Marrian Salvage, FNP  methylPREDNISolone (MEDROL  DOSEPAK) 4 MG TBPK tablet Sig as indicated 09/14/21  Yes Leilany Digeronimo, Ines Bloomer, MD  metroNIDAZOLE (METROCREAM) 0.75 % cream Apply 1 application topically in the morning and at bedtime.  01/03/18  Yes [provider]  omeprazole (PRILOSEC) 20 MG capsule Take 20 mg by mouth daily.   Yes [provider]  predniSONE (DELTASONE) 20 MG tablet Take 2 tablets (40 mg total) by mouth daily with breakfast for 5 days. 11/17/21 11/22/21 Yes Ashleyanne Hemmingway, Ines Bloomer, MD  Vitamin D, Ergocalciferol, (DRISDOL) 1.25 MG (50000 UNIT) CAPS capsule Take 1 capsule (50,000 Units total) by mouth every 7 (seven) days. 05/13/19  Yes Lyndal Pulley, DO  albuterol (VENTOLIN HFA) 108 (90 Base) MCG/ACT inhaler Inhale 2 puffs into the lungs 2 (two) times daily for 5 days. 08/01/21 08/06/21  Horald Pollen, MD  cetirizine (ZYRTEC) 10 MG tablet Take 1 tablet (10 mg total) by mouth daily for 10 days. 11/17/21 11/27/21  Horald Pollen, MD  famotidine (PEPCID) 40 MG tablet Take 1 tablet (40 mg total) by mouth daily for 7 days. 11/17/21 11/24/21  Horald Pollen, MD    Allergies  Allergen Reactions   Hydrocodone Other (See Comments)    drowsiness    Patient Active Problem List   Diagnosis Date Noted   Allergic dermatitis 09/14/2021   Primary osteoarthritis of left shoulder 04/27/2021   Spinal stenosis 02/22/2021   Alcohol abuse 11/30/2020   Bilateral hearing loss 11/30/2020   Insomnia 11/30/2020   Mucous polyp of cervix 11/30/2020  Degenerative arthritis of knee, bilateral 03/20/2018   Prediabetes 01/17/2018   Viral URI 08/30/2016   Rash and nonspecific skin eruption 12/22/2015   Arthralgia 11/17/2015   Left lumbar radiculopathy 10/25/2015   Menopausal state 08/22/2011   Hyperlipidemia 04/25/2010   HEARING LOSS 09/15/2009    Past Medical History:  Diagnosis Date   ABDOMINAL/PELVIC SWELLING MASS/LUMP UNSPEC SITE 09/18/2007   ACUTE URIS OF UNSPECIFIED SITE 06/06/2007   Allergy    Anemia     Anxiety    no meds   Arthritis of hip    08/13/2019: per patient "both hips"   Arthritis of knee, left    Arthritis of right knee    ASTHMATIC BRONCHITIS, ACUTE 05/11/2008   DM 09/18/2007   borderline -diet controlled- no meds   GERD (gastroesophageal reflux disease)    Glucose intolerance (impaired glucose tolerance)    HEARING LOSS 09/15/2009    mild -no hearing aids   HYPERLIPIDEMIA 04/25/2010   OSTEOPENIA 04/20/2007   Palpitations 03/13/2008   Pneumonia    THYROID NODULE, RIGHT 05/11/2008    Past Surgical History:  Procedure Laterality Date   BREAST BIOPSY Left 05/22/2019   BREAST BIOPSY Left 06/2020   DILATION AND CURETTAGE, DIAGNOSTIC / THERAPEUTIC     HAMMER TOE SURGERY     RIGHT   HERNIA REPAIR  1992   KNEE SURGERY  1991   Left   Left Knee cyst  1985   Lymph Node Removal     RADIOACTIVE SEED GUIDED EXCISIONAL BREAST BIOPSY Left 08/19/2019   Procedure: LEFT BREAST RADIOACTIVE SEED GUIDED EXCISIONAL BREAST BIOPSY;  Surgeon: Stark Klein, MD;  Location: [facility] OR;  Service: General;  Laterality: Left;    Social History   Socioeconomic History   Marital status: Married    Spouse name: Not on file   Number of children: Not on file   Years of education: Not on file   Highest education level: Not on file  Occupational History   Occupation: Nurse, adult: [employer]  Tobacco Use   Smoking status: Never   Smokeless tobacco: Never  Vaping Use   Vaping Use: Never used  Substance and Sexual Activity   Alcohol use: Yes    Alcohol/week: 14.0 standard drinks of alcohol    Types: 14 Glasses of wine per week   Drug use: No   Sexual activity: Yes    Birth control/protection: Post-menopausal  Other Topics Concern   Not on file  Social History Narrative   Not on file   Social Determinants of Health   Financial Resource Strain: Not on file  Food Insecurity: Not on file  Transportation Needs: Not on file  Physical Activity: Not on file  Stress: Not  on file  Social Connections: Not on file  Intimate Partner Violence: Not on file    Family History  Problem Relation Age of Onset   Cancer Mother        Breast Cancer   Breast cancer Mother    Cancer Father        "Stomach" Cancer   Stomach cancer Father    Cancer Sister        Colon Cancer   Breast cancer Sister    Kidney disease Brother        Kidney Transplant   Colon cancer Neg Hx    Esophageal cancer Neg Hx    Rectal cancer Neg Hx      Review of Systems  Constitutional: Negative.  Negative for chills and fever.  HENT: Negative.  Negative for congestion and sore throat.   Respiratory: Negative.  Negative for cough and shortness of breath.   Cardiovascular: Negative.  Negative for chest pain and palpitations.  Gastrointestinal:  Negative for abdominal pain, nausea and vomiting.  Skin:  Positive for rash.  Neurological: Negative.  Negative for dizziness and headaches.  All other systems reviewed and are negative.  Today's Vitals   11/17/21 1121  BP: 120/76  Pulse: 64  Temp: 97.7 F (36.5 C)  TempSrc: Oral  SpO2: 94%  Weight: 164 lb 8 oz (74.6 kg)  Height: '5\' 6"'$  (1.676 m)   Body mass index is 26.55 kg/m.   Physical Exam Vitals reviewed.  Constitutional:      Appearance: Normal appearance.  HENT:     Head: Normocephalic.  Eyes:     Extraocular Movements: Extraocular movements intact.  Cardiovascular:     Rate and Rhythm: Normal rate and regular rhythm.     Pulses: Normal pulses.     Heart sounds: Normal heart sounds.  Pulmonary:     Effort: Pulmonary effort is normal.     Breath sounds: Normal breath sounds.  Musculoskeletal:     Cervical back: No tenderness.  Lymphadenopathy:     Cervical: No cervical adenopathy.  Skin:    General: Skin is warm and dry.     Capillary Refill: Capillary refill takes less than 2 seconds.     Findings: Rash present.     Comments: Erythematous rash to area around neck and area under her breasts Scratch marks  visible.  No area of cellulitis  Neurological:     General: No focal deficit present.     Mental Status: She is alert and oriented to person, place, and time.  Psychiatric:        Mood and Affect: Mood normal.        Behavior: Behavior normal.      ASSESSMENT & PLAN: Problem List Items Addressed This Visit       Musculoskeletal and Integument   Allergic dermatitis - Primary    Recurrent problem.  Was referred to allergist last June. May benefit from corticosteroids.  Depo-Medrol 80 mg given today. Will follow with prednisone 40 mg daily for 5 days Cetirizine 10 mg and Pepcid 40 mg daily for 1 week Advised to contact the office if no better or worse during the next several days.      Relevant Medications   predniSONE (DELTASONE) 20 MG tablet   cetirizine (ZYRTEC) 10 MG tablet   famotidine (PEPCID) 40 MG tablet   methylPREDNISolone acetate (DEPO-MEDROL) injection 80 mg   Patient Instructions  Atopic Dermatitis Atopic dermatitis is a skin disorder that causes inflammation of the skin. It is marked by a red rash and itchy, dry, scaly skin. It is the most common type of eczema. Eczema is a group of skin conditions that cause the skin to become rough and swollen. This condition is generally worse during the cooler winter months and often improves during the warm summer months. Atopic dermatitis usually starts showing signs in infancy and can last through adulthood. This condition cannot be passed from one person to another (is not contagious). Atopic dermatitis may not always be present, but when it is, it is called a flare-up. What are the causes? The exact cause of this condition is not known. Flare-ups may be triggered by: Coming in contact with something that you are sensitive or allergic to (allergen). Stress.  Certain foods. Extremely hot or cold weather. Harsh chemicals and soaps. Dry air. Chlorine. What increases the risk? This condition is more likely to develop in  people who have a personal or family history of: Eczema. Allergies. Asthma. Hay fever. What are the signs or symptoms? Symptoms of this condition include: Dry, scaly skin. Red, itchy rash. Itchiness, which can be severe. This may occur before the skin rash. This can make sleeping difficult. Skin thickening and cracking that can occur over time. How is this diagnosed? This condition is diagnosed based on: Your symptoms. Your medical history. A physical exam. How is this treated? There is no cure for this condition, but symptoms can usually be controlled. Treatment focuses on: Controlling the itchiness and scratching. You may be given medicines, such as antihistamines or steroid creams. Limiting exposure to allergens. Recognizing situations that cause stress and developing a plan to manage stress. If your atopic dermatitis does not get better with medicines, or if it is all over your body (widespread), a treatment using a specific type of light (phototherapy) may be used. Follow these instructions at home: Skin care  Keep your skin well moisturized. Doing this seals in moisture and helps to prevent dryness. Use unscented lotions that have petroleum in them. Avoid lotions that contain alcohol or water. They can dry the skin. Keep baths or showers short (less than 5 minutes) in warm water. Do not use hot water. Use mild, unscented cleansers for bathing. Avoid soap and bubble bath. Apply a moisturizer to your skin right after a bath or shower. Do not apply anything to your skin without checking with your health care provider. General instructions Take or apply over-the-counter and prescription medicines only as told by your health care provider. Dress in clothes made of cotton or cotton blends. Dress lightly because heat increases itchiness. When washing your clothes, rinse your clothes twice so all of the soap is removed. Avoid any triggers that can cause a flare-up. Keep your  fingernails cut short. Avoid scratching. Scratching makes the rash and itchiness worse. A break in the skin from scratching could result in a skin infection (impetigo). Do not be around people who have cold sores or fever blisters. If you get the infection, it may cause your atopic dermatitis to worsen. Keep all follow-up visits. This is important. Contact a health care provider if: Your itchiness interferes with sleep. Your rash gets worse or is not better within one week of starting treatment. You have a fever. You have a rash flare-up after having contact with someone who has cold sores or fever blisters. Get help right away if: You develop pus or soft yellow scabs in the rash area. Summary Atopic dermatitis causes a red rash and itchy, dry, scaly skin. Treatment focuses on controlling the itchiness and scratching, limiting exposure to things that you are sensitive or allergic to (allergens), recognizing situations that cause stress, and developing a plan to manage stress. Keep your skin well moisturized. Keep baths or showers shorter than 5 minutes and use warm water. Do not use hot water. This information is not intended to replace advice given to you by your health care provider. Make sure you discuss any questions you have with your health care provider. Document Revised: 01/05/2020 Document Reviewed: 01/05/2020 Elsevier Patient Education  [place], MD [facility]

## 2021-11-17 NOTE — Assessment & Plan Note (Signed)
Recurrent problem.  Was referred to allergist last June. May benefit from corticosteroids.  Depo-Medrol 80 mg given today. Will follow with prednisone 40 mg daily for 5 days Cetirizine 10 mg and Pepcid 40 mg daily for 1 week Advised to contact the office if no better or worse during the next several days.

## 2021-11-17 NOTE — Patient Instructions (Signed)
Atopic Dermatitis ?Atopic dermatitis is a skin disorder that causes inflammation of the skin. It is marked by a red rash and itchy, dry, scaly skin. It is the most common type of eczema. Eczema is a group of skin conditions that cause the skin to become rough and swollen. This condition is generally worse during the cooler winter months and often improves during the warm summer months. ?Atopic dermatitis usually starts showing signs in infancy and can last through adulthood. This condition cannot be passed from one person to another (is not contagious). Atopic dermatitis may not always be present, but when it is, it is called a flare-up. ?What are the causes? ?The exact cause of this condition is not known. Flare-ups may be triggered by: ?Coming in contact with something that you are sensitive or allergic to (allergen). ?Stress. ?Certain foods. ?Extremely hot or cold weather. ?Harsh chemicals and soaps. ?Dry air. ?Chlorine. ?What increases the risk? ?This condition is more likely to develop in people who have a personal or family history of: ?Eczema. ?Allergies. ?Asthma. ?Hay fever. ?What are the signs or symptoms? ?Symptoms of this condition include: ?Dry, scaly skin. ?Red, itchy rash. ?Itchiness, which can be severe. This may occur before the skin rash. This can make sleeping difficult. ?Skin thickening and cracking that can occur over time. ?How is this diagnosed? ?This condition is diagnosed based on: ?Your symptoms. ?Your medical history. ?A physical exam. ?How is this treated? ?There is no cure for this condition, but symptoms can usually be controlled. Treatment focuses on: ?Controlling the itchiness and scratching. You may be given medicines, such as antihistamines or steroid creams. ?Limiting exposure to allergens. ?Recognizing situations that cause stress and developing a plan to manage stress. ?If your atopic dermatitis does not get better with medicines, or if it is all over your body (widespread), a  treatment using a specific type of light (phototherapy) may be used. ?Follow these instructions at home: ?Skin care ? ?Keep your skin well moisturized. Doing this seals in moisture and helps to prevent dryness. ?Use unscented lotions that have petroleum in them. ?Avoid lotions that contain alcohol or water. They can dry the skin. ?Keep baths or showers short (less than 5 minutes) in warm water. Do not use hot water. ?Use mild, unscented cleansers for bathing. Avoid soap and bubble bath. ?Apply a moisturizer to your skin right after a bath or shower. ?Do not apply anything to your skin without checking with your health care provider. ?General instructions ?Take or apply over-the-counter and prescription medicines only as told by your health care provider. ?Dress in clothes made of cotton or cotton blends. Dress lightly because heat increases itchiness. ?When washing your clothes, rinse your clothes twice so all of the soap is removed. ?Avoid any triggers that can cause a flare-up. ?Keep your fingernails cut short. ?Avoid scratching. Scratching makes the rash and itchiness worse. A break in the skin from scratching could result in a skin infection (impetigo). ?Do not be around people who have cold sores or fever blisters. If you get the infection, it may cause your atopic dermatitis to worsen. ?Keep all follow-up visits. This is important. ?Contact a health care provider if: ?Your itchiness interferes with sleep. ?Your rash gets worse or is not better within one week of starting treatment. ?You have a fever. ?You have a rash flare-up after having contact with someone who has cold sores or fever blisters. ?Get help right away if: ?You develop pus or soft yellow scabs in the rash   area. ?Summary ?Atopic dermatitis causes a red rash and itchy, dry, scaly skin. ?Treatment focuses on controlling the itchiness and scratching, limiting exposure to things that you are sensitive or allergic to (allergens), recognizing  situations that cause stress, and developing a plan to manage stress. ?Keep your skin well moisturized. ?Keep baths or showers shorter than 5 minutes and use warm water. Do not use hot water. ?This information is not intended to replace advice given to you by your health care provider. Make sure you discuss any questions you have with your health care provider. ?Document Revised: 01/05/2020 Document Reviewed: 01/05/2020 ?Elsevier Patient Education ? 2023 Elsevier Inc. ? ?

## 2021-11-25 DIAGNOSIS — R21 Rash and other nonspecific skin eruption: Secondary | ICD-10-CM | POA: Diagnosis not present

## 2021-12-05 DIAGNOSIS — R21 Rash and other nonspecific skin eruption: Secondary | ICD-10-CM | POA: Diagnosis not present

## 2021-12-05 DIAGNOSIS — L2089 Other atopic dermatitis: Secondary | ICD-10-CM | POA: Diagnosis not present

## 2021-12-05 DIAGNOSIS — L309 Dermatitis, unspecified: Secondary | ICD-10-CM | POA: Diagnosis not present

## 2021-12-05 DIAGNOSIS — Z79899 Other long term (current) drug therapy: Secondary | ICD-10-CM | POA: Diagnosis not present

## 2022-01-05 DIAGNOSIS — L2089 Other atopic dermatitis: Secondary | ICD-10-CM | POA: Diagnosis not present

## 2022-01-11 NOTE — Progress Notes (Unsigned)
Subjective:   Brenda [name] is a 71 y.o. female who presents for an Initial Medicare Annual Wellness Visit. I connected with  Brenda [name] on 01/12/22 by a audio enabled telemedicine application and verified that I am speaking with the correct person using two identifiers.  Patient Location: Home  Provider Location: Home Office  I discussed the limitations of evaluation and management by telemedicine. The patient expressed understanding and agreed to proceed.  Review of Systems    Deferred to PCP Cardiac Risk Factors include: advanced age (>79mn, >>50women);dyslipidemia;hypertension     Objective:    There were no vitals filed for this visit. There is no height or weight on file to calculate BMI.     01/12/2022   12:56 PM 08/19/2019    6:18 AM 08/13/2019    1:27 PM 12/21/2015    9:29 AM 08/27/2015    8:41 AM 11/20/2014   10:13 AM  Advanced Directives  Does Patient Have a Medical Advance Directive? No No No No No No  Does patient want to make changes to medical advance directive?  No - Patient declined Yes (MAU/Ambulatory/Procedural Areas - Information given)     Would patient like information on creating a medical advance directive? No - Patient declined No - Patient declined        Current Medications (verified) Outpatient Encounter Medications as of 01/12/2022  Medication Sig   acetaminophen (TYLENOL) 500 MG tablet Take 500 mg by mouth every 6 (six) hours as needed (for pain.).   atorvastatin (LIPITOR) 10 MG tablet Take 1 tablet (10 mg total) by mouth daily.   benzonatate (TESSALON) 200 MG capsule Take 1 capsule (200 mg total) by mouth 2 (two) times daily as needed for cough.   fluticasone (FLONASE) 50 MCG/ACT nasal spray Place 2 sprays into both nostrils daily.   gabapentin (NEURONTIN) 100 MG capsule Take 2 capsules (200 mg total) by mouth at bedtime.   HYDROcodone bit-homatropine (HYCODAN) 5-1.5 MG/5ML syrup Take 5 mLs by mouth every 8 (eight) hours as needed for cough.    hydrocortisone 2.5 % cream Apply topically.   hydrOXYzine (ATARAX/VISTARIL) 25 MG tablet Take 1 tablet (25 mg total) by mouth every 8 (eight) hours as needed (anxiety/itching.).   metroNIDAZOLE (METROCREAM) 0.75 % cream Apply 1 application topically in the morning and at bedtime.    omeprazole (PRILOSEC) 20 MG capsule Take 20 mg by mouth daily.   triamcinolone lotion (KENALOG) 0.1 % SMARTSIG:1 Application Topical Every Night PRN   albuterol (VENTOLIN HFA) 108 (90 Base) MCG/ACT inhaler Inhale 2 puffs into the lungs 2 (two) times daily for 5 days.   azithromycin (ZITHROMAX) 250 MG tablet Sig as indicated (Patient not taking: Reported on 01/12/2022)   cetirizine (ZYRTEC) 10 MG tablet Take 1 tablet (10 mg total) by mouth daily for 10 days.   clobetasol cream (TEMOVATE) 0.05 % APPLY SMALL AMOUNT TO AFFECTED AREA TWICE A DAY (Patient not taking: Reported on 01/12/2022)   famotidine (PEPCID) 40 MG tablet Take 1 tablet (40 mg total) by mouth daily for 7 days.   methylPREDNISolone (MEDROL DOSEPAK) 4 MG TBPK tablet Sig as indicated (Patient not taking: Reported on 01/12/2022)   Vitamin D, Ergocalciferol, (DRISDOL) 1.25 MG (50000 UNIT) CAPS capsule Take 1 capsule (50,000 Units total) by mouth every 7 (seven) days. (Patient not taking: Reported on 01/12/2022)   No facility-administered encounter medications on file as of 01/12/2022.    Allergies (verified) Hydrocodone   History: Past Medical History:  Diagnosis  Date   ABDOMINAL/PELVIC SWELLING MASS/LUMP UNSPEC SITE 09/18/2007   ACUTE URIS OF UNSPECIFIED SITE 06/06/2007   Allergy    Anemia    Anxiety    no meds   Arthritis of hip    08/13/2019: per patient "both hips"   Arthritis of knee, left    Arthritis of right knee    ASTHMATIC BRONCHITIS, ACUTE 05/11/2008   DM 09/18/2007   borderline -diet controlled- no meds   GERD (gastroesophageal reflux disease)    Glucose intolerance (impaired glucose tolerance)    HEARING LOSS 09/15/2009    mild -no hearing  aids   HYPERLIPIDEMIA 04/25/2010   OSTEOPENIA 04/20/2007   Palpitations 03/13/2008   Pneumonia    THYROID NODULE, RIGHT 05/11/2008   Past Surgical History:  Procedure Laterality Date   BREAST BIOPSY Left 05/22/2019   BREAST BIOPSY Left 06/2020   DILATION AND CURETTAGE, DIAGNOSTIC / THERAPEUTIC     HAMMER TOE SURGERY     RIGHT   HERNIA REPAIR  1992   KNEE SURGERY  1991   Left   Left Knee cyst  1985   Lymph Node Removal     RADIOACTIVE SEED GUIDED EXCISIONAL BREAST BIOPSY Left 08/19/2019   Procedure: LEFT BREAST RADIOACTIVE SEED GUIDED EXCISIONAL BREAST BIOPSY;  Surgeon: Stark Klein, MD;  Location: [facility] OR;  Service: General;  Laterality: Left;   Family History  Problem Relation Age of Onset   Cancer Mother        Breast Cancer   Breast cancer Mother    Cancer Father        "Stomach" Cancer   Stomach cancer Father    Cancer Sister        Colon Cancer   Breast cancer Sister    Kidney disease Brother        Kidney Transplant   Colon cancer Neg Hx    Esophageal cancer Neg Hx    Rectal cancer Neg Hx    Social History   Socioeconomic History   Marital status: Married    Spouse name: Cedric   Number of children: 2   Years of education: masters   Highest education level: Not on file  Occupational History   Occupation: Nurse, adult: [employer]  Tobacco Use   Smoking status: Never   Smokeless tobacco: Never  Vaping Use   Vaping Use: Never used  Substance and Sexual Activity   Alcohol use: Not Currently    Alcohol/week: 14.0 standard drinks of alcohol    Types: 14 Glasses of Verneal Wiers per week   Drug use: No   Sexual activity: Yes    Birth control/protection: Post-menopausal  Other Topics Concern   Not on file  Social History Narrative   Not on file   Social Determinants of Health   Financial Resource Strain: Low Risk  (01/12/2022)   Overall Financial Resource Strain (CARDIA)    Difficulty of Paying Living Expenses: Not hard at all  Food  Insecurity: No Food Insecurity (01/12/2022)   Hunger Vital Sign    Worried About Estate manager/land agent of Food in the Last Year: Never true    Ran Out of Food in the Last Year: Never true  Transportation Needs: No Transportation Needs (01/12/2022)   PRAPARE - Hydrologist (Medical): No    Lack of Transportation (Non-Medical): No  Physical Activity: Sufficiently Active (01/12/2022)   Exercise Vital Sign    Days of Exercise per Week: 6 days  Minutes of Exercise per Session: 30 min  Stress: Stress Concern Present (01/12/2022)   [place]    Feeling of Stress : To some extent  Social Connections: Moderately Integrated (01/12/2022)   Social Connection and Isolation Panel [NHANES]    Frequency of Communication with Friends and Family: More than three times a week    Frequency of Social Gatherings with Friends and Family: Twice a week    Attends Religious Services: 1 to 4 times per year    Active Member of [employer] or Organizations: No    Attends Music therapist: Never    Marital Status: Married    Tobacco Counseling Counseling given: Not Answered   Clinical Intake:  Pre-visit preparation completed: Yes  Pain : No/denies pain     Nutritional Risks: None Diabetes: No  How often do you need to have someone help you when you read instructions, pamphlets, or other written materials from your doctor or pharmacy?: 1 - Never What is the last grade level you completed in school?: masters  Diabetic?No  Interpreter Needed?: No  Information entered by :: Emelia Loron RN   Activities of Daily Living    01/12/2022   12:54 PM  In your present state of health, do you have any difficulty performing the following activities:  Hearing? 0  Vision? 0  Difficulty concentrating or making decisions? 0  Walking or climbing stairs? 0  Dressing or bathing? 0  Doing errands, shopping? 0  Preparing Food  and eating ? N  Using the Toilet? N  In the past six months, have you accidently leaked urine? N  Do you have problems with loss of bowel control? N  Managing your Medications? N  Managing your Finances? N  Housekeeping or managing your Housekeeping? N    Patient Care Team: Horald Pollen, MD as PCP - General (Internal Medicine) Lafayette Dragon, MD (Inactive) (Gastroenterology) Molli Posey, MD as Attending Physician (Obstetrics and Gynecology) Izora Gala, MD as Attending Physician (Otolaryngology)  Indicate any recent Medical Services you may have received from other than [facility] providers in the past year (date may be approximate).     Assessment:   This is a routine wellness examination for Maida.  Hearing/Vision screen No results found.  Dietary issues and exercise activities discussed: Current Exercise Habits: Home exercise routine;Structured exercise class, Type of exercise: walking;strength training/weights, Time (Minutes): 40, Frequency (Times/Week): 5, Weekly Exercise (Minutes/Week): 200, Intensity: Mild, Exercise limited by: orthopedic condition(s)   Goals Addressed             This Visit's Progress    Patient Stated       I would like to lose my belly by continuing to eat healthy and exercising.      Depression Screen    01/12/2022   12:48 PM 11/17/2021   11:25 AM 09/14/2021   10:58 AM 09/14/2021   10:55 AM 08/01/2021    3:48 PM 02/22/2021   11:11 AM 02/09/2020   10:24 AM  PHQ 2/9 Scores  PHQ - 2 Score 0 0 0 0 0 0 0    Fall Risk    01/12/2022   12:56 PM 11/17/2021   11:25 AM 09/14/2021   10:58 AM 09/14/2021   10:55 AM 08/01/2021    3:48 PM  Fall Risk   Falls in the past year? 1 0 0 0 0  Number falls in past yr: 0 0     Injury with  Fall? 0 0     Risk for fall due to : Impaired vision;History of fall(s);Impaired balance/gait;Impaired mobility No Fall Risks     Follow up Falls evaluation completed Falls evaluation completed       [place]:  Any stairs in or around the home? Yes  If so, are there any without handrails? Yes  Home free of loose throw rugs in walkways, pet beds, electrical cords, etc? Yes  Adequate lighting in your home to reduce risk of falls? Yes   ASSISTIVE DEVICES UTILIZED TO PREVENT FALLS:  Life alert? No  Use of a cane, walker or w/c? No  Grab bars in the bathroom? Yes  Shower chair or bench in shower? Yes  Elevated toilet seat or a handicapped toilet? Yes   Cognitive Function:        01/12/2022   12:58 PM  6CIT Screen  What Year? 0 points  What month? 0 points  What time? 0 points  Count back from 20 0 points  Months in reverse 0 points  Repeat phrase 0 points  Total Score 0 points    Immunizations Immunization History  Administered Date(s) Administered   Fluad Quad(high Dose 65+) 01/21/2019, 02/09/2020, 02/22/2021   Influenza,inj,Quad PF,6+ Mos 12/21/2015, 01/22/2017   Influenza-Unspecified 01/09/2019, 02/09/2020, 01/08/2021   PFIZER(Purple Top)SARS-COV-2 Vaccination 04/29/2019, 05/20/2019, 01/16/2020, 01/16/2020   Pneumococcal Conjugate-13 01/17/2018   Pneumococcal Polysaccharide-23 09/19/2013, 01/17/2018   Td 05/11/1998, 04/10/2000   Tdap 07/21/2014, 11/20/2014   Zoster Recombinat (Shingrix) 01/21/2019, 04/09/2019   Zoster, Live 09/19/2013    TDAP status: Up to date  Flu Vaccine status: Due, Education has been provided regarding the importance of this vaccine. Advised may receive this vaccine at local pharmacy or Health Dept. Aware to provide a copy of the vaccination record if obtained from local pharmacy or Health Dept. Verbalized acceptance and understanding.  Pneumococcal vaccine status: Up to date  Covid-19 vaccine status: Information provided on how to obtain vaccines.   Qualifies for Shingles Vaccine? Yes   Zostavax completed No   Shingrix Completed?: Yes  Screening Tests Health Maintenance  Topic Date Due   COVID-19 Vaccine (5  - Pfizer risk series) 03/12/2020   MAMMOGRAM  03/24/2021   INFLUENZA VACCINE  07/09/2022 (Originally 11/08/2021)   TETANUS/TDAP  11/19/2024   COLONOSCOPY (Pts 45-52yr Insurance coverage will need to be confirmed)  09/16/2025   Pneumonia Vaccine 69 Years old  Completed   DEXA SCAN  Completed   Hepatitis C Screening  Completed   Zoster Vaccines- Shingrix  Completed   HPV VACCINES  Aged Out    Health Maintenance  Health Maintenance Due  Topic Date Due   COVID-19 Vaccine (5 - Pfizer risk series) 03/12/2020   MAMMOGRAM  03/24/2021    Colorectal cancer screening: Type of screening: Colonoscopy. Completed 09/17/15. Repeat every 10 years  Mammogram status: Completed 04/22/21. Repeat every year  Bone Density status: Completed 03/24/18. Results reflect: Bone density results: NORMAL. Repeat every 2 years.  Lung Cancer Screening: (Low Dose CT Chest recommended if Age 69-80years, 30 pack-year currently smoking OR have quit w/in 15years.) does not qualify.   Additional Screening:  Hepatitis C Screening: does qualify; Completed 12/21/15  Vision Screening: Recommended annual ophthalmology exams for early detection of glaucoma and other disorders of the eye. Is the patient up to date with their annual eye exam?  Yes  Who is the provider or what is the name of the office in which the patient  attends annual eye exams? [facility] If pt is not established with a provider, would they like to be referred to a provider to establish care?  N/A .   Dental Screening: Recommended annual dental exams for proper oral hygiene  Community Resource Referral / Chronic Care Management: CRR required this visit?  No   CCM required this visit?  No      Plan:     I have personally reviewed and noted the following in the patient's chart:   Medical and social history Use of alcohol, tobacco or illicit drugs  Current medications and supplements including opioid prescriptions. Patient is not currently  taking opioid prescriptions. Functional ability and status Nutritional status Physical activity Advanced directives List of other physicians Hospitalizations, surgeries, and ER visits in previous 12 months Vitals Screenings to include cognitive, depression, and falls Referrals and appointments  In addition, I have reviewed and discussed with patient certain preventive protocols, quality metrics, and best practice recommendations. A written personalized care plan for preventive services as well as general preventive health recommendations were provided to patient.     Michiel Cowboy, RN   01/12/2022   Nurse Notes:  Ms. Bonello , Thank you for taking time to come for your Medicare Wellness Visit. I appreciate your ongoing commitment to your health goals. Please review the following plan we discussed and let me know if I can assist you in the future.   These are the goals we discussed:  Goals      Patient Stated     I would like to lose my belly by continuing to eat healthy and exercising.        This is a list of the screening recommended for you and due dates:  Health Maintenance  Topic Date Due   COVID-19 Vaccine (5 - Pfizer risk series) 03/12/2020   Mammogram  03/24/2021   Flu Shot  07/09/2022*   Tetanus Vaccine  11/19/2024   Colon Cancer Screening  09/16/2025   Pneumonia Vaccine  Completed   DEXA scan (bone density measurement)  Completed   Hepatitis C Screening: USPSTF Recommendation to screen - Ages 75-79 yo.  Completed   Zoster (Shingles) Vaccine  Completed   HPV Vaccine  Aged Out  *Topic was postponed. The date shown is not the original due date.

## 2022-01-11 NOTE — Patient Instructions (Signed)

## 2022-01-12 ENCOUNTER — Ambulatory Visit (INDEPENDENT_AMBULATORY_CARE_PROVIDER_SITE_OTHER): Payer: Medicare HMO | Admitting: *Deleted

## 2022-01-12 DIAGNOSIS — Z0000 Encounter for general adult medical examination without abnormal findings: Secondary | ICD-10-CM | POA: Diagnosis not present

## 2022-01-19 DIAGNOSIS — L2089 Other atopic dermatitis: Secondary | ICD-10-CM | POA: Diagnosis not present

## 2022-03-05 ENCOUNTER — Emergency Department (HOSPITAL_COMMUNITY): Payer: Medicare HMO

## 2022-03-05 ENCOUNTER — Encounter (HOSPITAL_COMMUNITY): Payer: Self-pay

## 2022-03-05 ENCOUNTER — Other Ambulatory Visit: Payer: Self-pay

## 2022-03-05 ENCOUNTER — Emergency Department (EMERGENCY_DEPARTMENT_HOSPITAL): Payer: Medicare HMO

## 2022-03-05 ENCOUNTER — Emergency Department (HOSPITAL_COMMUNITY)
Admission: EM | Admit: 2022-03-05 | Discharge: 2022-03-05 | Disposition: A | Discharge status: Home / Self Care | Payer: Medicare HMO | Attending: Emergency Medicine | Admitting: Emergency Medicine

## 2022-03-05 DIAGNOSIS — M62831 Muscle spasm of calf: Secondary | ICD-10-CM

## 2022-03-05 DIAGNOSIS — M25562 Pain in left knee: Secondary | ICD-10-CM | POA: Diagnosis not present

## 2022-03-05 DIAGNOSIS — M79605 Pain in left leg: Secondary | ICD-10-CM | POA: Diagnosis present

## 2022-03-05 DIAGNOSIS — E119 Type 2 diabetes mellitus without complications: Secondary | ICD-10-CM | POA: Insufficient documentation

## 2022-03-05 DIAGNOSIS — M62838 Other muscle spasm: Secondary | ICD-10-CM

## 2022-03-05 MED ORDER — KETOROLAC TROMETHAMINE 15 MG/ML IJ SOLN
15.0000 mg | Freq: Once | INTRAMUSCULAR | Status: AC
  Administered 2022-03-05: 15 mg via INTRAMUSCULAR
  Filled 2022-03-05: qty 1

## 2022-03-05 MED ORDER — CYCLOBENZAPRINE HCL 10 MG PO TABS: 10.0000 mg | ORAL_TABLET | Freq: Two times a day (BID) | ORAL | 0 refills | Status: DC | PRN

## 2022-03-05 MED ORDER — CYCLOBENZAPRINE HCL 10 MG PO TABS
5.0000 mg | ORAL_TABLET | Freq: Once | ORAL | Status: AC
  Administered 2022-03-05: 5 mg via ORAL
  Filled 2022-03-05: qty 1

## 2022-03-05 NOTE — ED Notes (Signed)
Patient transported to [facility] 

## 2022-03-05 NOTE — Discharge Instructions (Addendum)
We evaluated you for your left leg pain.  Your x-ray did not show any fracture.  Your ultrasound did not show any blood clots.  Your symptoms are likely due to a muscle spasm.  I have prescribed you a muscle relaxant which you can take for your symptoms as needed.  I also recommend taking 1000 mg of Tylenol every 6 hours for your pain.  Please follow-up with your orthopedic surgeon.  If your symptoms worsen, or you develop leg swelling, nausea, vomiting, chest pain, fevers or chills, inability to walk, or any other concerning symptoms please return to the emergency department.

## 2022-03-05 NOTE — ED Provider Notes (Signed)
[facility] EMERGENCY DEPARTMENT Provider Note  CSN: [id number] Arrival date & time: 03/05/22 7096  Chief Complaint(s) No chief complaint on file.  HPI Brenda [name] is a 71 y.o. female with history of hyperlipidemia, diabetes presenting with left leg pain.  Patient reports that the pain began last night.  She reports the pain is intermittent.  She denies any recent travel or surgeries.  Denies any trauma.  She denies fevers or chills.  She has been able to ambulate.  Symptoms moderate.   Past Medical History Past Medical History:  Diagnosis Date   ABDOMINAL/PELVIC SWELLING MASS/LUMP UNSPEC SITE 09/18/2007   ACUTE URIS OF UNSPECIFIED SITE 06/06/2007   Allergy    Anemia    Anxiety    no meds   Arthritis of hip    08/13/2019: per patient "both hips"   Arthritis of knee, left    Arthritis of right knee    ASTHMATIC BRONCHITIS, ACUTE 05/11/2008   DM 09/18/2007   borderline -diet controlled- no meds   GERD (gastroesophageal reflux disease)    Glucose intolerance (impaired glucose tolerance)    HEARING LOSS 09/15/2009    mild -no hearing aids   HYPERLIPIDEMIA 04/25/2010   OSTEOPENIA 04/20/2007   Palpitations 03/13/2008   Pneumonia    THYROID NODULE, RIGHT 05/11/2008   Patient Active Problem List   Diagnosis Date Noted   Allergic dermatitis 09/14/2021   Primary osteoarthritis of left shoulder 04/27/2021   Spinal stenosis 02/22/2021   Alcohol abuse 11/30/2020   Bilateral hearing loss 11/30/2020   Insomnia 11/30/2020   Mucous polyp of cervix 11/30/2020   Degenerative arthritis of knee, bilateral 03/20/2018   Prediabetes 01/17/2018   Rash and nonspecific skin eruption 12/22/2015   Arthralgia 11/17/2015   Left lumbar radiculopathy 10/25/2015   Menopausal state 08/22/2011   Hyperlipidemia 04/25/2010   HEARING LOSS 09/15/2009   Home Medication(s) Prior to Admission medications   Medication Sig Start Date End Date Taking? Authorizing Provider  cyclobenzaprine  (FLEXERIL) 10 MG tablet Take 1 tablet (10 mg total) by mouth 2 (two) times daily as needed for muscle spasms. 03/05/22  Yes Cristie Hem, MD  acetaminophen (TYLENOL) 500 MG tablet Take 500 mg by mouth every 6 (six) hours as needed (for pain.).    [provider]  albuterol (VENTOLIN HFA) 108 (90 Base) MCG/ACT inhaler Inhale 2 puffs into the lungs 2 (two) times daily for 5 days. 08/01/21 08/06/21  Horald Pollen, MD  atorvastatin (LIPITOR) 10 MG tablet Take 1 tablet (10 mg total) by mouth daily. 02/22/21   Horald Pollen, MD  azithromycin [facility]) 250 MG tablet Sig as indicated Patient not taking: Reported on 01/12/2022 08/01/21   Horald Pollen, MD  benzonatate (TESSALON) 200 MG capsule Take 1 capsule (200 mg total) by mouth 2 (two) times daily as needed for cough. 08/01/21   Horald Pollen, MD  cetirizine (ZYRTEC) 10 MG tablet Take 1 tablet (10 mg total) by mouth daily for 10 days. 11/17/21 11/27/21  Horald Pollen, MD  clobetasol cream (TEMOVATE) 0.05 % APPLY SMALL AMOUNT TO AFFECTED AREA TWICE A DAY Patient not taking: Reported on 01/12/2022 10/14/15   [provider]  famotidine (PEPCID) 40 MG tablet Take 1 tablet (40 mg total) by mouth daily for 7 days. 11/17/21 11/24/21  Horald Pollen, MD  fluticasone [facility]) 50 MCG/ACT nasal spray Place 2 sprays into both nostrils daily. 07/12/21   Hoyt Koch, MD  gabapentin (NEURONTIN) 100 MG  capsule Take 2 capsules (200 mg total) by mouth at bedtime. 03/20/18   Lyndal Pulley, DO  HYDROcodone bit-homatropine (HYCODAN) 5-1.5 MG/5ML syrup Take 5 mLs by mouth every 8 (eight) hours as needed for cough. 07/12/21   Hoyt Koch, MD  hydrocortisone 2.5 % cream Apply topically. 08/19/21   [provider]  hydrOXYzine (ATARAX/VISTARIL) 25 MG tablet Take 1 tablet (25 mg total) by mouth every 8 (eight) hours as needed (anxiety/itching.). 02/09/20   Marrian Salvage, FNP   metroNIDAZOLE (METROCREAM) 0.75 % cream Apply 1 application topically in the morning and at bedtime.  01/03/18   [provider]  omeprazole (PRILOSEC) 20 MG capsule Take 20 mg by mouth daily.    [provider]  triamcinolone lotion (KENALOG) 0.1 % SMARTSIG:1 Application Topical Every Night PRN 01/04/22   [provider]  Vitamin D, Ergocalciferol, (DRISDOL) 1.25 MG (50000 UNIT) CAPS capsule Take 1 capsule (50,000 Units total) by mouth every 7 (seven) days. Patient not taking: Reported on 01/12/2022 05/13/19   Lyndal Pulley, DO                                                                                                                                    Past Surgical History Past Surgical History:  Procedure Laterality Date   BREAST BIOPSY Left 05/22/2019   BREAST BIOPSY Left 06/2020   DILATION AND CURETTAGE, DIAGNOSTIC / THERAPEUTIC     HAMMER TOE SURGERY     RIGHT   HERNIA REPAIR  1992   KNEE SURGERY  1991   Left   Left Knee cyst  1985   Lymph Node Removal     RADIOACTIVE SEED GUIDED EXCISIONAL BREAST BIOPSY Left 08/19/2019   Procedure: LEFT BREAST RADIOACTIVE SEED GUIDED EXCISIONAL BREAST BIOPSY;  Surgeon: Stark Klein, MD;  Location: [place];  Service: General;  Laterality: Left;   Family History Family History  Problem Relation Age of Onset   Cancer Mother        Breast Cancer   Breast cancer Mother    Cancer Father        "Stomach" Cancer   Stomach cancer Father    Cancer Sister        Colon Cancer   Breast cancer Sister    Kidney disease Brother        Kidney Transplant   Colon cancer Neg Hx    Esophageal cancer Neg Hx    Rectal cancer Neg Hx     Social History Social History   Tobacco Use   Smoking status: Never   Smokeless tobacco: Never  Vaping Use   Vaping Use: Never used  Substance Use Topics   Alcohol use: Not Currently    Alcohol/week: 14.0 standard drinks of alcohol    Types: 14 Glasses of wine per week   Drug use: No    Allergies Hydrocodone  Review of Systems Review of Systems  All other systems reviewed and are negative.   Physical Exam Vital Signs  I have reviewed the triage vital signs BP (!) 145/86   Pulse 70   Temp 97.7 F (36.5 C) (Oral)   Resp 14   SpO2 99%  Physical Exam Vitals and nursing note reviewed.  Constitutional:      General: She is not in acute distress.    Appearance: She is well-developed.  HENT:     Head: Normocephalic and atraumatic.     Mouth/Throat:     Mouth: Mucous membranes are moist.  Eyes:     Pupils: Pupils are equal, round, and reactive to light.  Cardiovascular:     Rate and Rhythm: Normal rate and regular rhythm.     Heart sounds: No murmur heard. Pulmonary:     Effort: Pulmonary effort is normal. No respiratory distress.     Breath sounds: Normal breath sounds.  Abdominal:     General: Abdomen is flat.     Palpations: Abdomen is soft.     Tenderness: There is no abdominal tenderness.  Musculoskeletal:     Right lower leg: No edema.     Left lower leg: No edema.     Comments: Patient localizes pain to the posterior aspect of the left knee.  No lower extremity edema bilaterally.  No focal tenderness no leg swelling or warmth.  2+ distal DP pulses bilaterally  Skin:    General: Skin is warm and dry.  Neurological:     General: No focal deficit present.     Mental Status: She is alert. Mental status is at baseline.  Psychiatric:        Mood and Affect: Mood normal.        Behavior: Behavior normal.     ED Results and Treatments Labs (all labs ordered are listed, but only abnormal results are displayed) Labs Reviewed - No data to display                                                                                                                        Radiology No results found.  Pertinent labs & imaging results that were available during my care of the patient were reviewed by me and considered in my medical decision making (see MDM  for details).  Medications Ordered in ED Medications  ketorolac (TORADOL) 15 MG/ML injection 15 mg (15 mg Intramuscular Given 03/05/22 1633)  cyclobenzaprine (FLEXERIL) tablet 5 mg (5 mg Oral Given 03/05/22 2002)  Procedures Procedures  (including critical care time)  Medical Decision Making / ED Course   MDM:  72 year old female presenting to the emergency department with leg pain.  Patient overall well-appearing, physical exam without focal tenderness or objective abnormality.  Distal pulses intact.  X-ray negative for fracture.  No sign of effusion on the exam to suggest septic joint and patient able to range the joint, no fevers.  Will obtain ultrasound to evaluate for DVT of the lower concern without objective findings.  Will treat pain with Toradol.  If ultrasound negative likely discharge  Clinical Course as of 03/08/22 0740  Sun Mar 05, 2022  2013 [place] negative. Pain somewhat improved. Advised follow up with sports medicine for further evaluation. Will discharge patient to home. All questions answered. Patient comfortable with plan of discharge. Return precautions discussed with patient and specified on the after visit summary.  [WS]    Clinical Course User Index [WS] Cristie Hem, MD     Additional history obtained: -Additional history obtained from spouse   Imaging Studies ordered: I ordered imaging studies including [place] LE On my interpretation imaging demonstrates no DVT I independently visualized and interpreted imaging. I agree with the radiologist interpretation   Medicines ordered and prescription drug management: Meds ordered this encounter  Medications   ketorolac (TORADOL) 15 MG/ML injection 15 mg   cyclobenzaprine (FLEXERIL) 10 MG tablet    Sig: Take 1 tablet (10 mg total) by mouth 2 (two) times daily as needed for  muscle spasms.    Dispense:  20 tablet    Refill:  0   cyclobenzaprine (FLEXERIL) tablet 5 mg    -I have reviewed the patients home medicines and have made adjustments as needed   Reevaluation: After the interventions noted above, I reevaluated the patient and found that they have improved  Co morbidities that complicate the patient evaluation  Past Medical History:  Diagnosis Date   ABDOMINAL/PELVIC SWELLING MASS/LUMP UNSPEC SITE 09/18/2007   ACUTE URIS OF UNSPECIFIED SITE 06/06/2007   Allergy    Anemia    Anxiety    no meds   Arthritis of hip    08/13/2019: per patient "both hips"   Arthritis of knee, left    Arthritis of right knee    ASTHMATIC BRONCHITIS, ACUTE 05/11/2008   DM 09/18/2007   borderline -diet controlled- no meds   GERD (gastroesophageal reflux disease)    Glucose intolerance (impaired glucose tolerance)    HEARING LOSS 09/15/2009    mild -no hearing aids   HYPERLIPIDEMIA 04/25/2010   OSTEOPENIA 04/20/2007   Palpitations 03/13/2008   Pneumonia    THYROID NODULE, RIGHT 05/11/2008      Dispostion: Disposition decision including need for hospitalization was considered, and patient discharged from emergency department.    Final Clinical Impression(s) / ED Diagnoses Final diagnoses:  Muscle spasm of left lower extremity     This chart was dictated using voice recognition software.  Despite best efforts to proofread,  errors can occur which can change the documentation meaning.    Cristie Hem, MD 03/08/22 [phone]

## 2022-03-05 NOTE — ED Provider Triage Note (Addendum)
Emergency Medicine Provider Triage Evaluation Note  ESME DURKIN , a 71 y.o. female  was evaluated in triage.  Pt complains of left leg pain.  Started last night all of a sudden.  Pain is located in the left leg just to the lateral side of the knee.  She says the pain comes on quick at times can radiate up to the mid part of her upper leg.  It last for a few seconds and goes away.  Denies calf tenderness.  Denies shortness of breath or chest pain. Denies trauma.   Review of Systems  Positive: See above Negative: See above  Physical Exam  BP 123/70 (BP Location: Right Arm)   Pulse 80   Temp 98.1 F (36.7 C) (Oral)   Resp 18   SpO2 97%  Gen:   Awake, no distress   Resp:  Normal effort  MSK:   Moves extremities without difficulty  Other:    Medical Decision Making  Medically screening exam initiated at 10:15 AM.  Appropriate orders placed.  NAKESHIA WALDECK was informed that the remainder of the evaluation will be completed by another provider, this initial triage assessment does not replace that evaluation, and the importance of remaining in the ED until their evaluation is complete.  Work up initiated   Harriet Pho, PA-C 03/05/22 [place], Chauntae Hults K, PA-C 03/05/22 1017

## 2022-03-05 NOTE — Progress Notes (Signed)
VASCULAR LAB    Left lower extremity venous duplex has been performed.  See CV proc for preliminary results.  Messaged negative results to Dr. Truett Mainland via secure chat  Sharion Dove, RVT 03/05/2022, 6:27 PM

## 2022-03-05 NOTE — ED Triage Notes (Signed)
Patient complains of left posterior leg pain x 1 day. Describes as stabbing spasm, no hx of DVT. Patient also reports some intermittent toe numbness

## 2022-03-07 ENCOUNTER — Ambulatory Visit (INDEPENDENT_AMBULATORY_CARE_PROVIDER_SITE_OTHER): Payer: Medicare HMO | Admitting: Family Medicine

## 2022-03-07 ENCOUNTER — Ambulatory Visit: Payer: Self-pay

## 2022-03-07 VITALS — BP 188/76 | HR 87 | Ht 66.0 in | Wt 167.0 lb

## 2022-03-07 DIAGNOSIS — M25562 Pain in left knee: Secondary | ICD-10-CM

## 2022-03-07 NOTE — Progress Notes (Signed)
I, Peterson Lombard, LAT, ATC acting as a scribe for Brenda Leader, MD.  Brenda [name] is a 71 y.o. female who presents to [place] at [facility] today for L knee/LE pain. Pt was previously seen by Dr. Glennon Mac on 05/05/21 for L shoulder pain and Dr. Tamala Julian on 05/13/19 for lumbar radiculopathy. Pt was seen at the [facility] ED on 03/05/22 w/ L posterior LE pain and a vasc US was performed. Today, pt c/o L knee/LE ongoing Saturday evening w/ no MOI. Pt locates pain to the posterior lateral aspect of the L knee.  L knee swelling: no Mechanical symptoms: yes Aggravates: nothing in particular Treatments tried:   Dx testing: 03/05/22 L knee XR & L LE vasc US  Pertinent review of systems: No fevers or chills  Relevant historical information: Lumbar radiculopathy in the past.  History of a left Baker's cyst surgery   Exam:  BP (!) 188/76   Pulse 87   Ht '5\' 6"'$  (1.676 m)   Wt 167 lb (75.8 kg)   SpO2 97%   BMI 26.95 kg/m  General: Well Developed, well nourished, and in no acute distress.   MSK: Left knee mature scar posterior medial knee otherwise normal. Normal motion.  Tender palpation posterior medial knee.  Normal knee strength.  Pain is not significant reducible with resisted knee flexion or extension. Stable ligamentous exam. Positive lateral McMurray's test.    Lab and Radiology Results  Diagnostic Limited MSK Ultrasound of: Left knee Quad tendon and patellar tendon are intact and normal appearing Medial joint line is normal. Lateral joint line is normal. Posterior lateral knee largely normal-appearing with only trace hypoechoic fluid tracking along the hamstring tendons. The common fibular nerve is normal-appearing Impression: Largely normal-appearing knee ultrasound examination.  Small hypoechoic fluid tracking of the posterior lateral knee.      No results found for this or any previous visit (from the past 72 hour(s)). VAS [place] LOWER EXTREMITY VENOUS  (DVT) (7a-7p)  Result Date: 03/05/2022  Lower Venous DVT Study Patient Name:  Brenda [name]  Date of Exam:   03/05/2022 Medical Rec #: [id number]     Accession #:    [id number] Date of Birth: 1951-02-17    Patient Gender: F Patient Age:   37 years Exam Location:  [facility] Procedure:      VAS [place] LOWER EXTREMITY VENOUS (DVT) Referring Phys: Garnette Gunner --------------------------------------------------------------------------------  Indications: Spasam/Charlie Horse type pain at lateral knee.  Comparison Study: No prior study Performing Technologist: Sharion Dove RVS  Examination Guidelines: A complete evaluation includes B-mode imaging, spectral Doppler, color Doppler, and power Doppler as needed of all accessible portions of each vessel. Bilateral testing is considered an integral part of a complete examination. Limited examinations for reoccurring indications may be performed as noted. The reflux portion of the exam is performed with the patient in reverse Trendelenburg.  Right Technical Findings: Right leg not evaluated.  +---------+---------------+---------+-----------+----------+--------------+ LEFT     CompressibilityPhasicitySpontaneityPropertiesThrombus Aging +---------+---------------+---------+-----------+----------+--------------+ CFV      Full           Yes      Yes                                 +---------+---------------+---------+-----------+----------+--------------+ SFJ      Full                                                        +---------+---------------+---------+-----------+----------+--------------+  FV Prox  Full                                                        +---------+---------------+---------+-----------+----------+--------------+ FV Mid   Full                                                        +---------+---------------+---------+-----------+----------+--------------+ FV DistalFull                                                         +---------+---------------+---------+-----------+----------+--------------+ PFV      Full                                                        +---------+---------------+---------+-----------+----------+--------------+ POP      Full           Yes      Yes                                 +---------+---------------+---------+-----------+----------+--------------+ PTV      Full                                                        +---------+---------------+---------+-----------+----------+--------------+ PERO     Full                                                        +---------+---------------+---------+-----------+----------+--------------+ Gastroc  Full                                                        +---------+---------------+---------+-----------+----------+--------------+     Summary: LEFT: - There is no evidence of deep vein thrombosis in the lower extremity.  - No cystic structure found in the popliteal fossa.  *See table(s) above for measurements and observations. Electronically signed by Harold Barban MD on 03/05/2022 at 8:08:48 PM.    Final    DG Knee Complete 4 Views Left  Result Date: 03/05/2022 CLINICAL DATA:  Left knee pain without known injury. EXAM: LEFT KNEE - COMPLETE 4+ VIEW COMPARISON:  None Available. FINDINGS: No evidence of fracture, dislocation, or joint effusion. Mild degenerative changes seen involving the medial joint space. Soft tissues are unremarkable. IMPRESSION: Mild degenerative joint disease is noted medially. No  acute abnormality seen. Electronically Signed   By: Marijo Conception M.D.   On: 03/05/2022 11:27    I, Brenda [name], personally (independently) visualized and performed the interpretation of the xray images attached in this note.    Assessment and Plan: 71 y.o. female with left knee pain.  Patient notes pain at the posterior lateral knee without injury.  The most likely explanation of pain at this  time is the posterior lateral meniscus injury.  Given the severity of her pain and mechanical symptoms and positive McMurray's test we will proceed to MRI to further evaluate source of pain.  This should help dictate future treatment including injection and surgery or physical therapy.   PDMP not reviewed this encounter. Orders Placed This Encounter  Procedures   [place] LIMITED JOINT SPACE STRUCTURES LOW LEFT(NO LINKED CHARGES)    Order Specific Question:   Reason for Exam (SYMPTOM  OR DIAGNOSIS REQUIRED)    Answer:   left knee pain    Order Specific Question:   Preferred imaging location?    Answer:   [place]   MR Knee Left  Wo Contrast    Standing Status:   Future    Standing Expiration Date:   03/08/2023    Order Specific Question:   What is the patient's sedation requirement?    Answer:   No Sedation    Order Specific Question:   Does the patient have a pacemaker or implanted devices?    Answer:   No    Order Specific Question:   Preferred imaging location?    Answer:   [facility]/process development scientist (table limit-350lbs)   No orders of the defined types were placed in this encounter.    Discussed warning signs or symptoms. Please see discharge instructions. Patient expresses understanding.   The above documentation has been reviewed and is accurate and complete Brenda [name], M.D.

## 2022-03-07 NOTE — Patient Instructions (Signed)
Thank you for coming in today.   You should hear from MRI scheduling within 1 week. If you do not hear please let me know.    Plan for MRI.   Based on results we can move to injection or physical therapy or even surgery if needed.   Please let me know if you have problems

## 2022-03-08 ENCOUNTER — Encounter: Payer: Self-pay | Admitting: *Deleted

## 2022-03-12 ENCOUNTER — Ambulatory Visit (INDEPENDENT_AMBULATORY_CARE_PROVIDER_SITE_OTHER): Payer: Medicare HMO

## 2022-03-12 DIAGNOSIS — M25562 Pain in left knee: Secondary | ICD-10-CM

## 2022-03-12 DIAGNOSIS — M1712 Unilateral primary osteoarthritis, left knee: Secondary | ICD-10-CM | POA: Diagnosis not present

## 2022-03-12 DIAGNOSIS — M7122 Synovial cyst of popliteal space [Baker], left knee: Secondary | ICD-10-CM | POA: Diagnosis not present

## 2022-03-12 DIAGNOSIS — S8992XA Unspecified injury of left lower leg, initial encounter: Secondary | ICD-10-CM | POA: Diagnosis not present

## 2022-03-14 ENCOUNTER — Ambulatory Visit (INDEPENDENT_AMBULATORY_CARE_PROVIDER_SITE_OTHER): Payer: Medicare HMO

## 2022-03-14 ENCOUNTER — Telehealth: Payer: Self-pay | Admitting: Family Medicine

## 2022-03-14 DIAGNOSIS — D492 Neoplasm of unspecified behavior of bone, soft tissue, and skin: Secondary | ICD-10-CM

## 2022-03-14 DIAGNOSIS — M25562 Pain in left knee: Secondary | ICD-10-CM | POA: Diagnosis not present

## 2022-03-14 DIAGNOSIS — M899 Disorder of bone, unspecified: Secondary | ICD-10-CM | POA: Diagnosis not present

## 2022-03-14 NOTE — Telephone Encounter (Signed)
Ordered x-ray tib-fib

## 2022-03-14 NOTE — Telephone Encounter (Signed)
I called Brenda [name] to go over the knee MRI results.   Her knee is doing well enough that she does not want an injection.   Plan for xray of the tib/fib now to eval the lesion seen on the proximal fibula.

## 2022-03-14 NOTE — Progress Notes (Signed)
Knee MRI was dominantly shows severe arthritis changes underneath the kneecap.  This is likely causing a lot of your pain.   You do have a small meniscus tear which could be partially causing some pain as well.  There is a small Baker's cyst which I do not think is causing your pain. You do have a partially visualized benign-appearing tumor in the fibula in the lower leg which needs to be evaluated further.  Recommend return to clinic to get an x-ray of your lower leg and proceed with cortisone injection into the knee.  Will talk about your knee MRI more details as well.

## 2022-03-16 ENCOUNTER — Encounter: Payer: Self-pay | Admitting: *Deleted

## 2022-03-16 ENCOUNTER — Telehealth: Payer: Self-pay | Admitting: Family Medicine

## 2022-03-16 DIAGNOSIS — D492 Neoplasm of unspecified behavior of bone, soft tissue, and skin: Secondary | ICD-10-CM

## 2022-03-16 NOTE — Telephone Encounter (Signed)
MRI ordered to evaluate probable enchondroma on x-ray.

## 2022-03-16 NOTE — Progress Notes (Signed)
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  This is benign cartilage tumor.  We are going to do an MRI to evaluate it more accurately.  You should hear about scheduling that shortly.

## 2022-03-22 DIAGNOSIS — Z1231 Encounter for screening mammogram for malignant neoplasm of breast: Secondary | ICD-10-CM | POA: Diagnosis not present

## 2022-03-22 DIAGNOSIS — Z01419 Encounter for gynecological examination (general) (routine) without abnormal findings: Secondary | ICD-10-CM | POA: Diagnosis not present

## 2022-03-28 ENCOUNTER — Ambulatory Visit (INDEPENDENT_AMBULATORY_CARE_PROVIDER_SITE_OTHER): Payer: Medicare HMO

## 2022-03-28 DIAGNOSIS — M899 Disorder of bone, unspecified: Secondary | ICD-10-CM | POA: Diagnosis not present

## 2022-03-28 DIAGNOSIS — D492 Neoplasm of unspecified behavior of bone, soft tissue, and skin: Secondary | ICD-10-CM | POA: Diagnosis not present

## 2022-03-31 NOTE — Progress Notes (Signed)
Good news.  The weird tumor thing seen on the knee MRI is benign.  This means it is not cancer.  We can leave it alone as long as it is not bothering you.  If it starts bothering you in the outside part of your left lower knee then we can work on some specific treatment.  Recommend recheck in the new year.  Phone number is [phone]

## 2022-04-04 ENCOUNTER — Other Ambulatory Visit: Payer: Self-pay

## 2022-04-04 ENCOUNTER — Telehealth: Payer: Self-pay | Admitting: Emergency Medicine

## 2022-04-04 DIAGNOSIS — E785 Hyperlipidemia, unspecified: Secondary | ICD-10-CM

## 2022-04-04 MED ORDER — ATORVASTATIN CALCIUM 10 MG PO TABS: 10.0000 mg | ORAL_TABLET | Freq: Every day | ORAL | 3 refills | Status: DC

## 2022-04-04 NOTE — Telephone Encounter (Signed)
Caller & Relationship to patient:  patient   Call back number:[phone]   Date of last office visit: 11/2021   Date of next office visit:   Medication(s) to be refilled:  Atorvastatin        Preferred Pharmacy:

## 2022-04-04 NOTE — Telephone Encounter (Signed)
Refill has been sent in.  

## 2022-04-08 ENCOUNTER — Telehealth: Payer: Medicare HMO | Admitting: Nurse Practitioner

## 2022-04-08 DIAGNOSIS — J22 Unspecified acute lower respiratory infection: Secondary | ICD-10-CM | POA: Diagnosis not present

## 2022-04-08 MED ORDER — PROMETHAZINE-DM 6.25-15 MG/5ML PO SYRP: 5.0000 mL | ORAL_SOLUTION | Freq: Four times a day (QID) | ORAL | 0 refills | Status: DC | PRN

## 2022-04-08 MED ORDER — PREDNISONE 20 MG PO TABS: 20.0000 mg | ORAL_TABLET | Freq: Every day | ORAL | 0 refills | Status: AC

## 2022-04-08 MED ORDER — AZITHROMYCIN 250 MG PO TABS: ORAL_TABLET | ORAL | 0 refills | Status: DC

## 2022-04-08 MED ORDER — ALBUTEROL SULFATE HFA 108 (90 BASE) MCG/ACT IN AERS: 2.0000 | INHALATION_SPRAY | Freq: Two times a day (BID) | RESPIRATORY_TRACT | 0 refills | Status: DC

## 2022-04-08 NOTE — Progress Notes (Signed)
Virtual Visit Consent   Brenda [name], you are scheduled for a virtual visit with a [facility] provider today. Just as with appointments in the office, your consent must be obtained to participate. Your consent will be active for this visit and any virtual visit you may have with one of our providers in the next 365 days. If you have a MyChart account, a copy of this consent can be sent to you electronically.  As this is a virtual visit, video technology does not allow for your provider to perform a traditional examination. This may limit your provider's ability to fully assess your condition. If your provider identifies any concerns that need to be evaluated in person or the need to arrange testing (such as labs, EKG, etc.), we will make arrangements to do so. Although advances in technology are sophisticated, we cannot ensure that it will always work on either your end or our end. If the connection with a video visit is poor, the visit may have to be switched to a telephone visit. With either a video or telephone visit, we are not always able to ensure that we have a secure connection.  By engaging in this virtual visit, you consent to the provision of healthcare and authorize for your insurance to be billed (if applicable) for the services provided during this visit. Depending on your insurance coverage, you may receive a charge related to this service.  I need to obtain your verbal consent now. Are you willing to proceed with your visit today? Brenda [name] has provided verbal consent on 04/08/2022 for a virtual visit (video or telephone). Brenda Pounds, NP  Date: 04/08/2022 4:42 PM  Virtual Visit via Video Note   I, Brenda [name], connected with  Brenda [name]  ([id number], 03/12/1966) on 04/08/22 at  4:30 PM EST by a video-enabled telemedicine application and verified that I am speaking with the correct person using two identifiers.  Location: Patient: Virtual Visit Location Patient:  Home Provider: Virtual Visit Location Provider: Home Office   I discussed the limitations of evaluation and management by telemedicine and the availability of in person appointments. The patient expressed understanding and agreed to proceed.    History of Present Illness: Brenda [name] is a 71 y.o. who identifies as a female who was assigned female at birth, and is being seen today for bacterial URI symptoms.  States "I have a bad cough". Her mother was recently diagnosed with RSV. Brenda [name] herself has not been seen at any urgent care for testing of RSV or flu.  States home COVID test was negative.  Current symptoms include hacking productive cough with difficulty clearing secretions from chest, nasal congestion, rhinorrhea, fever and headache.  States she is coughing so hard it is causing stress incontinence and back pain.    Problems:  Patient Active Problem List   Diagnosis Date Noted   Allergic dermatitis 09/14/2021   Primary osteoarthritis of left shoulder 04/27/2021   Spinal stenosis 02/22/2021   Alcohol abuse 11/30/2020   Bilateral hearing loss 11/30/2020   Insomnia 11/30/2020   Mucous polyp of cervix 11/30/2020   Degenerative arthritis of knee, bilateral 03/20/2018   Prediabetes 01/17/2018   Rash and nonspecific skin eruption 12/22/2015   Arthralgia 11/17/2015   Left lumbar radiculopathy 10/25/2015   Menopausal state 08/22/2011   Hyperlipidemia 04/25/2010   HEARING LOSS 09/15/2009    Allergies:  Allergies  Allergen Reactions   Hydrocodone Other (See Comments)    drowsiness  Medications:  Current Outpatient Medications:    predniSONE (DELTASONE) 20 MG tablet, Take 1 tablet (20 mg total) by mouth daily with breakfast for 5 days., Disp: 5 tablet, Rfl: 0   promethazine-dextromethorphan (PROMETHAZINE-DM) 6.25-15 MG/5ML syrup, Take 5 mLs by mouth 4 (four) times daily as needed for cough., Disp: 118 mL, Rfl: 0   acetaminophen (TYLENOL) 500 MG tablet, Take 500 mg by mouth  every 6 (six) hours as needed (for pain.)., Disp: , Rfl:    albuterol (VENTOLIN HFA) 108 (90 Base) MCG/ACT inhaler, Inhale 2 puffs into the lungs 2 (two) times daily for 5 days., Disp: 1 each, Rfl: 0   atorvastatin (LIPITOR) 10 MG tablet, Take 1 tablet (10 mg total) by mouth daily., Disp: 90 tablet, Rfl: 3   azithromycin (ZITHROMAX) 250 MG tablet, Sig as indicated, Disp: 6 tablet, Rfl: 0   benzonatate (TESSALON) 200 MG capsule, Take 1 capsule (200 mg total) by mouth 2 (two) times daily as needed for cough., Disp: 20 capsule, Rfl: 0   cetirizine (ZYRTEC) 10 MG tablet, Take 1 tablet (10 mg total) by mouth daily for 10 days., Disp: 10 tablet, Rfl: 0   clobetasol cream (TEMOVATE) 0.05 %, APPLY SMALL AMOUNT TO AFFECTED AREA TWICE A DAY (Patient not taking: Reported on 01/12/2022), Disp: , Rfl:    cyclobenzaprine (FLEXERIL) 10 MG tablet, Take 1 tablet (10 mg total) by mouth 2 (two) times daily as needed for muscle spasms., Disp: 20 tablet, Rfl: 0   famotidine (PEPCID) 40 MG tablet, Take 1 tablet (40 mg total) by mouth daily for 7 days., Disp: 7 tablet, Rfl: 0   fluticasone (FLONASE) 50 MCG/ACT nasal spray, Place 2 sprays into both nostrils daily., Disp: 16 g, Rfl: 6   gabapentin (NEURONTIN) 100 MG capsule, Take 2 capsules (200 mg total) by mouth at bedtime., Disp: 60 capsule, Rfl: 3   HYDROcodone bit-homatropine (HYCODAN) 5-1.5 MG/5ML syrup, Take 5 mLs by mouth every 8 (eight) hours as needed for cough., Disp: 120 mL, Rfl: 0   hydrocortisone 2.5 % cream, Apply topically., Disp: , Rfl:    hydrOXYzine (ATARAX/VISTARIL) 25 MG tablet, Take 1 tablet (25 mg total) by mouth every 8 (eight) hours as needed (anxiety/itching.)., Disp: 90 tablet, Rfl: 3   metroNIDAZOLE (METROCREAM) 0.75 % cream, Apply 1 application topically in the morning and at bedtime. , Disp: , Rfl: 2   omeprazole (PRILOSEC) 20 MG capsule, Take 20 mg by mouth daily., Disp: , Rfl:    triamcinolone lotion (KENALOG) 0.1 %, SMARTSIG:1 Application  Topical Every Night PRN, Disp: , Rfl:    Vitamin D, Ergocalciferol, (DRISDOL) 1.25 MG (50000 UNIT) CAPS capsule, Take 1 capsule (50,000 Units total) by mouth every 7 (seven) days. (Patient not taking: Reported on 01/12/2022), Disp: 12 capsule, Rfl: 0  Observations/Objective: Patient is well-developed, well-nourished in no acute distress.  Resting comfortably  at home.  Head is normocephalic, atraumatic.  No labored breathing.  Speech is clear and coherent with logical content.  Patient is alert and oriented at baseline.    Assessment and Plan: 1. Lower respiratory infection - albuterol (VENTOLIN HFA) 108 (90 Base) MCG/ACT inhaler; Inhale 2 puffs into the lungs 2 (two) times daily for 5 days.  Dispense: 1 each; Refill: 0 - azithromycin (ZITHROMAX) 250 MG tablet; Sig as indicated  Dispense: 6 tablet; Refill: 0 - predniSONE (DELTASONE) 20 MG tablet; Take 1 tablet (20 mg total) by mouth daily with breakfast for 5 days.  Dispense: 5 tablet; Refill: 0 - promethazine-dextromethorphan (PROMETHAZINE-DM)  6.25-15 MG/5ML syrup; Take 5 mLs by mouth 4 (four) times daily as needed for cough.  Dispense: 118 mL; Refill: 0  INSTRUCTIONS: use a humidifier for nasal congestion Drink plenty of fluids, rest and wash hands frequently to avoid the spread of infection Alternate tylenol and Motrin for relief of fever   Follow Up Instructions: I discussed the assessment and treatment plan with the patient. The patient was provided an opportunity to ask questions and all were answered. The patient agreed with the plan and demonstrated an understanding of the instructions.  A copy of instructions were sent to the patient via MyChart unless otherwise noted below.     The patient was advised to call back or seek an in-person evaluation if the symptoms worsen or if the condition fails to improve as anticipated.  Time:  I spent 12 minutes with the patient via telehealth technology discussing the above  problems/concerns.    Brenda Pounds, NP

## 2022-04-08 NOTE — Patient Instructions (Signed)
[name], thank you for joining [name], NP for today's virtual visit.  While this provider is not your primary care provider (PCP), if your PCP is located in our provider database this encounter information will be shared with them immediately following your visit.   [facility] account gives you access to today's visit and all your visits, tests, and labs performed at [facility] " click here if you don't have a [facility] account or go to [email or web address]  Consent: (Patient) Brenda [name] provided verbal consent for this virtual visit at the beginning of the encounter.  Current Medications:  Current Outpatient Medications:    predniSONE (DELTASONE) 20 MG tablet, Take 1 tablet (20 mg total) by mouth daily with breakfast for 5 days., Disp: 5 tablet, Rfl: 0   promethazine-dextromethorphan (PROMETHAZINE-DM) 6.25-15 MG/5ML syrup, Take 5 mLs by mouth 4 (four) times daily as needed for cough., Disp: 118 mL, Rfl: 0   acetaminophen (TYLENOL) 500 MG tablet, Take 500 mg by mouth every 6 (six) hours as needed (for pain.)., Disp: , Rfl:    albuterol (VENTOLIN HFA) 108 (90 Base) MCG/ACT inhaler, Inhale 2 puffs into the lungs 2 (two) times daily for 5 days., Disp: 1 each, Rfl: 0   atorvastatin (LIPITOR) 10 MG tablet, Take 1 tablet (10 mg total) by mouth daily., Disp: 90 tablet, Rfl: 3   azithromycin (ZITHROMAX) 250 MG tablet, Sig as indicated, Disp: 6 tablet, Rfl: 0   benzonatate (TESSALON) 200 MG capsule, Take 1 capsule (200 mg total) by mouth 2 (two) times daily as needed for cough., Disp: 20 capsule, Rfl: 0   cetirizine (ZYRTEC) 10 MG tablet, Take 1 tablet (10 mg total) by mouth daily for 10 days., Disp: 10 tablet, Rfl: 0   clobetasol cream (TEMOVATE) 0.05 %, APPLY SMALL AMOUNT TO AFFECTED AREA TWICE A DAY (Patient not taking: Reported on 01/12/2022), Disp: , Rfl:    cyclobenzaprine (FLEXERIL) 10 MG tablet, Take 1 tablet (10 mg total) by mouth 2 (two)  times daily as needed for muscle spasms., Disp: 20 tablet, Rfl: 0   famotidine (PEPCID) 40 MG tablet, Take 1 tablet (40 mg total) by mouth daily for 7 days., Disp: 7 tablet, Rfl: 0   fluticasone (FLONASE) 50 MCG/ACT nasal spray, Place 2 sprays into both nostrils daily., Disp: 16 g, Rfl: 6   gabapentin (NEURONTIN) 100 MG capsule, Take 2 capsules (200 mg total) by mouth at bedtime., Disp: 60 capsule, Rfl: 3   HYDROcodone bit-homatropine (HYCODAN) 5-1.5 MG/5ML syrup, Take 5 mLs by mouth every 8 (eight) hours as needed for cough., Disp: 120 mL, Rfl: 0   hydrocortisone 2.5 % cream, Apply topically., Disp: , Rfl:    hydrOXYzine (ATARAX/VISTARIL) 25 MG tablet, Take 1 tablet (25 mg total) by mouth every 8 (eight) hours as needed (anxiety/itching.)., Disp: 90 tablet, Rfl: 3   metroNIDAZOLE (METROCREAM) 0.75 % cream, Apply 1 application topically in the morning and at bedtime. , Disp: , Rfl: 2   omeprazole (PRILOSEC) 20 MG capsule, Take 20 mg by mouth daily., Disp: , Rfl:    triamcinolone lotion (KENALOG) 0.1 %, SMARTSIG:1 Application Topical Every Night PRN, Disp: , Rfl:    Vitamin D, Ergocalciferol, (DRISDOL) 1.25 MG (50000 UNIT) CAPS capsule, Take 1 capsule (50,000 Units total) by mouth every 7 (seven) days. (Patient not taking: Reported on 01/12/2022), Disp: 12 capsule, Rfl: 0   Medications ordered in this encounter:  Meds ordered this encounter  Medications   albuterol (  VENTOLIN HFA) 108 (90 Base) MCG/ACT inhaler    Sig: Inhale 2 puffs into the lungs 2 (two) times daily for 5 days.    Dispense:  1 each    Refill:  0    Order Specific Question:   Supervising Provider    Answer:   Bari Mantis   azithromycin (ZITHROMAX) 250 MG tablet    Sig: Sig as indicated    Dispense:  6 tablet    Refill:  0    Order Specific Question:   Supervising Provider    Answer:   Chase Picket [[id number]]   predniSONE (DELTASONE) 20 MG tablet    Sig: Take 1 tablet (20 mg total) by mouth daily with  breakfast for 5 days.    Dispense:  5 tablet    Refill:  0    Order Specific Question:   Supervising Provider    Answer:   Chase Picket [id number]   promethazine-dextromethorphan (PROMETHAZINE-DM) 6.25-15 MG/5ML syrup    Sig: Take 5 mLs by mouth 4 (four) times daily as needed for cough.    Dispense:  118 mL    Refill:  0    Order Specific Question:   Supervising Provider    Answer:   Chase Picket [[id number]]     *If you need refills on other medications prior to your next appointment, please contact your pharmacy*  Follow-Up: Call back or seek an in-person evaluation if the symptoms worsen or if the condition fails to improve as anticipated.  [place] [phone]  Other Instructions INSTRUCTIONS: use a humidifier for nasal congestion Drink plenty of fluids, rest and wash hands frequently to avoid the spread of infection Alternate tylenol and Motrin for relief of fever    If you have been instructed to have an in-person evaluation today at a local Urgent Care facility, please use the link below. It will take you to a list of all of our available [facility], including address, phone number and hours of operation. Please do not delay care.  [facility]  If you or a family member do not have a primary care provider, use the link below to schedule a visit and establish care. When you choose a [facility] primary care physician or advanced practice provider, you gain a long-term partner in health. Find a Primary Care Provider  Learn more about 's in-office and virtual care options: [facility]

## 2022-04-12 ENCOUNTER — Encounter: Payer: Self-pay | Admitting: Emergency Medicine

## 2022-04-12 ENCOUNTER — Ambulatory Visit (INDEPENDENT_AMBULATORY_CARE_PROVIDER_SITE_OTHER): Payer: Medicare HMO | Admitting: Emergency Medicine

## 2022-04-12 ENCOUNTER — Ambulatory Visit (INDEPENDENT_AMBULATORY_CARE_PROVIDER_SITE_OTHER): Payer: Medicare HMO

## 2022-04-12 VITALS — BP 136/66 | HR 77 | Temp 97.7°F | Ht 66.0 in | Wt 166.0 lb

## 2022-04-12 DIAGNOSIS — J988 Other specified respiratory disorders: Secondary | ICD-10-CM

## 2022-04-12 DIAGNOSIS — B9789 Other viral agents as the cause of diseases classified elsewhere: Secondary | ICD-10-CM | POA: Diagnosis not present

## 2022-04-12 DIAGNOSIS — R059 Cough, unspecified: Secondary | ICD-10-CM | POA: Diagnosis not present

## 2022-04-12 DIAGNOSIS — R0602 Shortness of breath: Secondary | ICD-10-CM | POA: Diagnosis not present

## 2022-04-12 DIAGNOSIS — R091 Pleurisy: Secondary | ICD-10-CM

## 2022-04-12 LAB — POC COVID19 BINAXNOW: SARS Coronavirus 2 Ag: NEGATIVE

## 2022-04-12 LAB — POCT INFLUENZA A/B
Influenza A, POC: NEGATIVE
Influenza B, POC: NEGATIVE

## 2022-04-12 LAB — POCT RESPIRATORY SYNCYTIAL VIRUS: RSV Rapid Ag: NEGATIVE

## 2022-04-12 MED ORDER — HYDROCODONE BIT-HOMATROP MBR 5-1.5 MG/5ML PO SOLN: 5.0000 mL | Freq: Three times a day (TID) | ORAL | 0 refills | Status: DC | PRN

## 2022-04-12 NOTE — Assessment & Plan Note (Signed)
Pleuritic type pain worse with coughing and deep inspiration. Pain management discussed Recommend to take Hycodan syrup for cough and discomfort

## 2022-04-12 NOTE — Progress Notes (Signed)
Brenda [name] 72 y.o.   Chief Complaint  Patient presents with   Acute Visit    Cold like symptoms, patient taking ABX and prednisone, patient states when she coughs she has a sharp pain on her right side, COVID neg, symptoms started Dec 22    HISTORY OF PRESENT ILLNESS: This is a 72 y.o. female developed flulike symptoms on December 22.  Virtual video visit on 04/08/2022.  Started on azithromycin, prednisone and albuterol. Starting to feel better but still has some lingering symptoms including sharp left-sided pleuritic type of pain.  Mother was in town with her for the holidays when she was diagnosed with RSV.  Denies difficulty breathing.  Denies fever or chills.  Able to eat and drink.  Denies nausea or vomiting.  HPI   Prior to Admission medications   Medication Sig Start Date End Date Taking? Authorizing Provider  acetaminophen (TYLENOL) 500 MG tablet Take 500 mg by mouth every 6 (six) hours as needed (for pain.).   Yes [provider]  albuterol (VENTOLIN HFA) 108 (90 Base) MCG/ACT inhaler Inhale 2 puffs into the lungs 2 (two) times daily for 5 days. 04/08/22 04/13/22 Yes [name], NP  atorvastatin (LIPITOR) 10 MG tablet Take 1 tablet (10 mg total) by mouth daily. 04/04/22  Yes Horald Pollen, MD  azithromycin [facility]) 250 MG tablet Sig as indicated 04/08/22  Yes [name], NP  benzonatate (TESSALON) 200 MG capsule Take 1 capsule (200 mg total) by mouth 2 (two) times daily as needed for cough. 08/01/21  Yes Horald Pollen, MD  clobetasol cream (TEMOVATE) 0.05 %  10/14/15  Yes [provider]  cyclobenzaprine (FLEXERIL) 10 MG tablet Take 1 tablet (10 mg total) by mouth 2 (two) times daily as needed for muscle spasms. 03/05/22  Yes Cristie Hem, MD  DUPIXENT 300 MG/2ML SOPN    Yes [provider]  fluticasone (FLONASE) 50 MCG/ACT nasal spray Place 2 sprays into both nostrils daily. 07/12/21  Yes Hoyt Koch, MD   gabapentin (NEURONTIN) 100 MG capsule Take 2 capsules (200 mg total) by mouth at bedtime. 03/20/18  Yes Lyndal Pulley, DO  HYDROcodone bit-homatropine (HYCODAN) 5-1.5 MG/5ML syrup Take 5 mLs by mouth every 8 (eight) hours as needed for cough. 07/12/21  Yes Hoyt Koch, MD  hydrocortisone 2.5 % cream Apply topically. 08/19/21  Yes [provider]  hydrOXYzine (ATARAX/VISTARIL) 25 MG tablet Take 1 tablet (25 mg total) by mouth every 8 (eight) hours as needed (anxiety/itching.). 02/09/20  Yes Marrian Salvage, FNP  metroNIDAZOLE (METROCREAM) 0.75 % cream Apply 1 application topically in the morning and at bedtime.  01/03/18  Yes [provider]  omeprazole (PRILOSEC) 20 MG capsule Take 20 mg by mouth daily.   Yes [provider]  predniSONE (DELTASONE) 20 MG tablet Take 1 tablet (20 mg total) by mouth daily with breakfast for 5 days. 04/08/22 04/13/22 Yes [name], NP  promethazine-dextromethorphan (PROMETHAZINE-DM) 6.25-15 MG/5ML syrup Take 5 mLs by mouth 4 (four) times daily as needed for cough. 04/08/22  Yes [name], NP  triamcinolone lotion (KENALOG) 0.1 % SMARTSIG:1 Application Topical Every Night PRN 01/04/22  Yes [provider]  Vitamin D, Ergocalciferol, (DRISDOL) 1.25 MG (50000 UNIT) CAPS capsule Take 1 capsule (50,000 Units total) by mouth every 7 (seven) days. 05/13/19  Yes Lyndal Pulley, DO  cetirizine (ZYRTEC) 10 MG tablet Take 1 tablet (10 mg total) by mouth daily for 10  days. 11/17/21 11/27/21  Horald Pollen, MD  famotidine (PEPCID) 40 MG tablet Take 1 tablet (40 mg total) by mouth daily for 7 days. 11/17/21 11/24/21  Horald Pollen, MD    Allergies  Allergen Reactions   Hydrocodone Other (See Comments)    drowsiness    Patient Active Problem List   Diagnosis Date Noted   Allergic dermatitis 09/14/2021   Primary osteoarthritis of left shoulder 04/27/2021   Spinal stenosis 02/22/2021   Alcohol abuse  11/30/2020   Bilateral hearing loss 11/30/2020   Insomnia 11/30/2020   Mucous polyp of cervix 11/30/2020   Degenerative arthritis of knee, bilateral 03/20/2018   Prediabetes 01/17/2018   Rash and nonspecific skin eruption 12/22/2015   Arthralgia 11/17/2015   Left lumbar radiculopathy 10/25/2015   Menopausal state 08/22/2011   Hyperlipidemia 04/25/2010   HEARING LOSS 09/15/2009    Past Medical History:  Diagnosis Date   ABDOMINAL/PELVIC SWELLING MASS/LUMP UNSPEC SITE 09/18/2007   ACUTE URIS OF UNSPECIFIED SITE 06/06/2007   Allergy    Anemia    Anxiety    no meds   Arthritis of hip    08/13/2019: per patient "both hips"   Arthritis of knee, left    Arthritis of right knee    ASTHMATIC BRONCHITIS, ACUTE 05/11/2008   DM 09/18/2007   borderline -diet controlled- no meds   GERD (gastroesophageal reflux disease)    Glucose intolerance (impaired glucose tolerance)    HEARING LOSS 09/15/2009    mild -no hearing aids   HYPERLIPIDEMIA 04/25/2010   OSTEOPENIA 04/20/2007   Palpitations 03/13/2008   Pneumonia    THYROID NODULE, RIGHT 05/11/2008    Past Surgical History:  Procedure Laterality Date   BREAST BIOPSY Left 05/22/2019   BREAST BIOPSY Left 06/2020   DILATION AND CURETTAGE, DIAGNOSTIC / THERAPEUTIC     HAMMER TOE SURGERY     RIGHT   HERNIA REPAIR  1992   KNEE SURGERY  1991   Left   Left Knee cyst  1985   Lymph Node Removal     RADIOACTIVE SEED GUIDED EXCISIONAL BREAST BIOPSY Left 08/19/2019   Procedure: LEFT BREAST RADIOACTIVE SEED GUIDED EXCISIONAL BREAST BIOPSY;  Surgeon: Stark Klein, MD;  Location: [facility] OR;  Service: General;  Laterality: Left;    Social History   Socioeconomic History   Marital status: Married    Spouse name: Cedric   Number of children: 2   Years of education: masters   Highest education level: Not on file  Occupational History   Occupation: Nurse, adult: [employer]  Tobacco Use   Smoking status: Never   Smokeless  tobacco: Never  Vaping Use   Vaping Use: Never used  Substance and Sexual Activity   Alcohol use: Not Currently    Alcohol/week: 14.0 standard drinks of alcohol    Types: 14 Glasses of wine per week   Drug use: No   Sexual activity: Yes    Birth control/protection: Post-menopausal  Other Topics Concern   Not on file  Social History Narrative   Not on file   Social Determinants of Health   Financial Resource Strain: Low Risk  (01/12/2022)   Overall Financial Resource Strain (CARDIA)    Difficulty of Paying Living Expenses: Not hard at all  Food Insecurity: No Food Insecurity (01/12/2022)   Hunger Vital Sign    Worried About Running Out of Food in the Last Year: Never true    Ran Out of Food in  the Last Year: Never true  Transportation Needs: No Transportation Needs (01/12/2022)   PRAPARE - Hydrologist (Medical): No    Lack of Transportation (Non-Medical): No  Physical Activity: Sufficiently Active (01/12/2022)   Exercise Vital Sign    Days of Exercise per Week: 6 days    Minutes of Exercise per Session: 30 min  Stress: Stress Concern Present (01/12/2022)   [place]    Feeling of Stress : To some extent  Social Connections: Moderately Integrated (01/12/2022)   Social Connection and Isolation Panel [NHANES]    Frequency of Communication with Friends and Family: More than three times a week    Frequency of Social Gatherings with Friends and Family: Twice a week    Attends Religious Services: 1 to 4 times per year    Active Member of [employer] or Organizations: No    Attends Archivist Meetings: Never    Marital Status: Married  Human resources officer Violence: Not At Risk (01/12/2022)   Humiliation, Afraid, Rape, and Kick questionnaire    Fear of Current or Ex-Partner: No    Emotionally Abused: No    Physically Abused: No    Sexually Abused: No    Family History  Problem Relation  Age of Onset   Cancer Mother        Breast Cancer   Breast cancer Mother    Cancer Father        "Stomach" Cancer   Stomach cancer Father    Cancer Sister        Colon Cancer   Breast cancer Sister    Kidney disease Brother        Kidney Transplant   Colon cancer Neg Hx    Esophageal cancer Neg Hx    Rectal cancer Neg Hx      Review of Systems  Constitutional: Negative.  Negative for chills and fever.  HENT:  Positive for congestion.   Respiratory:  Positive for cough.   Cardiovascular:  Positive for chest pain.  Gastrointestinal:  Negative for abdominal pain, nausea and vomiting.  Genitourinary: Negative.   Skin: Negative.   Neurological:  Positive for headaches.   Today's Vitals   04/12/22 1013  BP: 136/66  Pulse: 77  Temp: 97.7 F (36.5 C)  TempSrc: Oral  SpO2: 96%  Weight: 166 lb (75.3 kg)  Height: '5\' 6"'$  (1.676 m)   Body mass index is 26.79 kg/m.   Physical Exam Vitals reviewed.  Constitutional:      Appearance: Normal appearance.  HENT:     Head: Normocephalic.     Right Ear: Tympanic membrane, ear canal and external ear normal.     Left Ear: Tympanic membrane, ear canal and external ear normal.     Mouth/Throat:     Mouth: Mucous membranes are moist.     Pharynx: Oropharynx is clear. No oropharyngeal exudate.  Eyes:     Extraocular Movements: Extraocular movements intact.     Conjunctiva/sclera: Conjunctivae normal.     Pupils: Pupils are equal, round, and reactive to light.  Cardiovascular:     Rate and Rhythm: Normal rate and regular rhythm.     Pulses: Normal pulses.     Heart sounds: Normal heart sounds.  Pulmonary:     Effort: Pulmonary effort is normal.     Breath sounds: Normal breath sounds.  Musculoskeletal:     Cervical back: No tenderness.  Lymphadenopathy:  Cervical: No cervical adenopathy.  Skin:    General: Skin is warm and dry.  Neurological:     General: No focal deficit present.     Mental Status: She is alert and  oriented to person, place, and time.  Psychiatric:        Mood and Affect: Mood normal.        Behavior: Behavior normal.    DG Chest 2 View  Result Date: 04/12/2022 CLINICAL DATA:  Left-sided pleurisy. Respiratory infection. Shortness of breath, cough, and congestion for 2 weeks. EXAM: CHEST - 2 VIEW COMPARISON:  Chest two views 08/01/2021 and 08/15/2017 FINDINGS: Left breast surgical clips are again noted. Cardiac silhouette and mediastinal contours are within normal limits. The lungs are clear. No pleural effusion or pneumothorax. Minimal multilevel degenerative disc changes of the thoracic spine. IMPRESSION: No active cardiopulmonary disease. Electronically Signed   By: Yvonne Kendall M.D.   On: 04/12/2022 11:29   Results for orders placed or performed in visit on 04/12/22 (from the past 24 hour(s))  POCT Influenza A/B     Status: Normal   Collection Time: 04/12/22 11:50 AM  Result Value Ref Range   Influenza A, POC Negative Negative   Influenza B, POC Negative Negative  POCT respiratory syncytial virus     Status: Normal   Collection Time: 04/12/22 11:50 AM  Result Value Ref Range   RSV Rapid Ag negative   POC COVID-19     Status: Normal   Collection Time: 04/12/22 11:50 AM  Result Value Ref Range   SARS Coronavirus 2 Ag Negative Negative     ASSESSMENT & PLAN: A total of 44 minutes was spent with the patient and counseling/coordination of care regarding preparing for this visit, review of most recent office visit notes, review of chronic medical problems, review of all medications, review of chest x-ray report done today, review of most recent emergency department visit notes, review of most recent blood work results, diagnosis of viral respiratory infection and pleurisy, pain management, prognosis, documentation, and need to follow-up with the office if no better or worse during the next several days.  Problem List Items Addressed This Visit       Respiratory   Viral respiratory  infection - Primary    Running its course without significant complication, red flag signs or symptoms. Chest x-ray done today does not show pneumonia. Recently finished course of azithromycin for 5 days Clinically stable and slowly improving Still has lingering cough and headaches.  Postviral syndrome.      Relevant Medications   HYDROcodone bit-homatropine (HYCODAN) 5-1.5 MG/5ML syrup   Other Relevant Orders   POC COVID-19   POCT respiratory syncytial virus   POCT Influenza A/B   DG Chest 2 View (Completed)   Pleurisy    Pleuritic type pain worse with coughing and deep inspiration. Pain management discussed Recommend to take Hycodan syrup for cough and discomfort      Relevant Medications   HYDROcodone bit-homatropine (HYCODAN) 5-1.5 MG/5ML syrup   Other Relevant Orders   POC COVID-19   POCT respiratory syncytial virus   POCT Influenza A/B   DG Chest 2 View (Completed)   Patient Instructions  Viral Respiratory Infection A viral respiratory infection is an illness that affects parts of the body that are used for breathing. These include the lungs, nose, and throat. It is caused by a germ called a virus. Some examples of this kind of infection are: A cold. The flu (influenza). A respiratory syncytial  virus (RSV) infection. What are the causes? This condition is caused by a virus. It spreads from person to person. You can get the virus if: You breathe in droplets from someone who is sick. You come in contact with people who are sick. You touch mucus or other fluid from a person who is sick. What are the signs or symptoms? Symptoms of this condition include: A stuffy or runny nose. A sore throat. A cough. Shortness of breath. Trouble breathing. Yellow or green fluid in the nose. Other symptoms may include: A fever. Sweating or chills. Tiredness (fatigue). Achy muscles. A headache. How is this treated? This condition may be treated with: Medicines that treat  viruses. Medicines that make it easy to breathe. Medicines that are sprayed into the nose. Acetaminophen or NSAIDs, such as ibuprofen, to treat fever. Follow these instructions at home: Managing pain and congestion Take over-the-counter and prescription medicines only as told by your doctor. If you have a sore throat, gargle with salt water. Do this 3-4 times a day or as needed. To make salt water, dissolve -1 tsp (3-6 g) of salt in 1 cup (237 mL) of warm water. Make sure that all the salt dissolves. Use nose drops made from salt water. This helps with stuffiness (congestion). It also helps soften the skin around your nose. Take 2 tsp (10 mL) of honey at bedtime to lessen coughing at night. Do not give honey to children who are younger than 37 year old. Drink enough fluid to keep your pee (urine) pale yellow. General instructions  Rest as much as possible. Do not drink alcohol. Do not smoke or use any products that contain nicotine or tobacco. If you need help quitting, ask your doctor. Keep all follow-up visits. How is this prevented?     Get a flu shot every year. Ask your doctor when you should get your flu shot. Do not let other people get your germs. If you are sick: Wash your hands with soap and water often. Wash your hands after you cough or sneeze. Wash hands for at least 20 seconds. If you cannot use soap and water, use hand sanitizer. Cover your mouth when you cough. Cover your nose and mouth when you sneeze. Do not share cups or eating utensils. Clean commonly used objects often. Clean commonly touched surfaces. Stay home from work or school. Avoid contact with people who are sick during cold and flu season. This is in fall and winter. Get help if: Your symptoms last for 10 days or longer. Your symptoms get worse over time. You have very bad pain in your face or forehead. Parts of your jaw or neck get very swollen. You have shortness of breath. Get help right away  if: You feel pain or pressure in your chest. You have trouble breathing. You faint or feel like you will faint. You keep vomiting and it gets worse. You feel confused. These symptoms may be an emergency. Get help right away. Call your local emergency services (911 in the [place] Do not wait to see if the symptoms will go away. Do not drive yourself to the hospital. Summary A viral respiratory infection is an illness that affects parts of the body that are used for breathing. Examples of this illness include a cold, the flu, and a respiratory syncytial virus (RSV) infection. The infection can cause a runny nose, cough, sore throat, and fever. Follow what your doctor tells you about taking medicines, drinking lots of fluid, washing your  hands, resting at home, and avoiding people who are sick. This information is not intended to replace advice given to you by your health care provider. Make sure you discuss any questions you have with your health care provider. Document Revised: 07/01/2020 Document Reviewed: 07/01/2020 Elsevier Patient Education  [place], MD [facility]

## 2022-04-12 NOTE — Assessment & Plan Note (Signed)
Running its course without significant complication, red flag signs or symptoms. Chest x-ray done today does not show pneumonia. Recently finished course of azithromycin for 5 days Clinically stable and slowly improving Still has lingering cough and headaches.  Postviral syndrome.

## 2022-04-12 NOTE — Patient Instructions (Signed)

## 2022-04-26 DIAGNOSIS — R69 Illness, unspecified: Secondary | ICD-10-CM | POA: Diagnosis not present

## 2022-05-05 DIAGNOSIS — R69 Illness, unspecified: Secondary | ICD-10-CM | POA: Diagnosis not present

## 2022-05-05 DIAGNOSIS — Z008 Encounter for other general examination: Secondary | ICD-10-CM | POA: Diagnosis not present

## 2022-05-05 DIAGNOSIS — K219 Gastro-esophageal reflux disease without esophagitis: Secondary | ICD-10-CM | POA: Diagnosis not present

## 2022-05-05 DIAGNOSIS — L309 Dermatitis, unspecified: Secondary | ICD-10-CM | POA: Diagnosis not present

## 2022-05-05 DIAGNOSIS — Z8249 Family history of ischemic heart disease and other diseases of the circulatory system: Secondary | ICD-10-CM | POA: Diagnosis not present

## 2022-05-05 DIAGNOSIS — Z803 Family history of malignant neoplasm of breast: Secondary | ICD-10-CM | POA: Diagnosis not present

## 2022-05-05 DIAGNOSIS — Z886 Allergy status to analgesic agent status: Secondary | ICD-10-CM | POA: Diagnosis not present

## 2022-05-05 DIAGNOSIS — Z833 Family history of diabetes mellitus: Secondary | ICD-10-CM | POA: Diagnosis not present

## 2022-05-05 DIAGNOSIS — E785 Hyperlipidemia, unspecified: Secondary | ICD-10-CM | POA: Diagnosis not present

## 2022-05-05 DIAGNOSIS — J45909 Unspecified asthma, uncomplicated: Secondary | ICD-10-CM | POA: Diagnosis not present

## 2022-05-05 DIAGNOSIS — M1990 Unspecified osteoarthritis, unspecified site: Secondary | ICD-10-CM | POA: Diagnosis not present

## 2022-05-23 DIAGNOSIS — L2089 Other atopic dermatitis: Secondary | ICD-10-CM | POA: Diagnosis not present

## 2022-06-01 DIAGNOSIS — L2089 Other atopic dermatitis: Secondary | ICD-10-CM | POA: Diagnosis not present

## 2022-06-05 DIAGNOSIS — H52223 Regular astigmatism, bilateral: Secondary | ICD-10-CM | POA: Diagnosis not present

## 2022-06-05 DIAGNOSIS — H524 Presbyopia: Secondary | ICD-10-CM | POA: Diagnosis not present

## 2022-06-08 ENCOUNTER — Other Ambulatory Visit: Payer: Self-pay | Admitting: Obstetrics and Gynecology

## 2022-06-08 DIAGNOSIS — N6320 Unspecified lump in the left breast, unspecified quadrant: Secondary | ICD-10-CM

## 2022-06-08 DIAGNOSIS — N6459 Other signs and symptoms in breast: Secondary | ICD-10-CM | POA: Diagnosis not present

## 2022-06-20 ENCOUNTER — Ambulatory Visit (INDEPENDENT_AMBULATORY_CARE_PROVIDER_SITE_OTHER): Payer: Medicare HMO | Admitting: Podiatry

## 2022-06-20 DIAGNOSIS — L84 Corns and callosities: Secondary | ICD-10-CM | POA: Diagnosis not present

## 2022-06-20 DIAGNOSIS — M2041 Other hammer toe(s) (acquired), right foot: Secondary | ICD-10-CM

## 2022-06-20 DIAGNOSIS — M2042 Other hammer toe(s) (acquired), left foot: Secondary | ICD-10-CM | POA: Diagnosis not present

## 2022-06-20 NOTE — Patient Instructions (Signed)
Look for urea 40% cream or ointment and apply to the thickened dry skin / calluses. This can be bought over the counter, at a pharmacy or online such as Amazon.    More silicone pads can be purchased from:  [email or web address]  

## 2022-06-21 ENCOUNTER — Ambulatory Visit
Admission: RE | Admit: 2022-06-21 | Discharge: 2022-06-21 | Disposition: A | Discharge status: Home / Self Care | Payer: Medicare HMO | Source: Ambulatory Visit | Attending: Obstetrics and Gynecology | Admitting: Obstetrics and Gynecology

## 2022-06-21 DIAGNOSIS — N6002 Solitary cyst of left breast: Secondary | ICD-10-CM | POA: Diagnosis not present

## 2022-06-21 DIAGNOSIS — N6320 Unspecified lump in the left breast, unspecified quadrant: Secondary | ICD-10-CM

## 2022-06-21 DIAGNOSIS — R928 Other abnormal and inconclusive findings on diagnostic imaging of breast: Secondary | ICD-10-CM | POA: Diagnosis not present

## 2022-06-21 NOTE — Progress Notes (Signed)
  Subjective:  Patient ID: Brenda [name], female    DOB: [birth year],  MRN: [id number]  Chief Complaint  Patient presents with   Callouses    Painful corn right 5th toe    72 y.o. female presents with the above complaint. History confirmed with patient.   Objective:  Physical Exam: warm, good capillary refill, no trophic changes or ulcerative lesions, normal DP and PT pulses, normal sensory exam, and painful corn lateral fifth PIPJ right she has lesser hammertoe deformities bilaterally with adductovarus rotation of the toe  Assessment:   1. Hammertoe of left foot   2. Hammertoe of right foot   3. Callus of foot      Plan:  Patient was evaluated and treated and all questions answered.  We discussed how toe deformity leads to callus formation and PIPJ corns.  The lesion was debrided as a courtesy today.  I recommended using silicone offloading pads and these were dispensed.  She will utilize these.Marland Kitchen  Return to see me if this worsens or returns in the event surgical consideration is necessary.  Return if symptoms worsen or fail to improve.

## 2022-06-26 ENCOUNTER — Telehealth: Payer: Self-pay | Admitting: *Deleted

## 2022-06-26 MED ORDER — UREA 40 % EX CREA: 1.0000 | TOPICAL_CREAM | Freq: Every day | CUTANEOUS | 2 refills | Status: AC

## 2022-06-26 NOTE — Telephone Encounter (Signed)
Spoke with patient giving instructions per physician, verbalized understanding and she said to go ahead and send it in.

## 2022-06-26 NOTE — Telephone Encounter (Signed)
Called patient and left message per physician, no answer, left message to call back if she would like a script for the Urea cream sent

## 2022-06-26 NOTE — Telephone Encounter (Signed)
Patient is calling because the pharmacist told her that she will need a prescription for the urea-40% cream,does not want to purchase thru [employer]( shipping prices),please advise.

## 2022-06-27 ENCOUNTER — Telehealth: Payer: Self-pay

## 2022-06-27 NOTE — Telephone Encounter (Signed)
Patient called the office this afternoon and states that Urea 40%  is not covered under her insurance and she is not wanting to pay $450. She wants to know if she can take anything else.

## 2022-06-27 NOTE — Telephone Encounter (Signed)
Patient called stating her prescription for Urea 40% was not sent to the pharmacy.    Please advise.Marland Kitchen

## 2022-08-10 DIAGNOSIS — L2089 Other atopic dermatitis: Secondary | ICD-10-CM | POA: Diagnosis not present

## 2022-09-28 ENCOUNTER — Telehealth: Payer: Self-pay | Admitting: Emergency Medicine

## 2022-09-28 MED ORDER — HYDROXYZINE HCL 25 MG PO TABS: 25.0000 mg | ORAL_TABLET | Freq: Three times a day (TID) | ORAL | 3 refills | Status: DC | PRN

## 2022-09-28 NOTE — Telephone Encounter (Signed)
Okay to prescribe.  Thanks

## 2022-09-28 NOTE — Telephone Encounter (Signed)
Prescription Request  09/28/2022  LOV: 04/12/2022  What is the name of the medication or equipment? hydrOXYzine (ATARAX/VISTARIL) 25 MG tablet   Have you contacted your pharmacy to request a refill? Yes   Which pharmacy would you like this sent to?  [id number] - [place], [place] - [address] [place] [address] Phone: [phone] Fax: [phone]    Patient notified that their request is being sent to the clinical staff for review and that they should receive a response within 2 business days.   Please advise at Mobile [phone] (mobile)

## 2022-09-28 NOTE — Telephone Encounter (Signed)
New prescription sent to patient pharmacy.

## 2022-10-16 ENCOUNTER — Encounter: Payer: Self-pay | Admitting: Emergency Medicine

## 2022-10-16 ENCOUNTER — Ambulatory Visit (INDEPENDENT_AMBULATORY_CARE_PROVIDER_SITE_OTHER): Payer: Medicare HMO | Admitting: Emergency Medicine

## 2022-10-16 VITALS — BP 124/74 | HR 78 | Temp 97.8°F | Ht 66.0 in | Wt 163.2 lb

## 2022-10-16 DIAGNOSIS — R051 Acute cough: Secondary | ICD-10-CM

## 2022-10-16 DIAGNOSIS — J22 Unspecified acute lower respiratory infection: Secondary | ICD-10-CM | POA: Diagnosis not present

## 2022-10-16 MED ORDER — HYDROCODONE BIT-HOMATROP MBR 5-1.5 MG/5ML PO SOLN
5.0000 mL | Freq: Every evening | ORAL | 0 refills | Status: DC | PRN
Start: 2022-10-16 — End: 2023-07-24

## 2022-10-16 MED ORDER — BENZONATATE 200 MG PO CAPS
200.0000 mg | ORAL_CAPSULE | Freq: Two times a day (BID) | ORAL | 0 refills | Status: DC | PRN
Start: 2022-10-16 — End: 2023-07-24

## 2022-10-16 MED ORDER — AZITHROMYCIN 250 MG PO TABS
ORAL_TABLET | ORAL | 0 refills | Status: DC
Start: 2022-10-16 — End: 2023-07-24

## 2022-10-16 NOTE — Assessment & Plan Note (Signed)
Upper viral infection now with lower respiratory infection Recommend daily azithromycin for 5 days Clinically stable.  No red flag signs or symptoms.

## 2022-10-16 NOTE — Assessment & Plan Note (Signed)
Cough management discussed. Recommend to take over-the-counter Mucinex DM and cough drops Advised to rest and stay well-hydrated Recommend Tessalon 200 mg 3 times a day Hycodan syrup at bedtime

## 2022-10-16 NOTE — Patient Instructions (Signed)
Acute Bronchitis, Adult  Acute bronchitis is when air tubes in the lungs (bronchi) suddenly get swollen. The condition can make it hard for you to breathe. In adults, acute bronchitis usually goes away within 2 weeks. A cough caused by bronchitis may last up to 3 weeks. Smoking, allergies, and asthma can make the condition worse. What are the causes? Germs that cause cold and flu (viruses). The most common cause of this condition is the virus that causes the common cold. Bacteria. Substances that bother (irritate) the lungs, including: Smoke from cigarettes and other types of tobacco. Dust and pollen. Fumes from chemicals, gases, or burned fuel. Indoor or outdoor air pollution. What increases the risk? A weak body's defense system. This is also called the immune system. Any condition that affects your lungs and breathing, such as asthma. What are the signs or symptoms? A cough. Coughing up clear, yellow, or green mucus. Making high-pitched whistling sounds when you breathe, most often when you breathe out (wheezing). Runny or stuffy nose. Having too much mucus in your lungs (chest congestion). Shortness of breath. Body aches. A sore throat. How is this treated? Acute bronchitis may go away over time without treatment. Your doctor may tell you to: Drink more fluids. This will help thin your mucus so it is easier to cough up. Use a device that gets medicine into your lungs (inhaler). Use a vaporizer or a humidifier. These are machines that add water to the air. This helps with coughing and poor breathing. Take a medicine that thins mucus and helps clear it from your lungs. Take a medicine that prevents or stops coughing. It is not common to take an antibiotic medicine for this condition. Follow these instructions at home:  Take over-the-counter and prescription medicines only as told by your doctor. Use an inhaler, vaporizer, or humidifier as told by your doctor. Take two teaspoons  (10 mL) of honey at bedtime. This helps lessen your coughing at night. Drink enough fluid to keep your pee (urine) pale yellow. Do not smoke or use any products that contain nicotine or tobacco. If you need help quitting, ask your doctor. Get a lot of rest. Return to your normal activities when your doctor says that it is safe. Keep all follow-up visits. How is this prevented?  Wash your hands often with soap and water for at least 20 seconds. If you cannot use soap and water, use hand sanitizer. Avoid contact with people who have cold symptoms. Try not to touch your mouth, nose, or eyes with your hands. Avoid breathing in smoke or chemical fumes. Make sure to get the flu shot every year. Contact a doctor if: Your symptoms do not get better in 2 weeks. You have trouble coughing up the mucus. Your cough keeps you awake at night. You have a fever. Get help right away if: You cough up blood. You have chest pain. You have very bad shortness of breath. You faint or keep feeling like you are going to faint. You have a very bad headache. Your fever or chills get worse. These symptoms may be an emergency. Get help right away. Call your local emergency services (911 in the [place] Do not wait to see if the symptoms will go away. Do not drive yourself to the hospital. Summary Acute bronchitis is when air tubes in the lungs (bronchi) suddenly get swollen. In adults, acute bronchitis usually goes away within 2 weeks. Drink more fluids. This will help thin your mucus so it is easier   to cough up. Take over-the-counter and prescription medicines only as told by your doctor. Contact a doctor if your symptoms do not improve after 2 weeks of treatment. This information is not intended to replace advice given to you by your health care provider. Make sure you discuss any questions you have with your health care provider. Document Revised: 07/28/2020 Document Reviewed: 07/28/2020 Elsevier Patient  Education  2024 Elsevier Inc.  

## 2022-10-16 NOTE — Progress Notes (Signed)
Brenda [name] 72 y.o.   Chief Complaint  Patient presents with   Cough    Patient states she is having a cough, patient thinks she has bronchitis, x 1 week     HISTORY OF PRESENT ILLNESS: Acute problem visit today. This is a 72 y.o. female complaining of productive cough that started about 1 week ago, progressively getting worse. No other associated symptoms No other complaints or medical concerns today.  Cough Pertinent negatives include no chest pain, headaches, rash or shortness of breath.     Prior to Admission medications   Medication Sig Start Date End Date Taking? Authorizing Provider  acetaminophen (TYLENOL) 500 MG tablet Take 500 mg by mouth every 6 (six) hours as needed (for pain.).   Yes [provider]  atorvastatin (LIPITOR) 10 MG tablet Take 1 tablet (10 mg total) by mouth daily. 04/04/22  Yes Georgina Quint, MD  clobetasol cream (TEMOVATE) 0.05 %  10/14/15  Yes [provider]  cyclobenzaprine (FLEXERIL) 10 MG tablet Take 1 tablet (10 mg total) by mouth 2 (two) times daily as needed for muscle spasms. 03/05/22  Yes Lonell Grandchild, MD  DUPIXENT 300 MG/2ML SOPN    Yes [provider]  fluticasone (FLONASE) 50 MCG/ACT nasal spray Place 2 sprays into both nostrils daily. 07/12/21  Yes Myrlene Broker, MD  gabapentin (NEURONTIN) 100 MG capsule Take 2 capsules (200 mg total) by mouth at bedtime. 03/20/18  Yes Antoine Primas M, DO  hydrocortisone 2.5 % cream Apply topically. 08/19/21  Yes [provider]  hydrOXYzine (ATARAX) 25 MG tablet Take 1 tablet (25 mg total) by mouth every 8 (eight) hours as needed (anxiety/itching.). 09/28/22  Yes Floyd Wade, Eilleen Kempf, MD  metroNIDAZOLE (METROCREAM) 0.75 % cream Apply 1 application topically in the morning and at bedtime.  01/03/18  Yes [provider]  omeprazole (PRILOSEC) 20 MG capsule Take 20 mg by mouth daily.   Yes [provider]  triamcinolone lotion (KENALOG)  0.1 % SMARTSIG:1 Application Topical Every Night PRN 01/04/22  Yes [provider]  albuterol (VENTOLIN HFA) 108 (90 Base) MCG/ACT inhaler Inhale 2 puffs into the lungs 2 (two) times daily for 5 days. 04/08/22 04/13/22  Claiborne Rigg, NP  cetirizine (ZYRTEC) 10 MG tablet Take 1 tablet (10 mg total) by mouth daily for 10 days. 11/17/21 11/27/21  Georgina Quint, MD  famotidine (PEPCID) 40 MG tablet Take 1 tablet (40 mg total) by mouth daily for 7 days. 11/17/21 11/24/21  Georgina Quint, MD  HYDROcodone bit-homatropine [facility]) 5-1.5 MG/5ML syrup Take 5 mLs by mouth every 8 (eight) hours as needed for cough. 04/12/22   Georgina Quint, MD  Vitamin D, Ergocalciferol, (DRISDOL) 1.25 MG (50000 UNIT) CAPS capsule Take 1 capsule (50,000 Units total) by mouth every 7 (seven) days. Patient not taking: Reported on 10/16/2022 05/13/19   Judi Saa, DO    Allergies  Allergen Reactions   Hydrocodone Other (See Comments)    drowsiness    Patient Active Problem List   Diagnosis Date Noted   Pleurisy 04/12/2022   Allergic dermatitis 09/14/2021   Primary osteoarthritis of left shoulder 04/27/2021   Spinal stenosis 02/22/2021   Alcohol abuse 11/30/2020   Bilateral hearing loss 11/30/2020   Insomnia 11/30/2020   Mucous polyp of cervix 11/30/2020   Degenerative arthritis of knee, bilateral 03/20/2018   Prediabetes 01/17/2018   Viral respiratory infection 08/30/2016   Rash and nonspecific skin eruption 12/22/2015   Arthralgia  11/17/2015   Left lumbar radiculopathy 10/25/2015   Menopausal state 08/22/2011   Hyperlipidemia 04/25/2010   HEARING LOSS 09/15/2009    Past Medical History:  Diagnosis Date   ABDOMINAL/PELVIC SWELLING MASS/LUMP UNSPEC SITE 09/18/2007   ACUTE URIS OF UNSPECIFIED SITE 06/06/2007   Allergy    Anemia    Anxiety    no meds   Arthritis of hip    08/13/2019: per patient "both hips"   Arthritis of knee, left    Arthritis of right knee    ASTHMATIC  BRONCHITIS, ACUTE 05/11/2008   DM 09/18/2007   borderline -diet controlled- no meds   GERD (gastroesophageal reflux disease)    Glucose intolerance (impaired glucose tolerance)    HEARING LOSS 09/15/2009    mild -no hearing aids   HYPERLIPIDEMIA 04/25/2010   OSTEOPENIA 04/20/2007   Palpitations 03/13/2008   Pneumonia    THYROID NODULE, RIGHT 05/11/2008    Past Surgical History:  Procedure Laterality Date   BREAST BIOPSY Left 05/22/2019   BREAST BIOPSY Left 06/2020   DILATION AND CURETTAGE, DIAGNOSTIC / THERAPEUTIC     HAMMER TOE SURGERY     RIGHT   HERNIA REPAIR  1992   KNEE SURGERY  1991   Left   Left Knee cyst  1985   Lymph Node Removal     RADIOACTIVE SEED GUIDED EXCISIONAL BREAST BIOPSY Left 08/19/2019   Procedure: LEFT BREAST RADIOACTIVE SEED GUIDED EXCISIONAL BREAST BIOPSY;  Surgeon: Almond Lint, MD;  Location: [facility] OR;  Service: General;  Laterality: Left;    Social History   Socioeconomic History   Marital status: Married    Spouse name: Cedric   Number of children: 2   Years of education: masters   Highest education level: Not on file  Occupational History   Occupation: Risk manager: [employer]  Tobacco Use   Smoking status: Never   Smokeless tobacco: Never  Vaping Use   Vaping Use: Never used  Substance and Sexual Activity   Alcohol use: Not Currently    Alcohol/week: 14.0 standard drinks of alcohol    Types: 14 Glasses of wine per week   Drug use: No   Sexual activity: Yes    Birth control/protection: Post-menopausal  Other Topics Concern   Not on file  Social History Narrative   Not on file   Social Determinants of Health   Financial Resource Strain: Low Risk  (01/12/2022)   Overall Financial Resource Strain (CARDIA)    Difficulty of Paying Living Expenses: Not hard at all  Food Insecurity: No Food Insecurity (01/12/2022)   Hunger Vital Sign    Worried About Radiation protection practitioner of Food in the Last Year: Never true    Ran Out of  Food in the Last Year: Never true  Transportation Needs: No Transportation Needs (01/12/2022)   PRAPARE - Administrator, Civil Service (Medical): No    Lack of Transportation (Non-Medical): No  Physical Activity: Sufficiently Active (01/12/2022)   Exercise Vital Sign    Days of Exercise per Week: 6 days    Minutes of Exercise per Session: 30 min  Stress: Stress Concern Present (01/12/2022)   [employer] of Occupational Health - Occupational Stress Questionnaire    Feeling of Stress : To some extent  Social Connections: Moderately Integrated (01/12/2022)   Social Connection and Isolation Panel [NHANES]    Frequency of Communication with Friends and Family: More than three times a week    Frequency  of Social Gatherings with Friends and Family: Twice a week    Attends Religious Services: 1 to 4 times per year    Active Member of [employer] or Organizations: No    Attends Banker Meetings: Never    Marital Status: Married  Catering manager Violence: Not At Risk (01/12/2022)   Humiliation, Afraid, Rape, and Kick questionnaire    Fear of Current or Ex-Partner: No    Emotionally Abused: No    Physically Abused: No    Sexually Abused: No    Family History  Problem Relation Age of Onset   Cancer Mother        Breast Cancer   Breast cancer Mother    Cancer Father        "Stomach" Cancer   Stomach cancer Father    Cancer Sister        Colon Cancer   Breast cancer Sister    Kidney disease Brother        Kidney Transplant   Colon cancer Neg Hx    Esophageal cancer Neg Hx    Rectal cancer Neg Hx      Review of Systems  Constitutional: Negative.   HENT:  Positive for congestion.   Respiratory:  Positive for cough and sputum production. Negative for shortness of breath.   Cardiovascular: Negative.  Negative for chest pain and palpitations.  Gastrointestinal:  Negative for abdominal pain, diarrhea, nausea and vomiting.  Genitourinary: Negative.  Negative for  dysuria and hematuria.  Skin: Negative.  Negative for rash.  Neurological:  Negative for dizziness and headaches.  All other systems reviewed and are negative.   Vitals:   10/16/22 1512  BP: 124/74  Pulse: 78  Temp: 97.8 F (36.6 C)  SpO2: 96%    Physical Exam Vitals reviewed.  Constitutional:      Appearance: Normal appearance.  HENT:     Head: Normocephalic.     Right Ear: Tympanic membrane, ear canal and external ear normal.     Left Ear: Tympanic membrane, ear canal and external ear normal.     Mouth/Throat:     Mouth: Mucous membranes are moist.     Pharynx: Oropharynx is clear.  Eyes:     Extraocular Movements: Extraocular movements intact.     Conjunctiva/sclera: Conjunctivae normal.     Pupils: Pupils are equal, round, and reactive to light.  Cardiovascular:     Rate and Rhythm: Normal rate and regular rhythm.     Pulses: Normal pulses.     Heart sounds: Normal heart sounds.  Pulmonary:     Effort: Pulmonary effort is normal.     Breath sounds: Normal breath sounds.  Musculoskeletal:     Cervical back: No tenderness.  Lymphadenopathy:     Cervical: No cervical adenopathy.  Skin:    General: Skin is warm and dry.     Capillary Refill: Capillary refill takes less than 2 seconds.  Neurological:     General: No focal deficit present.     Mental Status: She is alert and oriented to person, place, and time.  Psychiatric:        Mood and Affect: Mood normal.        Behavior: Behavior normal.      ASSESSMENT & PLAN: A total of 32 minutes was spent with the patient and counseling/coordination of care regarding preparing for this visit, review of most recent office visit notes, review of chronic medical conditions under management, review of all medications, diagnosis of  lower respiratory infection and need for antibiotics, cough management, prognosis, documentation and need for follow-up if no better or worse during the next several days.  Problem List Items  Addressed This Visit       Respiratory   Lower respiratory infection    Upper viral infection now with lower respiratory infection Recommend daily azithromycin for 5 days Clinically stable.  No red flag signs or symptoms.      Relevant Medications   azithromycin (ZITHROMAX) 250 MG tablet     Other   Acute cough - Primary    Cough management discussed. Recommend to take over-the-counter Mucinex DM and cough drops Advised to rest and stay well-hydrated Recommend Tessalon 200 mg 3 times a day Hycodan syrup at bedtime      Relevant Medications   benzonatate (TESSALON) 200 MG capsule   HYDROcodone bit-homatropine (HYCODAN) 5-1.5 MG/5ML syrup   Patient Instructions  Acute Bronchitis, Adult  Acute bronchitis is when air tubes in the lungs (bronchi) suddenly get swollen. The condition can make it hard for you to breathe. In adults, acute bronchitis usually goes away within 2 weeks. A cough caused by bronchitis may last up to 3 weeks. Smoking, allergies, and asthma can make the condition worse. What are the causes? Germs that cause cold and flu (viruses). The most common cause of this condition is the virus that causes the common cold. Bacteria. Substances that bother (irritate) the lungs, including: Smoke from cigarettes and other types of tobacco. Dust and pollen. Fumes from chemicals, gases, or burned fuel. Indoor or outdoor air pollution. What increases the risk? A weak body's defense system. This is also called the immune system. Any condition that affects your lungs and breathing, such as asthma. What are the signs or symptoms? A cough. Coughing up clear, yellow, or green mucus. Making high-pitched whistling sounds when you breathe, most often when you breathe out (wheezing). Runny or stuffy nose. Having too much mucus in your lungs (chest congestion). Shortness of breath. Body aches. A sore throat. How is this treated? Acute bronchitis may go away over time without  treatment. Your doctor may tell you to: Drink more fluids. This will help thin your mucus so it is easier to cough up. Use a device that gets medicine into your lungs (inhaler). Use a vaporizer or a humidifier. These are machines that add water to the air. This helps with coughing and poor breathing. Take a medicine that thins mucus and helps clear it from your lungs. Take a medicine that prevents or stops coughing. It is not common to take an antibiotic medicine for this condition. Follow these instructions at home:  Take over-the-counter and prescription medicines only as told by your doctor. Use an inhaler, vaporizer, or humidifier as told by your doctor. Take two teaspoons (10 mL) of honey at bedtime. This helps lessen your coughing at night. Drink enough fluid to keep your pee (urine) pale yellow. Do not smoke or use any products that contain nicotine or tobacco. If you need help quitting, ask your doctor. Get a lot of rest. Return to your normal activities when your doctor says that it is safe. Keep all follow-up visits. How is this prevented?  Wash your hands often with soap and water for at least 20 seconds. If you cannot use soap and water, use hand sanitizer. Avoid contact with people who have cold symptoms. Try not to touch your mouth, nose, or eyes with your hands. Avoid breathing in smoke or chemical fumes. Make  sure to get the flu shot every year. Contact a doctor if: Your symptoms do not get better in 2 weeks. You have trouble coughing up the mucus. Your cough keeps you awake at night. You have a fever. Get help right away if: You cough up blood. You have chest pain. You have very bad shortness of breath. You faint or keep feeling like you are going to faint. You have a very bad headache. Your fever or chills get worse. These symptoms may be an emergency. Get help right away. Call your local emergency services (911 in the [place] Do not wait to see if the symptoms  will go away. Do not drive yourself to the hospital. Summary Acute bronchitis is when air tubes in the lungs (bronchi) suddenly get swollen. In adults, acute bronchitis usually goes away within 2 weeks. Drink more fluids. This will help thin your mucus so it is easier to cough up. Take over-the-counter and prescription medicines only as told by your doctor. Contact a doctor if your symptoms do not improve after 2 weeks of treatment. This information is not intended to replace advice given to you by your health care provider. Make sure you discuss any questions you have with your health care provider. Document Revised: 07/28/2020 Document Reviewed: 07/28/2020 Elsevier Patient Education  2024 Elsevier Inc.    Edwina Barth, MD [facility]

## 2022-10-23 ENCOUNTER — Telehealth: Payer: Self-pay | Admitting: Emergency Medicine

## 2022-10-23 NOTE — Telephone Encounter (Signed)
I saw Brenda [name] on 10/16/2022 when she was diagnosed with a lower respiratory infection.  She was started on antibiotics.  Hycodan syrup for cough was only for couple of days.  Refill not warranted at this time.

## 2022-10-23 NOTE — Telephone Encounter (Signed)
Prescription Request  10/23/2022  LOV: 10/16/2022  What is the name of the medication or equipment? Hydrocodone   - Patient was only given a 7 day supply instead of the 24 day supply - this will need to be sent back in.  Have you contacted your pharmacy to request a refill? Yes   Which pharmacy would you like this sent to?  [id number] - [place], [place] - [address] [place] [address] Phone: [phone] Fax: [phone]  J Patient notified that their request is being sent to the clinical staff for review and that they should receive a response within 2 business days.   Please advise at Mobile [phone] (mobile)

## 2022-10-24 NOTE — Telephone Encounter (Signed)
 Called patient and informed her of provider response 

## 2022-11-14 DIAGNOSIS — B0089 Other herpesviral infection: Secondary | ICD-10-CM | POA: Diagnosis not present

## 2022-11-14 DIAGNOSIS — L810 Postinflammatory hyperpigmentation: Secondary | ICD-10-CM | POA: Diagnosis not present

## 2022-11-14 DIAGNOSIS — L2089 Other atopic dermatitis: Secondary | ICD-10-CM | POA: Diagnosis not present

## 2023-01-04 ENCOUNTER — Ambulatory Visit: Payer: Medicare HMO | Admitting: Emergency Medicine

## 2023-01-05 ENCOUNTER — Ambulatory Visit (INDEPENDENT_AMBULATORY_CARE_PROVIDER_SITE_OTHER): Payer: Medicare HMO | Admitting: Nurse Practitioner

## 2023-01-05 ENCOUNTER — Ambulatory Visit (INDEPENDENT_AMBULATORY_CARE_PROVIDER_SITE_OTHER): Payer: Medicare HMO

## 2023-01-05 VITALS — BP 112/72 | HR 85 | Temp 97.7°F | Ht 66.0 in | Wt 165.1 lb

## 2023-01-05 DIAGNOSIS — R109 Unspecified abdominal pain: Secondary | ICD-10-CM | POA: Diagnosis not present

## 2023-01-05 DIAGNOSIS — R140 Abdominal distension (gaseous): Secondary | ICD-10-CM | POA: Insufficient documentation

## 2023-01-05 DIAGNOSIS — R1012 Left upper quadrant pain: Secondary | ICD-10-CM

## 2023-01-05 LAB — COMPREHENSIVE METABOLIC PANEL
ALT: 13 U/L (ref 0–35)
AST: 16 U/L (ref 0–37)
Albumin: 4 g/dL (ref 3.5–5.2)
Alkaline Phosphatase: 72 U/L (ref 39–117)
BUN: 21 mg/dL (ref 6–23)
CO2: 31 meq/L (ref 19–32)
Calcium: 9.4 mg/dL (ref 8.4–10.5)
Chloride: 106 meq/L (ref 96–112)
Creatinine, Ser: 0.79 mg/dL (ref 0.40–1.20)
GFR: 76.03 mL/min (ref >60.00)
Glucose, Bld: 84 mg/dL (ref 70–99)
Potassium: 4.3 meq/L (ref 3.5–5.1)
Sodium: 143 meq/L (ref 135–145)
Total Bilirubin: 0.4 mg/dL (ref 0.2–1.2)
Total Protein: 6.8 g/dL (ref 6.0–8.3)

## 2023-01-05 LAB — CBC
HCT: 41.5 % (ref 36.0–46.0)
Hemoglobin: 12.8 g/dL (ref 12.0–15.0)
MCHC: 30.8 g/dL (ref 30.0–36.0)
MCV: 74.1 fL — ABNORMAL LOW (ref 78.0–100.0)
Platelets: 228 10*3/uL (ref 150.0–400.0)
RBC: 5.59 Mil/uL — ABNORMAL HIGH (ref 3.87–5.11)
RDW: 15.7 % — ABNORMAL HIGH (ref 11.5–15.5)
WBC: 6.9 10*3/uL (ref 4.0–10.5)

## 2023-01-05 NOTE — Assessment & Plan Note (Signed)
Subacute Etiology unclear Check CBC, CMP, abdominal x-ray Continue famotidine, try simethicone over-the-counter for management of symptoms Order abdominal ultrasound as well, question whether she may have ascites. Refer to gastroenterology for further assistance with evaluation and management. Close follow-up recommended with PCP in 1 month as well

## 2023-01-05 NOTE — Patient Instructions (Addendum)
Simethicone - can take as needed per package instructions to treat gas/bloating

## 2023-01-05 NOTE — Progress Notes (Signed)
Established Patient Office Visit  Subjective   Patient ID: Brenda [name], female    DOB: 1950/11/09  Age: 72 y.o. MRN: [id number]  Chief Complaint  Patient presents with   Abdominal Pain    Bloating, been going on for a month, having difficulty burping it out. Worse after eating.     Brenda [name] comes in today for the above concerns. She is a 72 year old female with past medical history significant for GERD, hyperlipidemia, asthmatic bronchitis, anxiety, osteoarthritis, alcohol abuse.  She arrives today because for the last month she has been experiencing abdominal bloating and a burning pain that comes and goes to the left upper quadrant of her abdomen.  Eating is a trigger.  Physical exertion and activity does not worsen or improve the pain.  She reports having at least 2 daily bowel movements.  Does not feel constipated.  No nausea or vomiting.  Has taken famotidine 40 mg daily chronically for GERD.  Has had a history of undergoing endoscopy (2012) and colonoscopy (2017) which identified diverticula, but otherwise normal.  Metabolic panel collected about 18 months ago identifies normal AST, ALT, alkaline phosphatase, and kidney function. Her father was diagnosed with stomach cancer.    ROS: See HPI    Objective:     BP 112/72   Pulse 85   Temp 97.7 F (36.5 C) (Temporal)   Ht 5\' 6"  (1.676 m)   Wt 165 lb 2 oz (74.9 kg)   SpO2 96%   BMI 26.65 kg/m  BP Readings from Last 3 Encounters:  01/05/23 112/72  10/16/22 124/74  04/12/22 136/66   Wt Readings from Last 3 Encounters:  01/05/23 165 lb 2 oz (74.9 kg)  10/16/22 163 lb 4 oz (74 kg)  04/12/22 166 lb (75.3 kg)      Physical Exam Vitals reviewed.  Constitutional:      General: She is not in acute distress.    Appearance: Normal appearance.  HENT:     Head: Normocephalic and atraumatic.  Neck:     Vascular: No carotid bruit.  Cardiovascular:     Rate and Rhythm: Normal rate and regular rhythm.     Pulses: Normal  pulses.     Heart sounds: Normal heart sounds.  Pulmonary:     Effort: Pulmonary effort is normal.     Breath sounds: Normal breath sounds.  Abdominal:     General: Abdomen is protuberant. Bowel sounds are normal. There is distension.     Palpations: There is no hepatomegaly, splenomegaly or mass.     Tenderness: There is no abdominal tenderness.  Skin:    General: Skin is warm and dry.  Neurological:     General: No focal deficit present.     Mental Status: She is alert and oriented to person, place, and time.  Psychiatric:        Mood and Affect: Mood normal.        Behavior: Behavior normal.        Judgment: Judgment normal.    EKG: NSR with rate of 75  No results found for any visits on 01/05/23.    The 10-year ASCVD risk score (Arnett DK, et al., 2019) is: 7.3%    Assessment & Plan:   Problem List Items Addressed This Visit       Other   Bloating - Primary    Subacute Etiology unclear Check CBC, CMP, abdominal x-ray Continue famotidine, try simethicone over-the-counter for management of symptoms Order abdominal ultrasound as  well, question whether she may have ascites. Refer to gastroenterology for further assistance with evaluation and management. Close follow-up recommended with PCP in 1 month as well      Relevant Orders   Ambulatory referral to Gastroenterology   DG Abd 2 Views   Comprehensive metabolic panel   CBC   US Abdomen Complete   LUQ abdominal pain    Subacute Etiology unclear Check CBC, CMP, abdominal x-ray Continue famotidine, try simethicone over-the-counter for management of symptoms Order abdominal ultrasound as well, question whether she may have ascites. Refer to gastroenterology for further assistance with evaluation and management. Close follow-up recommended with PCP in 1 month as well      Relevant Orders   EKG 12-Lead   DG Abd 2 Views   Comprehensive metabolic panel   CBC   US Abdomen Complete    Return in about 1  month (around 02/04/2023) for F/U with PCP.    Elenore Paddy, NP

## 2023-01-10 ENCOUNTER — Other Ambulatory Visit: Payer: Medicare HMO

## 2023-01-12 ENCOUNTER — Ambulatory Visit
Admission: RE | Admit: 2023-01-12 | Discharge: 2023-01-12 | Disposition: A | Discharge status: Home / Self Care | Payer: Medicare HMO | Source: Ambulatory Visit | Attending: Nurse Practitioner | Admitting: Nurse Practitioner

## 2023-01-12 DIAGNOSIS — R140 Abdominal distension (gaseous): Secondary | ICD-10-CM | POA: Diagnosis not present

## 2023-01-12 DIAGNOSIS — R109 Unspecified abdominal pain: Secondary | ICD-10-CM | POA: Diagnosis not present

## 2023-01-12 DIAGNOSIS — K838 Other specified diseases of biliary tract: Secondary | ICD-10-CM | POA: Diagnosis not present

## 2023-01-12 DIAGNOSIS — R1012 Left upper quadrant pain: Secondary | ICD-10-CM

## 2023-01-24 ENCOUNTER — Encounter: Payer: Self-pay | Admitting: Emergency Medicine

## 2023-01-24 ENCOUNTER — Ambulatory Visit: Payer: Medicare HMO | Admitting: Emergency Medicine

## 2023-01-24 VITALS — BP 128/78 | HR 96 | Temp 98.0°F | Wt 166.6 lb

## 2023-01-24 DIAGNOSIS — R140 Abdominal distension (gaseous): Secondary | ICD-10-CM | POA: Diagnosis not present

## 2023-01-24 DIAGNOSIS — Z23 Encounter for immunization: Secondary | ICD-10-CM | POA: Diagnosis not present

## 2023-01-24 NOTE — Patient Instructions (Signed)
Health Maintenance After Age 70 After age 67, you are at a higher risk for certain long-term diseases and infections as well as injuries from falls. Falls are a major cause of broken bones and head injuries in people who are older than age 35. Getting regular preventive care can help to keep you healthy and well. Preventive care includes getting regular testing and making lifestyle changes as recommended by your health care provider. Talk with your health care provider about: Which screenings and tests you should have. A screening is a test that checks for a disease when you have no symptoms. A diet and exercise plan that is right for you. What should I know about screenings and tests to prevent falls? Screening and testing are the best ways to find a health problem early. Early diagnosis and treatment give you the best chance of managing medical conditions that are common after age 66. Certain conditions and lifestyle choices may make you more likely to have a fall. Your health care provider may recommend: Regular vision checks. Poor vision and conditions such as cataracts can make you more likely to have a fall. If you wear glasses, make sure to get your prescription updated if your vision changes. Medicine review. Work with your health care provider to regularly review all of the medicines you are taking, including over-the-counter medicines. Ask your health care provider about any side effects that may make you more likely to have a fall. Tell your health care provider if any medicines that you take make you feel dizzy or sleepy. Strength and balance checks. Your health care provider may recommend certain tests to check your strength and balance while standing, walking, or changing positions. Foot health exam. Foot pain and numbness, as well as not wearing proper footwear, can make you more likely to have a fall. Screenings, including: Osteoporosis screening. Osteoporosis is a condition that causes  the bones to get weaker and break more easily. Blood pressure screening. Blood pressure changes and medicines to control blood pressure can make you feel dizzy. Depression screening. You may be more likely to have a fall if you have a fear of falling, feel depressed, or feel unable to do activities that you used to do. Alcohol use screening. Using too much alcohol can affect your balance and may make you more likely to have a fall. Follow these instructions at home: Lifestyle Do not drink alcohol if: Your health care provider tells you not to drink. If you drink alcohol: Limit how much you have to: 0-1 drink a day for women. 0-2 drinks a day for men. Know how much alcohol is in your drink. In the [place] one drink equals one 12 oz bottle of beer (355 mL), one 5 oz glass of wine (148 mL), or one 1 oz glass of hard liquor (44 mL). Do not use any products that contain nicotine or tobacco. These products include cigarettes, chewing tobacco, and vaping devices, such as e-cigarettes. If you need help quitting, ask your health care provider. Activity  Follow a regular exercise program to stay fit. This will help you maintain your balance. Ask your health care provider what types of exercise are appropriate for you. If you need a cane or walker, use it as recommended by your health care provider. Wear supportive shoes that have nonskid soles. Safety  Remove any tripping hazards, such as rugs, cords, and clutter. Install safety equipment such as grab bars in bathrooms and safety rails on stairs. Keep rooms and walkways  well-lit. General instructions Talk with your health care provider about your risks for falling. Tell your health care provider if: You fall. Be sure to tell your health care provider about all falls, even ones that seem minor. You feel dizzy, tiredness (fatigue), or off-balance. Take over-the-counter and prescription medicines only as told by your health care provider. These include  supplements. Eat a healthy diet and maintain a healthy weight. A healthy diet includes low-fat dairy products, low-fat (lean) meats, and fiber from whole grains, beans, and lots of fruits and vegetables. Stay current with your vaccines. Schedule regular health, dental, and eye exams. Summary Having a healthy lifestyle and getting preventive care can help to protect your health and wellness after age 59. Screening and testing are the best way to find a health problem early and help you avoid having a fall. Early diagnosis and treatment give you the best chance for managing medical conditions that are more common for people who are older than age 1. Falls are a major cause of broken bones and head injuries in people who are older than age 49. Take precautions to prevent a fall at home. Work with your health care provider to learn what changes you can make to improve your health and wellness and to prevent falls. This information is not intended to replace advice given to you by your health care provider. Make sure you discuss any questions you have with your health care provider. Document Revised: 08/16/2020 Document Reviewed: 08/16/2020 Elsevier Patient Education  2024 [employer].

## 2023-01-24 NOTE — Assessment & Plan Note (Signed)
Clinically stable without complications. Benign abdominal examination. No red flag signs or symptoms. Recent diagnostic workup reviewed with patient Abdominal ultrasound report still pending Normal EKG and abdominal x-rays.  Unremarkable blood work. Patient asymptomatic at present time Diet and nutrition discussed.  Advised to decrease amount of daily carbohydrate intake and daily calories and increase amount of plant-based protein in her diet. Continue daily omeprazole 20 mg and liquid antacids as needed Advised to contact the office if no better or worse during the next several weeks

## 2023-01-24 NOTE — Progress Notes (Signed)
Brenda [name] 72 y.o.   Chief Complaint  Patient presents with   Bloated    1 month follow up for bloating. PT notes gas has gotten better but still presenting bloated. PT also [facility]. Would like to review most recent ultasound, EKG, x-ray    HISTORY OF PRESENT ILLNESS: This is a 72 y.o. female here for follow-up of office visit 01/05/2023 when she was seen by Jiles Prows for abdominal bloating Negative abdominal x-rays.  Unremarkable blood work.  Normal EKG. Had abdominal ultrasound on 01/12/2023 but report not available yet Patient has been taking omeprazole 20 mg daily and liquid antacids as needed with good results. Asymptomatic at present time.  HPI   Prior to Admission medications   Medication Sig Start Date End Date Taking? Authorizing Provider  acetaminophen (TYLENOL) 500 MG tablet Take 500 mg by mouth every 6 (six) hours as needed (for pain.).   Yes [provider]  atorvastatin (LIPITOR) 10 MG tablet Take 1 tablet (10 mg total) by mouth daily. 04/04/22  Yes Lanijah Warzecha, Eilleen Kempf, MD  azithromycin [facility]) 250 MG tablet Sig as indicated 10/16/22  Yes Demarquis Osley, Eilleen Kempf, MD  benzonatate (TESSALON) 200 MG capsule Take 1 capsule (200 mg total) by mouth 2 (two) times daily as needed for cough. 10/16/22  Yes Georgina Quint, MD  clobetasol cream (TEMOVATE) 0.05 %  10/14/15  Yes [provider]  cyclobenzaprine (FLEXERIL) 10 MG tablet Take 1 tablet (10 mg total) by mouth 2 (two) times daily as needed for muscle spasms. 03/05/22  Yes Lonell Grandchild, MD  DUPIXENT 300 MG/2ML SOPN    Yes [provider]  fluticasone (FLONASE) 50 MCG/ACT nasal spray Place 2 sprays into both nostrils daily. 07/12/21  Yes Myrlene Broker, MD  gabapentin (NEURONTIN) 100 MG capsule Take 2 capsules (200 mg total) by mouth at bedtime. 03/20/18  Yes Judi Saa, DO  HYDROcodone bit-homatropine (HYCODAN) 5-1.5 MG/5ML  syrup Take 5 mLs by mouth at bedtime as needed for cough. 10/16/22  Yes SagardiaEilleen Kempf, MD  hydrocortisone 2.5 % cream Apply topically. 08/19/21  Yes [provider]  hydrOXYzine (ATARAX) 25 MG tablet Take 1 tablet (25 mg total) by mouth every 8 (eight) hours as needed (anxiety/itching.). 09/28/22  Yes Aldyn Toon, Eilleen Kempf, MD  metroNIDAZOLE (METROCREAM) 0.75 % cream Apply 1 application topically in the morning and at bedtime.  01/03/18  Yes [provider]  omeprazole (PRILOSEC) 20 MG capsule Take 20 mg by mouth daily.   Yes [provider]  triamcinolone lotion (KENALOG) 0.1 % SMARTSIG:1 Application Topical Every Night PRN 01/04/22  Yes [provider]  albuterol (VENTOLIN HFA) 108 (90 Base) MCG/ACT inhaler Inhale 2 puffs into the lungs 2 (two) times daily for 5 days. 04/08/22 04/13/22  Claiborne Rigg, NP  cetirizine (ZYRTEC) 10 MG tablet Take 1 tablet (10 mg total) by mouth daily for 10 days. 11/17/21 11/27/21  Georgina Quint, MD  famotidine (PEPCID) 40 MG tablet Take 1 tablet (40 mg total) by mouth daily for 7 days. 11/17/21 11/24/21  Georgina Quint, MD  Vitamin D, Ergocalciferol, (DRISDOL) 1.25 MG (50000 UNIT) CAPS capsule Take 1 capsule (50,000 Units total) by mouth every 7 (seven) days. Patient not taking: Reported on 01/24/2023 05/13/19   Judi Saa, DO    Allergies  Allergen Reactions   Hydrocodone Other (See Comments)    drowsiness    Patient Active Problem  List   Diagnosis Date Noted   Bloating 01/05/2023   LUQ abdominal pain 01/05/2023   Lower respiratory infection 10/16/2022   Pleurisy 04/12/2022   Allergic dermatitis 09/14/2021   Primary osteoarthritis of left shoulder 04/27/2021   Spinal stenosis 02/22/2021   Alcohol abuse 11/30/2020   Bilateral hearing loss 11/30/2020   Insomnia 11/30/2020   Mucous polyp of cervix 11/30/2020   Degenerative arthritis of knee, bilateral 03/20/2018   Prediabetes 01/17/2018   Acute  cough 01/22/2017   Viral respiratory infection 08/30/2016   Arthralgia 11/17/2015   Left lumbar radiculopathy 10/25/2015   Menopausal state 08/22/2011   Hyperlipidemia 04/25/2010   HEARING LOSS 09/15/2009    Past Medical History:  Diagnosis Date   ABDOMINAL/PELVIC SWELLING MASS/LUMP UNSPEC SITE 09/18/2007   ACUTE URIS OF UNSPECIFIED SITE 06/06/2007   Allergy    Anemia    Anxiety    no meds   Arthritis of hip    08/13/2019: per patient "both hips"   Arthritis of knee, left    Arthritis of right knee    ASTHMATIC BRONCHITIS, ACUTE 05/11/2008   DM 09/18/2007   borderline -diet controlled- no meds   GERD (gastroesophageal reflux disease)    Glucose intolerance (impaired glucose tolerance)    HEARING LOSS 09/15/2009    mild -no hearing aids   HYPERLIPIDEMIA 04/25/2010   OSTEOPENIA 04/20/2007   Palpitations 03/13/2008   Pneumonia    THYROID NODULE, RIGHT 05/11/2008    Past Surgical History:  Procedure Laterality Date   BREAST BIOPSY Left 05/22/2019   BREAST BIOPSY Left 06/2020   DILATION AND CURETTAGE, DIAGNOSTIC / THERAPEUTIC     HAMMER TOE SURGERY     RIGHT   HERNIA REPAIR  1992   KNEE SURGERY  1991   Left   Left Knee cyst  1985   Lymph Node Removal     RADIOACTIVE SEED GUIDED EXCISIONAL BREAST BIOPSY Left 08/19/2019   Procedure: LEFT BREAST RADIOACTIVE SEED GUIDED EXCISIONAL BREAST BIOPSY;  Surgeon: Almond Lint, MD;  Location: [facility] OR;  Service: General;  Laterality: Left;    Social History   Socioeconomic History   Marital status: Married    Spouse name: Cedric   Number of children: 2   Years of education: masters   Highest education level: Not on file  Occupational History   Occupation: Risk manager: [employer]  Tobacco Use   Smoking status: Never   Smokeless tobacco: Never  Vaping Use   Vaping status: Never Used  Substance and Sexual Activity   Alcohol use: Not Currently    Alcohol/week: 14.0 standard drinks of alcohol    Types: 14  Glasses of wine per week   Drug use: No   Sexual activity: Yes    Birth control/protection: Post-menopausal  Other Topics Concern   Not on file  Social History Narrative   Not on file   Social Determinants of Health   Financial Resource Strain: Low Risk  (01/12/2022)   Overall Financial Resource Strain (CARDIA)    Difficulty of Paying Living Expenses: Not hard at all  Food Insecurity: No Food Insecurity (01/12/2022)   Hunger Vital Sign    Worried About Running Out of Food in the Last Year: Never true    Ran Out of Food in the Last Year: Never true  Transportation Needs: No Transportation Needs (01/12/2022)   PRAPARE - Administrator, Civil Service (Medical): No    Lack of Transportation (Non-Medical): No  Physical Activity: Sufficiently Active (01/12/2022)   Exercise Vital Sign    Days of Exercise per Week: 6 days    Minutes of Exercise per Session: 30 min  Stress: Stress Concern Present (01/12/2022)   [employer] of Occupational Health - Occupational Stress Questionnaire    Feeling of Stress : To some extent  Social Connections: Moderately Integrated (01/12/2022)   Social Connection and Isolation Panel [NHANES]    Frequency of Communication with Friends and Family: More than three times a week    Frequency of Social Gatherings with Friends and Family: Twice a week    Attends Religious Services: 1 to 4 times per year    Active Member of [employer] or Organizations: No    Attends Banker Meetings: Never    Marital Status: Married  Catering manager Violence: Not At Risk (01/12/2022)   Humiliation, Afraid, Rape, and Kick questionnaire    Fear of Current or Ex-Partner: No    Emotionally Abused: No    Physically Abused: No    Sexually Abused: No    Family History  Problem Relation Age of Onset   Cancer Mother        Breast Cancer   Breast cancer Mother    Cancer Father        "Stomach" Cancer   Stomach cancer Father    Cancer Sister        Colon  Cancer   Breast cancer Sister    Kidney disease Brother        Kidney Transplant   Colon cancer Neg Hx    Esophageal cancer Neg Hx    Rectal cancer Neg Hx      Review of Systems  Constitutional: Negative.  Negative for chills and fever.  HENT: Negative.  Negative for congestion and sore throat.   Respiratory: Negative.  Negative for cough and shortness of breath.   Cardiovascular: Negative.  Negative for chest pain and palpitations.  Gastrointestinal:  Negative for abdominal pain, diarrhea, nausea and vomiting.  Genitourinary: Negative.  Negative for dysuria and hematuria.  Skin: Negative.  Negative for rash.  Neurological: Negative.  Negative for dizziness and headaches.  All other systems reviewed and are negative.   Vitals:   01/24/23 1451  BP: 128/78  Pulse: 96  Temp: 98 F (36.7 C)  SpO2: 96%    Physical Exam Vitals reviewed.  Constitutional:      Appearance: Normal appearance.  HENT:     Head: Normocephalic.     Mouth/Throat:     Mouth: Mucous membranes are moist.     Pharynx: Oropharynx is clear.  Eyes:     Extraocular Movements: Extraocular movements intact.     Pupils: Pupils are equal, round, and reactive to light.  Cardiovascular:     Rate and Rhythm: Normal rate and regular rhythm.     Pulses: Normal pulses.     Heart sounds: Normal heart sounds.  Pulmonary:     Effort: Pulmonary effort is normal.     Breath sounds: Normal breath sounds.  Abdominal:     General: There is no distension.     Palpations: Abdomen is soft.     Tenderness: There is no abdominal tenderness.  Musculoskeletal:     Cervical back: No tenderness.  Lymphadenopathy:     Cervical: No cervical adenopathy.  Skin:    General: Skin is warm and dry.     Capillary Refill: Capillary refill takes less than 2 seconds.  Neurological:  General: No focal deficit present.     Mental Status: She is alert and oriented to person, place, and time.  Psychiatric:        Mood and Affect:  Mood normal.        Behavior: Behavior normal.      ASSESSMENT & PLAN: A total of 32 minutes was spent with the patient and counseling/coordination of care regarding preparing for this visit, review of most recent office visit notes, review of most recent blood work results, review of most recent abdominal x-rays, review of all medications, education on nutrition, prognosis, documentation, need for follow-up.  Problem List Items Addressed This Visit       Other   Abdominal bloating - Primary    Clinically stable without complications. Benign abdominal examination. No red flag signs or symptoms. Recent diagnostic workup reviewed with patient Abdominal ultrasound report still pending Normal EKG and abdominal x-rays.  Unremarkable blood work. Patient asymptomatic at present time Diet and nutrition discussed.  Advised to decrease amount of daily carbohydrate intake and daily calories and increase amount of plant-based protein in her diet. Continue daily omeprazole 20 mg and liquid antacids as needed Advised to contact the office if no better or worse during the next several weeks      Other Visit Diagnoses     Need for immunization against influenza       Relevant Orders   Flu Vaccine Trivalent High Dose (Fluad) (Completed)      Patient Instructions  Health Maintenance After Age 59 After age 49, you are at a higher risk for certain long-term diseases and infections as well as injuries from falls. Falls are a major cause of broken bones and head injuries in people who are older than age 45. Getting regular preventive care can help to keep you healthy and well. Preventive care includes getting regular testing and making lifestyle changes as recommended by your health care provider. Talk with your health care provider about: Which screenings and tests you should have. A screening is a test that checks for a disease when you have no symptoms. A diet and exercise plan that is right for  you. What should I know about screenings and tests to prevent falls? Screening and testing are the best ways to find a health problem early. Early diagnosis and treatment give you the best chance of managing medical conditions that are common after age 62. Certain conditions and lifestyle choices may make you more likely to have a fall. Your health care provider may recommend: Regular vision checks. Poor vision and conditions such as cataracts can make you more likely to have a fall. If you wear glasses, make sure to get your prescription updated if your vision changes. Medicine review. Work with your health care provider to regularly review all of the medicines you are taking, including over-the-counter medicines. Ask your health care provider about any side effects that may make you more likely to have a fall. Tell your health care provider if any medicines that you take make you feel dizzy or sleepy. Strength and balance checks. Your health care provider may recommend certain tests to check your strength and balance while standing, walking, or changing positions. Foot health exam. Foot pain and numbness, as well as not wearing proper footwear, can make you more likely to have a fall. Screenings, including: Osteoporosis screening. Osteoporosis is a condition that causes the bones to get weaker and break more easily. Blood pressure screening. Blood pressure changes and medicines  to control blood pressure can make you feel dizzy. Depression screening. You may be more likely to have a fall if you have a fear of falling, feel depressed, or feel unable to do activities that you used to do. Alcohol use screening. Using too much alcohol can affect your balance and may make you more likely to have a fall. Follow these instructions at home: Lifestyle Do not drink alcohol if: Your health care provider tells you not to drink. If you drink alcohol: Limit how much you have to: 0-1 drink a day for women. 0-2  drinks a day for men. Know how much alcohol is in your drink. In the [place] one drink equals one 12 oz bottle of beer (355 mL), one 5 oz glass of wine (148 mL), or one 1 oz glass of hard liquor (44 mL). Do not use any products that contain nicotine or tobacco. These products include cigarettes, chewing tobacco, and vaping devices, such as e-cigarettes. If you need help quitting, ask your health care provider. Activity  Follow a regular exercise program to stay fit. This will help you maintain your balance. Ask your health care provider what types of exercise are appropriate for you. If you need a cane or walker, use it as recommended by your health care provider. Wear supportive shoes that have nonskid soles. Safety  Remove any tripping hazards, such as rugs, cords, and clutter. Install safety equipment such as grab bars in bathrooms and safety rails on stairs. Keep rooms and walkways well-lit. General instructions Talk with your health care provider about your risks for falling. Tell your health care provider if: You fall. Be sure to tell your health care provider about all falls, even ones that seem minor. You feel dizzy, tiredness (fatigue), or off-balance. Take over-the-counter and prescription medicines only as told by your health care provider. These include supplements. Eat a healthy diet and maintain a healthy weight. A healthy diet includes low-fat dairy products, low-fat (lean) meats, and fiber from whole grains, beans, and lots of fruits and vegetables. Stay current with your vaccines. Schedule regular health, dental, and eye exams. Summary Having a healthy lifestyle and getting preventive care can help to protect your health and wellness after age 79. Screening and testing are the best way to find a health problem early and help you avoid having a fall. Early diagnosis and treatment give you the best chance for managing medical conditions that are more common for people who are  older than age 48. Falls are a major cause of broken bones and head injuries in people who are older than age 75. Take precautions to prevent a fall at home. Work with your health care provider to learn what changes you can make to improve your health and wellness and to prevent falls. This information is not intended to replace advice given to you by your health care provider. Make sure you discuss any questions you have with your health care provider. Document Revised: 08/16/2020 Document Reviewed: 08/16/2020 Elsevier Patient Education  2024 Elsevier Inc.    Edwina Barth, MD [facility]

## 2023-01-25 ENCOUNTER — Telehealth: Payer: Self-pay | Admitting: *Deleted

## 2023-01-25 NOTE — Telephone Encounter (Signed)
Called patient and left message for her to call office in reference to forms that was dropped off yesterday.

## 2023-03-28 DIAGNOSIS — Z1231 Encounter for screening mammogram for malignant neoplasm of breast: Secondary | ICD-10-CM | POA: Diagnosis not present

## 2023-03-28 DIAGNOSIS — Z01419 Encounter for gynecological examination (general) (routine) without abnormal findings: Secondary | ICD-10-CM | POA: Diagnosis not present

## 2023-03-28 DIAGNOSIS — Z6827 Body mass index (BMI) 27.0-27.9, adult: Secondary | ICD-10-CM | POA: Diagnosis not present

## 2023-04-05 ENCOUNTER — Other Ambulatory Visit: Payer: Self-pay | Admitting: Emergency Medicine

## 2023-04-05 DIAGNOSIS — E785 Hyperlipidemia, unspecified: Secondary | ICD-10-CM

## 2023-05-17 ENCOUNTER — Encounter: Payer: Self-pay | Admitting: Gastroenterology

## 2023-05-17 ENCOUNTER — Ambulatory Visit (INDEPENDENT_AMBULATORY_CARE_PROVIDER_SITE_OTHER): Payer: No Typology Code available for payment source | Admitting: Gastroenterology

## 2023-05-17 VITALS — BP 118/70 | HR 85 | Ht 66.0 in | Wt 165.0 lb

## 2023-05-17 DIAGNOSIS — K760 Fatty (change of) liver, not elsewhere classified: Secondary | ICD-10-CM | POA: Diagnosis not present

## 2023-05-17 DIAGNOSIS — R140 Abdominal distension (gaseous): Secondary | ICD-10-CM | POA: Diagnosis not present

## 2023-05-17 NOTE — Patient Instructions (Signed)
 You have been scheduled for an endoscopy. Please follow written instructions given to you at your visit today.  If you use inhalers (even only as needed), please bring them with you on the day of your procedure.  If you take any of the following medications, they will need to be adjusted prior to your procedure:   DO NOT TAKE 7 DAYS PRIOR TO TEST- Trulicity (dulaglutide) Ozempic, Wegovy (semaglutide) Mounjaro (tirzepatide) Bydureon Bcise (exanatide extended release)  DO NOT TAKE 1 DAY PRIOR TO YOUR TEST Rybelsus (semaglutide) Adlyxin (lixisenatide) Victoza (liraglutide) Byetta (exanatide) ___________________________________________________________________________   Due to recent changes in healthcare laws, you may see the results of your imaging and laboratory studies on MyChart before your provider has had a chance to review them.  We understand that in some cases there may be results that are confusing or concerning to you. Not all laboratory results come back in the same time frame and the provider may be waiting for multiple results in order to interpret others.  Please give [place] 48 hours in order for your provider to thoroughly review all the results before contacting the office for clarification of your results. \  I appreciate the  opportunity to care for you  Thank You   Marsa Aris , MD

## 2023-05-17 NOTE — Progress Notes (Signed)
 Brenda [name]    [id number]    Aug 10, 1950  Primary Care Physician:Sagardia, Emil Schanz, MD  Referring Physician: Purcell Emil Schanz, MD [address] [place],  [place] [address]   Chief complaint:  Bloating  Discussed the use of AI scribe software for clinical note transcription with the patient, who gave verbal consent to proceed.  History of Present Illness   Brenda [name] is a 74 year old female who presents with bloating and upper abdominal discomfort.  She experiences bloating and discomfort in the upper abdominal region, describing the sensation as feeling 'locked up' with difficulty expelling gas. These symptoms have persisted since the fall of last year. She has attempted to manage her symptoms by avoiding carbohydrates, sodas, and sweet drinks, and by using a juicer to make juice. Despite these efforts, the symptoms persist. Over-the-counter gas medication provides temporary relief but does not completely resolve the symptoms.  An ultrasound previously revealed a fatty liver, but no other significant findings.      Abdominal ultrasound January 12, 2023 1. Heterogeneous appearance of the liver. Clinical correlation recommended for possible underlying cirrhosis. 2. CBD dilatation. MRCP could be done to evaluate for potential etiologies.  Outpatient Encounter Medications as of 05/17/2023  Medication Sig   acetaminophen  (TYLENOL ) 500 MG tablet Take 500 mg by mouth every 6 (six) hours as needed (for pain.).   albuterol  (VENTOLIN  HFA) 108 (90 Base) MCG/ACT inhaler Inhale 2 puffs into the lungs 2 (two) times daily for 5 days.   atorvastatin  (LIPITOR) 10 MG tablet Take 1 tablet by mouth once daily   azithromycin  (ZITHROMAX ) 250 MG tablet Sig as indicated   benzonatate  (TESSALON ) 200 MG capsule Take 1 capsule (200 mg total) by mouth 2 (two) times daily as needed for cough.   cetirizine  (ZYRTEC ) 10 MG tablet Take 1 tablet (10 mg total) by mouth daily for 10 days.    clobetasol cream (TEMOVATE) 0.05 %    cyclobenzaprine  (FLEXERIL ) 10 MG tablet Take 1 tablet (10 mg total) by mouth 2 (two) times daily as needed for muscle spasms.   DUPIXENT 300 MG/2ML SOPN    famotidine  (PEPCID ) 40 MG tablet Take 1 tablet (40 mg total) by mouth daily for 7 days.   fluticasone  (FLONASE ) 50 MCG/ACT nasal spray Place 2 sprays into both nostrils daily.   gabapentin  (NEURONTIN ) 100 MG capsule Take 2 capsules (200 mg total) by mouth at bedtime.   HYDROcodone  bit-homatropine (HYCODAN) 5-1.5 MG/5ML syrup Take 5 mLs by mouth at bedtime as needed for cough.   hydrocortisone 2.5 % cream Apply topically.   hydrOXYzine  (ATARAX ) 25 MG tablet Take 1 tablet (25 mg total) by mouth every 8 (eight) hours as needed (anxiety/itching.).   metroNIDAZOLE (METROCREAM) 0.75 % cream Apply 1 application topically in the morning and at bedtime.    omeprazole  (PRILOSEC) 20 MG capsule Take 20 mg by mouth daily.   triamcinolone  lotion (KENALOG ) 0.1 % SMARTSIG:1 Application Topical Every Night PRN   Vitamin D , Ergocalciferol , (DRISDOL ) 1.25 MG (50000 UNIT) CAPS capsule Take 1 capsule (50,000 Units total) by mouth every 7 (seven) days. (Patient not taking: Reported on 01/24/2023)   No facility-administered encounter medications on file as of 05/17/2023.    Allergies as of 05/17/2023 - Review Complete 05/17/2023  Allergen Reaction Noted   Hydrocodone  Other (See Comments)     Past Medical History:  Diagnosis Date   ABDOMINAL/PELVIC SWELLING MASS/LUMP UNSPEC SITE 09/18/2007   ACUTE  URIS OF UNSPECIFIED SITE 06/06/2007   Allergy    Anemia    Anxiety    no meds   Arthritis of hip    08/13/2019: per patient both hips   Arthritis of knee, left    Arthritis of right knee    ASTHMATIC BRONCHITIS, ACUTE 05/11/2008   DM 09/18/2007   borderline -diet controlled- no meds   GERD (gastroesophageal reflux disease)    Glucose intolerance (impaired glucose tolerance)    HEARING LOSS 09/15/2009    mild -no hearing  aids   HYPERLIPIDEMIA 04/25/2010   OSTEOPENIA 04/20/2007   Palpitations 03/13/2008   Pneumonia    THYROID  NODULE, RIGHT 05/11/2008    Past Surgical History:  Procedure Laterality Date   BREAST BIOPSY Left 05/22/2019   BREAST BIOPSY Left 06/2020   DILATION AND CURETTAGE, DIAGNOSTIC / THERAPEUTIC     HAMMER TOE SURGERY     RIGHT   HERNIA REPAIR  1992   KNEE SURGERY  1991   Left   Left Knee cyst  1985   Lymph Node Removal     RADIOACTIVE SEED GUIDED EXCISIONAL BREAST BIOPSY Left 08/19/2019   Procedure: LEFT BREAST RADIOACTIVE SEED GUIDED EXCISIONAL BREAST BIOPSY;  Surgeon: Aron Shoulders, MD;  Location: [facility] OR;  Service: General;  Laterality: Left;    Family History  Problem Relation Age of Onset   Cancer Mother        Breast Cancer   Breast cancer Mother    Cancer Father        Stomach Cancer   Stomach cancer Father    Cancer Sister        Colon Cancer   Breast cancer Sister    Kidney disease Brother        Kidney Transplant   Colon cancer Neg Hx    Esophageal cancer Neg Hx    Rectal cancer Neg Hx     Social History   Socioeconomic History   Marital status: Married    Spouse name: Cedric   Number of children: 2   Years of education: masters   Highest education level: Not on file  Occupational History   Occupation: Risk Manager: [employer]  Tobacco Use   Smoking status: Never   Smokeless tobacco: Never  Vaping Use   Vaping status: Never Used  Substance and Sexual Activity   Alcohol use: Not Currently    Alcohol/week: 14.0 standard drinks of alcohol    Types: 14 Glasses of wine per week   Drug use: No   Sexual activity: Yes    Birth control/protection: Post-menopausal  Other Topics Concern   Not on file  Social History Narrative   Not on file   Social Drivers of Health   Financial Resource Strain: Low Risk  (01/12/2022)   Overall Financial Resource Strain (CARDIA)    Difficulty of Paying Living Expenses: Not hard at all   Food Insecurity: No Food Insecurity (01/12/2022)   Hunger Vital Sign    Worried About Radiation Protection Practitioner of Food in the Last Year: Never true    Ran Out of Food in the Last Year: Never true  Transportation Needs: No Transportation Needs (01/12/2022)   PRAPARE - Administrator, Civil Service (Medical): No    Lack of Transportation (Non-Medical): No  Physical Activity: Sufficiently Active (01/12/2022)   Exercise Vital Sign    Days of Exercise per Week: 6 days    Minutes of Exercise per Session: 30  min  Stress: Stress Concern Present (01/12/2022)   [employer] of Occupational Health - Occupational Stress Questionnaire    Feeling of Stress : To some extent  Social Connections: Moderately Integrated (01/12/2022)   Social Connection and Isolation Panel [NHANES]    Frequency of Communication with Friends and Family: More than three times a week    Frequency of Social Gatherings with Friends and Family: Twice a week    Attends Religious Services: 1 to 4 times per year    Active Member of [employer] or Organizations: No    Attends Banker Meetings: Never    Marital Status: Married  Catering Manager Violence: Not At Risk (01/12/2022)   Humiliation, Afraid, Rape, and Kick questionnaire    Fear of Current or Ex-Partner: No    Emotionally Abused: No    Physically Abused: No    Sexually Abused: No      Review of systems: All other review of systems negative except as mentioned in the HPI.   Physical Exam: Vitals:   05/17/23 1104  BP: 118/70  Pulse: 85   Body mass index is 26.63 kg/m. Gen:      No acute distress HEENT:  sclera anicteric CV: s1s2 rrr, no murmur Lungs: B/l clear. Abd:      soft, non-tender; no palpable masses, no distension Ext:    No edema Neuro: alert and oriented x 3 Psych: normal mood and affect  Data Reviewed:  Reviewed labs, radiology imaging, old records and pertinent past GI work up     Assessment and Plan    Upper Abdominal  Bloating and Discomfort   Intermittent upper abdominal bloating and discomfort, likely due to gastrointestinal issues. Differential diagnosis includes acid reflux, dietary causes, or hiatal hernia. Symptoms have been present since the fall. Gas medicine provides temporary relief but symptoms recur. Discussed the need for an upper endoscopy to evaluate for ulcers, changes in the lining, or other abnormalities. Emphasized the importance of ruling out serious conditions such as ulcers or tumors before focusing on symptom management. Cosmetic surgery is not recommended as it may worsen symptoms due to scar tissue formation.   - Perform upper endoscopy to evaluate for ulcers, changes in the lining, or other abnormalities   - Provide samples of a natural digestive aid to be taken up to three times a day   - Schedule upper endoscopy for Monday, May 21, 2023, at 3:00 PM   - Instruct to fast from solid food for eight hours before the procedure and to stop drinking clear liquids two hours before the procedure    Fatty Liver   Based on clinical exam and labs, no features of portal hypertension or cirrhosis Mild fatty liver identified on ultrasound, associated with metabolic syndrome. Explained that fatty liver results from fat accumulation in the liver and is managed primarily through dietary and lifestyle changes. Discussed the benefits of avoiding carbohydrates, consuming fresh fruits and vegetables, exercising, losing weight, and avoiding alcohol. Mentioned that there are medications for fatty liver but they have side effects. Recommended considering over-the-counter Vitamin E as it may help with fatty liver.   - Recommend dietary and lifestyle changes including avoiding carbohydrates, consuming fresh fruits and vegetables, exercising, losing weight, and avoiding alcohol   - Consider taking over-the-counter Vitamin E daily to help with fatty liver     General Health Maintenance   Discussed the importance  of diet, exercise, and lifestyle modifications for overall health and management of fatty liver.   -  Encourage regular exercise and a balanced diet   - Advise on the importance of weight loss and avoiding alcohol    Follow-up   - Follow-up after the upper endoscopy to discuss findings and next steps.          This visit required 40 minutes of patient care (this includes precharting, chart review, review of results, face-to-face time used for counseling as well as treatment plan and follow-up. The patient was provided an opportunity to ask questions and all were answered. The patient agreed with the plan and demonstrated an understanding of the instructions.  Brenda [name] , MD    CC: Purcell Emil Schanz, [place]

## 2023-05-18 ENCOUNTER — Encounter: Payer: Self-pay | Admitting: Gastroenterology

## 2023-05-21 ENCOUNTER — Encounter: Payer: Self-pay | Admitting: Gastroenterology

## 2023-05-21 ENCOUNTER — Ambulatory Visit: Payer: No Typology Code available for payment source | Admitting: Gastroenterology

## 2023-05-21 VITALS — BP 133/83 | HR 76 | Temp 97.4°F | Resp 18 | Ht 66.0 in | Wt 165.0 lb

## 2023-05-21 DIAGNOSIS — R140 Abdominal distension (gaseous): Secondary | ICD-10-CM

## 2023-05-21 DIAGNOSIS — K2100 Gastro-esophageal reflux disease with esophagitis, without bleeding: Secondary | ICD-10-CM | POA: Diagnosis present

## 2023-05-21 DIAGNOSIS — K2980 Duodenitis without bleeding: Secondary | ICD-10-CM

## 2023-05-21 DIAGNOSIS — K2970 Gastritis, unspecified, without bleeding: Secondary | ICD-10-CM

## 2023-05-21 DIAGNOSIS — K3189 Other diseases of stomach and duodenum: Secondary | ICD-10-CM | POA: Diagnosis not present

## 2023-05-21 DIAGNOSIS — R1011 Right upper quadrant pain: Secondary | ICD-10-CM

## 2023-05-21 DIAGNOSIS — K219 Gastro-esophageal reflux disease without esophagitis: Secondary | ICD-10-CM

## 2023-05-21 MED ORDER — SODIUM CHLORIDE 0.9 % IV SOLN: 500.0000 mL | Freq: Once | INTRAVENOUS | Status: DC

## 2023-05-21 MED ORDER — PANTOPRAZOLE SODIUM 40 MG PO TBEC: 40.0000 mg | DELAYED_RELEASE_TABLET | Freq: Every day | ORAL | 3 refills | Status: DC

## 2023-05-21 NOTE — Progress Notes (Signed)
Called to room to assist during endoscopic procedure.  Patient ID and intended procedure confirmed with present staff. Received instructions for my participation in the procedure from the performing physician.

## 2023-05-21 NOTE — Op Note (Signed)
 [facility] Patient Name: Brenda [name] Procedure Date: 05/21/2023 2:20 PM MRN: [id number] Endoscopist: Sergio Dandy , MD, [id number] Age: 73 Referring MD:  Date of Birth: 10-29-1950 Gender: Female Account #: [id number] Procedure:                Upper GI endoscopy Indications:              Upper abdominal pain, Dyspepsia, Abdominal bloating Medicines:                Monitored Anesthesia Care Procedure:                Pre-Anesthesia Assessment:                           - Prior to the procedure, a History and Physical                            was performed, and patient medications and                            allergies were reviewed. The patient's tolerance of                            previous anesthesia was also reviewed. The risks                            and benefits of the procedure and the sedation                            options and risks were discussed with the patient.                            All questions were answered, and informed consent                            was obtained. Prior Anticoagulants: The patient has                            taken no anticoagulant or antiplatelet agents. ASA                            Grade Assessment: II - A patient with mild systemic                            disease. After reviewing the risks and benefits,                            the patient was deemed in satisfactory condition to                            undergo the procedure.                           After obtaining informed consent, the endoscope was  passed under direct vision. Throughout the                            procedure, the patient's blood pressure, pulse, and                            oxygen saturations were monitored continuously. The                            GIF [id number] #[id number] was introduced through the                            mouth, and advanced to the second part of duodenum.                             The upper GI endoscopy was accomplished without                            difficulty. The patient tolerated the procedure                            well. Scope In: Scope Out: Findings:                 LA Grade B (one or more mucosal breaks greater than                            5 mm, not extending between the tops of two mucosal                            folds) esophagitis with no bleeding was found 34 to                            35 cm from the incisors.                           The gastroesophageal flap valve was visualized                            endoscopically and classified as Hill Grade II                            (fold present, opens with respiration).                           Patchy mild inflammation characterized by                            congestion (edema), erythema and friability was                            found in the cardia, in the gastric antrum and in  the prepyloric region of the stomach. Biopsies were                            taken with a cold forceps for Helicobacter pylori                            testing.                           The cardia and gastric fundus were normal on                            retroflexion.                           Patchy mildly erythematous mucosa without active                            bleeding and with no stigmata of bleeding was found                            in the first portion of the duodenum and in the                            second portion of the duodenum. Biopsies for                            histology were taken with a cold forceps for                            evaluation of celiac disease. Complications:            No immediate complications. Estimated Blood Loss:     Estimated blood loss was minimal. Impression:               - LA Grade B reflux esophagitis with no bleeding.                           - Gastroesophageal flap valve classified as Hill                             Grade II (fold present, opens with respiration).                           - Gastritis. Biopsied.                           - Erythematous duodenopathy. Biopsied. Recommendation:           - Patient has a contact number available for                            emergencies. The signs and symptoms of potential                            delayed complications were discussed  with the                            patient. Return to normal activities tomorrow.                            Written discharge instructions were provided to the                            patient.                           - Resume previous diet.                           - Continue present medications.                           - Await pathology results.                           - Follow an antireflux regimen.                           - Use Protonix  (pantoprazole ) 40 mg PO daily. Rx                            for 90 days with 3 refills                           - Follow up in GI office in 3-4 months Phillippe Orlick V. Javarian Jakubiak, MD 05/21/2023 3:19:10 PM This report has been signed electronically.

## 2023-05-21 NOTE — Patient Instructions (Addendum)
 Resume previous diet and medications. Awaiting pathology results. Follow an antireflux regimen. Use Protonix  40 mg PO daily.  Follow up in GI office in 3-4 months  YOU HAD AN ENDOSCOPIC PROCEDURE TODAY AT THE [facility]:   Refer to the procedure report that was given to you for any specific questions about what was found during the examination.  If the procedure report does not answer your questions, please call your gastroenterologist to clarify.  If you requested that your care partner not be given the details of your procedure findings, then the procedure report has been included in a sealed envelope for you to review at your convenience later.  YOU SHOULD EXPECT: Some feelings of bloating in the abdomen. Passage of more gas than usual.  Walking can help get rid of the air that was put into your GI tract during the procedure and reduce the bloating. If you had a lower endoscopy (such as a colonoscopy or flexible sigmoidoscopy) you may notice spotting of blood in your stool or on the toilet paper. If you underwent a bowel prep for your procedure, you may not have a normal bowel movement for a few days.  Please Note:  You might notice some irritation and congestion in your nose or some drainage.  This is from the oxygen used during your procedure.  There is no need for concern and it should clear up in a day or so.  SYMPTOMS TO REPORT IMMEDIATELY:  Following upper endoscopy (EGD)  Vomiting of blood or coffee ground material  New chest pain or pain under the shoulder blades  Painful or persistently difficult swallowing  New shortness of breath  Fever of 100F or higher  Black, tarry-looking stools  For urgent or emergent issues, a gastroenterologist can be reached at any hour by calling [phone]. Do not use MyChart messaging for urgent concerns.    DIET:  We do recommend a small meal at first, but then you may proceed to your regular diet.  Drink plenty of fluids but you  should avoid alcoholic beverages for 24 hours.  ACTIVITY:  You should plan to take it easy for the rest of today and you should NOT DRIVE or use heavy machinery until tomorrow (because of the sedation medicines used during the test).    FOLLOW UP: Our staff will call the number listed on your records the next business day following your procedure.  We will call around 7:15- 8:00 am to check on you and address any questions or concerns that you may have regarding the information given to you following your procedure. If we do not reach you, we will leave a message.     If any biopsies were taken you will be contacted by phone or by letter within the next 1-3 weeks.  Please call us  at [phone] if you have not heard about the biopsies in 3 weeks.    SIGNATURES/CONFIDENTIALITY: You and/or your care partner have signed paperwork which will be entered into your electronic medical record.  These signatures attest to the fact that that the information above on your After Visit Summary has been reviewed and is understood.  Full responsibility of the confidentiality of this discharge information lies with you and/or your care-partner.

## 2023-05-21 NOTE — Progress Notes (Signed)
Pt's states no medical or surgical changes since previsit or office visit.

## 2023-05-21 NOTE — Progress Notes (Signed)
Please refer to office visit note 05/17/23. No additional changes in H&P Patient is appropriate for planned procedure(s) and anesthesia in an ambulatory setting  K. Scherry Ran , MD [phone]

## 2023-05-21 NOTE — Progress Notes (Signed)
Vss nad trans to pacu

## 2023-05-22 ENCOUNTER — Telehealth: Payer: Self-pay

## 2023-05-22 NOTE — Telephone Encounter (Signed)
  Follow up Call-     05/21/2023    2:48 PM  Call back number  Post procedure Call Back phone  # [phone]  Permission to leave phone message Yes     Patient questions:  Do you have a fever, pain , or abdominal swelling? No. Pain Score  0 *  Have you tolerated food without any problems? Yes.    Have you been able to return to your normal activities? Yes.    Do you have any questions about your discharge instructions: Diet   No. Medications  No. Follow up visit  No.  Do you have questions or concerns about your Care? No.  Actions: * If pain score is 4 or above: No action needed, pain <4.

## 2023-05-24 LAB — SURGICAL PATHOLOGY

## 2023-07-08 ENCOUNTER — Other Ambulatory Visit: Payer: Self-pay | Admitting: Emergency Medicine

## 2023-07-08 DIAGNOSIS — E785 Hyperlipidemia, unspecified: Secondary | ICD-10-CM

## 2023-07-12 ENCOUNTER — Encounter: Payer: Self-pay | Admitting: Gastroenterology

## 2023-07-24 ENCOUNTER — Encounter: Payer: Self-pay | Admitting: Emergency Medicine

## 2023-07-24 ENCOUNTER — Ambulatory Visit (INDEPENDENT_AMBULATORY_CARE_PROVIDER_SITE_OTHER): Payer: Medicare Other | Admitting: Emergency Medicine

## 2023-07-24 ENCOUNTER — Ambulatory Visit: Payer: Medicare Other

## 2023-07-24 VITALS — BP 118/62 | HR 64 | Ht 65.0 in | Wt 165.2 lb

## 2023-07-24 VITALS — BP 128/74 | HR 77 | Temp 98.4°F | Ht 65.0 in | Wt 166.0 lb

## 2023-07-24 DIAGNOSIS — Z1329 Encounter for screening for other suspected endocrine disorder: Secondary | ICD-10-CM | POA: Diagnosis not present

## 2023-07-24 DIAGNOSIS — Z13228 Encounter for screening for other metabolic disorders: Secondary | ICD-10-CM | POA: Diagnosis not present

## 2023-07-24 DIAGNOSIS — Z130 Encounter for screening for diseases of the blood and blood-forming organs and certain disorders involving the immune mechanism: Secondary | ICD-10-CM

## 2023-07-24 DIAGNOSIS — M4800 Spinal stenosis, site unspecified: Secondary | ICD-10-CM | POA: Diagnosis not present

## 2023-07-24 DIAGNOSIS — E785 Hyperlipidemia, unspecified: Secondary | ICD-10-CM | POA: Diagnosis not present

## 2023-07-24 DIAGNOSIS — K2100 Gastro-esophageal reflux disease with esophagitis, without bleeding: Secondary | ICD-10-CM

## 2023-07-24 DIAGNOSIS — Z0000 Encounter for general adult medical examination without abnormal findings: Secondary | ICD-10-CM | POA: Diagnosis not present

## 2023-07-24 DIAGNOSIS — Z13220 Encounter for screening for lipoid disorders: Secondary | ICD-10-CM

## 2023-07-24 DIAGNOSIS — J22 Unspecified acute lower respiratory infection: Secondary | ICD-10-CM | POA: Diagnosis not present

## 2023-07-24 DIAGNOSIS — Z0001 Encounter for general adult medical examination with abnormal findings: Secondary | ICD-10-CM

## 2023-07-24 DIAGNOSIS — R7303 Prediabetes: Secondary | ICD-10-CM | POA: Diagnosis not present

## 2023-07-24 LAB — LIPID PANEL
Cholesterol: 183 mg/dL (ref 0–200)
HDL: 52.7 mg/dL (ref >39.00)
LDL Cholesterol: 90 mg/dL (ref 0–99)
NonHDL: 130.48
Total CHOL/HDL Ratio: 3
Triglycerides: 204 mg/dL — ABNORMAL HIGH (ref 0.0–149.0)
VLDL: 40.8 mg/dL — ABNORMAL HIGH (ref 0.0–40.0)

## 2023-07-24 LAB — COMPREHENSIVE METABOLIC PANEL WITH GFR
ALT: 14 U/L (ref 0–35)
AST: 17 U/L (ref 0–37)
Albumin: 4.2 g/dL (ref 3.5–5.2)
Alkaline Phosphatase: 70 U/L (ref 39–117)
BUN: 23 mg/dL (ref 6–23)
CO2: 30 meq/L (ref 19–32)
Calcium: 9.2 mg/dL (ref 8.4–10.5)
Chloride: 106 meq/L (ref 96–112)
Creatinine, Ser: 0.64 mg/dL (ref 0.40–1.20)
GFR: 89.48 mL/min (ref >60.00)
Glucose, Bld: 108 mg/dL — ABNORMAL HIGH (ref 70–99)
Potassium: 3.6 meq/L (ref 3.5–5.1)
Sodium: 139 meq/L (ref 135–145)
Total Bilirubin: 0.3 mg/dL (ref 0.2–1.2)
Total Protein: 6.6 g/dL (ref 6.0–8.3)

## 2023-07-24 LAB — CBC WITH DIFFERENTIAL/PLATELET
Basophils Absolute: 0.1 10*3/uL (ref 0.0–0.1)
Basophils Relative: 1.2 % (ref 0.0–3.0)
Eosinophils Absolute: 0.2 10*3/uL (ref 0.0–0.7)
Eosinophils Relative: 3.1 % (ref 0.0–5.0)
HCT: 40.4 % (ref 36.0–46.0)
Hemoglobin: 12.6 g/dL (ref 12.0–15.0)
Lymphocytes Relative: 36.3 % (ref 12.0–46.0)
Lymphs Abs: 2.4 10*3/uL (ref 0.7–4.0)
MCHC: 31.1 g/dL (ref 30.0–36.0)
MCV: 74.1 fl — ABNORMAL LOW (ref 78.0–100.0)
Monocytes Absolute: 0.7 10*3/uL (ref 0.1–1.0)
Monocytes Relative: 11.4 % (ref 3.0–12.0)
Neutro Abs: 3.1 10*3/uL (ref 1.4–7.7)
Neutrophils Relative %: 48 % (ref 43.0–77.0)
Platelets: 217 10*3/uL (ref 150.0–400.0)
RBC: 5.46 Mil/uL — ABNORMAL HIGH (ref 3.87–5.11)
RDW: 15.4 % (ref 11.5–15.5)
WBC: 6.5 10*3/uL (ref 4.0–10.5)

## 2023-07-24 LAB — HEMOGLOBIN A1C: Hgb A1c MFr Bld: 6.1 % (ref 4.6–6.5)

## 2023-07-24 MED ORDER — AZITHROMYCIN 250 MG PO TABS: ORAL_TABLET | ORAL | 0 refills | Status: DC

## 2023-07-24 NOTE — Assessment & Plan Note (Signed)
 Clinically stable.  No red flag signs or symptoms. No findings of pneumonia Viral infection with secondary bacterial infection Recommend daily azithromycin for 5 days Symptom management discussed

## 2023-07-24 NOTE — Progress Notes (Signed)
 Subjective:   Brenda [name] is a 73 y.o. who presents for a Medicare Wellness preventive visit.  Visit Complete: In person  Persons Participating in Visit: Patient.  AWV Questionnaire: No: Patient Medicare AWV questionnaire was not completed prior to this visit.  Cardiac Risk Factors include: advanced age (>20men, >90 women);dyslipidemia     Objective:    Today's Vitals   07/24/23 1021  BP: 118/62  Pulse: 64  SpO2: 97%  Weight: 165 lb 3.2 oz (74.9 kg)  Height: 5\' 5"  (1.651 m)   Body mass index is 27.49 kg/m.     07/24/2023   10:19 AM 03/05/2022    9:57 AM 01/12/2022   12:56 PM 08/19/2019    6:18 AM 08/13/2019    1:27 PM 12/21/2015    9:29 AM 08/27/2015    8:41 AM  Advanced Directives  Does Patient Have a Medical Advance Directive? Yes No No No No No No  Type of Estate agent of [place];Living will        Does patient want to make changes to medical advance directive?    No - Patient declined Yes (MAU/Ambulatory/Procedural Areas - Information given)    Copy of Healthcare Power of Attorney in Chart? No - copy requested        Would patient like information on creating a medical advance directive?  No - Patient declined No - Patient declined No - Patient declined       Current Medications (verified) Outpatient Encounter Medications as of 07/24/2023  Medication Sig   acetaminophen (TYLENOL) 500 MG tablet Take 500 mg by mouth every 6 (six) hours as needed (for pain.).   atorvastatin (LIPITOR) 10 MG tablet Take 1 tablet by mouth once daily   clobetasol cream (TEMOVATE) 0.05 %    DUPIXENT 300 MG/2ML SOPN    fluocinolone (SYNALAR) 0.01 % external solution Apply topically.   fluticasone (FLONASE) 50 MCG/ACT nasal spray Place 2 sprays into both nostrils daily.   gabapentin (NEURONTIN) 100 MG capsule Take 2 capsules (200 mg total) by mouth at bedtime.   hydrocortisone 2.5 % cream Apply topically.   hydrOXYzine (ATARAX) 25 MG tablet Take 1 tablet (25 mg  total) by mouth every 8 (eight) hours as needed (anxiety/itching.).   metroNIDAZOLE (METROCREAM) 0.75 % cream Apply 1 application  topically in the morning and at bedtime.   pantoprazole (PROTONIX) 40 MG tablet Take 1 tablet (40 mg total) by mouth daily.   triamcinolone lotion (KENALOG) 0.1 %    valACYclovir (VALTREX) 500 MG tablet TAKE 1 TABLET BY MOUTH TWICE DAILY FOR 3 DAYS FOR GENITAL LESIONS   Vitamin D, Ergocalciferol, (DRISDOL) 1.25 MG (50000 UNIT) CAPS capsule Take 1 capsule (50,000 Units total) by mouth every 7 (seven) days.   [DISCONTINUED] azithromycin (ZITHROMAX) 250 MG tablet Sig as indicated   [DISCONTINUED] benzonatate (TESSALON) 200 MG capsule Take 1 capsule (200 mg total) by mouth 2 (two) times daily as needed for cough.   [DISCONTINUED] cyclobenzaprine (FLEXERIL) 10 MG tablet Take 1 tablet (10 mg total) by mouth 2 (two) times daily as needed for muscle spasms.   [DISCONTINUED] HYDROcodone bit-homatropine (HYCODAN) 5-1.5 MG/5ML syrup Take 5 mLs by mouth at bedtime as needed for cough.   albuterol (VENTOLIN HFA) 108 (90 Base) MCG/ACT inhaler Inhale 2 puffs into the lungs 2 (two) times daily for 5 days.   famotidine (PEPCID) 40 MG tablet Take 1 tablet (40 mg total) by mouth daily for 7 days.   [DISCONTINUED] cetirizine (ZYRTEC)  10 MG tablet Take 1 tablet (10 mg total) by mouth daily for 10 days. (Patient not taking: Reported on 05/21/2023)   No facility-administered encounter medications on file as of 07/24/2023.    Allergies (verified) Hydrocodone   History: Past Medical History:  Diagnosis Date   ABDOMINAL/PELVIC SWELLING MASS/LUMP UNSPEC SITE 09/18/2007   ACUTE URIS OF UNSPECIFIED SITE 06/06/2007   Allergy    Anemia    Anxiety    no meds   Arthritis of hip    08/13/2019: per patient "both hips"   Arthritis of knee, left    Arthritis of right knee    ASTHMATIC BRONCHITIS, ACUTE 05/11/2008   DM 09/18/2007   borderline -diet controlled- no meds   GERD (gastroesophageal  reflux disease)    Glucose intolerance (impaired glucose tolerance)    HEARING LOSS 09/15/2009    mild -no hearing aids   HYPERLIPIDEMIA 04/25/2010   OSTEOPENIA 04/20/2007   Palpitations 03/13/2008   Pneumonia    THYROID NODULE, RIGHT 05/11/2008   Past Surgical History:  Procedure Laterality Date   BREAST BIOPSY Left 05/22/2019   BREAST BIOPSY Left 06/2020   DILATION AND CURETTAGE, DIAGNOSTIC / THERAPEUTIC     HAMMER TOE SURGERY     RIGHT   HERNIA REPAIR  1992   KNEE SURGERY  1991   Left   Left Knee cyst  1985   Lymph Node Removal     RADIOACTIVE SEED GUIDED EXCISIONAL BREAST BIOPSY Left 08/19/2019   Procedure: LEFT BREAST RADIOACTIVE SEED GUIDED EXCISIONAL BREAST BIOPSY;  Surgeon: Lockie Rima, MD;  Location: [facility] OR;  Service: General;  Laterality: Left;   Family History  Problem Relation Age of Onset   Cancer Mother        Breast Cancer   Breast cancer Mother    Cancer Father        "Stomach" Cancer   Stomach cancer Father    Cancer Sister        Colon Cancer   Breast cancer Sister    Kidney disease Brother        Kidney Transplant   Colon cancer Neg Hx    Esophageal cancer Neg Hx    Rectal cancer Neg Hx    Social History   Socioeconomic History   Marital status: Married    Spouse name: Cedric   Number of children: 2   Years of education: masters   Highest education level: Not on file  Occupational History   Occupation: Risk manager: [employer]  Tobacco Use   Smoking status: Never    Passive exposure: Never   Smokeless tobacco: Never  Vaping Use   Vaping status: Never Used  Substance and Sexual Activity   Alcohol use: Yes    Alcohol/week: 14.0 standard drinks of alcohol    Types: 14 Glasses of wine per week    Comment: occ   Drug use: No   Sexual activity: Yes    Birth control/protection: Post-menopausal  Other Topics Concern   Not on file  Social History Narrative   Married   Social Drivers of Health   Financial  Resource Strain: Low Risk  (07/24/2023)   Overall Financial Resource Strain (CARDIA)    Difficulty of Paying Living Expenses: Not hard at all  Food Insecurity: No Food Insecurity (07/24/2023)   Hunger Vital Sign    Worried About Running Out of Food in the Last Year: Never true    Ran Out of Food in  the Last Year: Never true  Transportation Needs: No Transportation Needs (07/24/2023)   PRAPARE - Administrator, Civil Service (Medical): No    Lack of Transportation (Non-Medical): No  Physical Activity: Sufficiently Active (07/24/2023)   Exercise Vital Sign    Days of Exercise per Week: 5 days    Minutes of Exercise per Session: 40 min  Stress: No Stress Concern Present (07/24/2023)   [employer] of Occupational Health - Occupational Stress Questionnaire    Feeling of Stress : Not at all  Social Connections: Socially Integrated (07/24/2023)   Social Connection and Isolation Panel [NHANES]    Frequency of Communication with Friends and Family: More than three times a week    Frequency of Social Gatherings with Friends and Family: More than three times a week    Attends Religious Services: More than 4 times per year    Active Member of [employer] or Organizations: Yes    Attends Engineer, structural: More than 4 times per year    Marital Status: Married    Tobacco Counseling Counseling given: No    Clinical Intake:  Pre-visit preparation completed: Yes  Pain : No/denies pain     BMI - recorded: 27.49 Nutritional Status: BMI 25 -29 Overweight Nutritional Risks: None Diabetes: No  Lab Results  Component Value Date   HGBA1C 5.7 02/22/2021   HGBA1C 6.0 (H) 08/13/2019   HGBA1C 6.3 01/21/2019     How often do you need to have someone help you when you read instructions, pamphlets, or other written materials from your doctor or pharmacy?: 1 - Never  Interpreter Needed?: No  Information entered by :: Kandy Orris, CMA   Activities of Daily Living      07/24/2023   10:25 AM  In your present state of health, do you have any difficulty performing the following activities:  Hearing? 0  Vision? 0  Difficulty concentrating or making decisions? 0  Walking or climbing stairs? 0  Dressing or bathing? 0  Doing errands, shopping? 0  Preparing Food and eating ? N  Using the Toilet? N  In the past six months, have you accidently leaked urine? N  Do you have problems with loss of bowel control? N  Managing your Medications? N  Managing your Finances? N  Housekeeping or managing your Housekeeping? N    Patient Care Team: Elvira Hammersmith, MD as PCP - General (Internal Medicine) Pietro Bridegroom, MD (Inactive) (Gastroenterology) Woodrow Hazy, MD as Attending Physician (Obstetrics and Gynecology) Janita Mellow, MD as Attending Physician (Otolaryngology)  Indicate any recent Medical Services you may have received from other than [facility] providers in the past year (date may be approximate).     Assessment:   This is a routine wellness examination for Brenda [name].  Hearing/Vision screen Hearing Screening - Comments:: Denies hearing difficulties   Vision Screening - Comments:: Wears rx glasses - up to date with routine eye exams with the [facility] in [place], [place]   Goals Addressed               This Visit's Progress     Patient Stated (pt-stated)        Patient stated she plans to continue exercising.       Depression Screen     07/24/2023   10:27 AM 01/05/2023   11:27 AM 04/12/2022   10:14 AM 01/12/2022   12:48 PM 11/17/2021   11:25 AM 09/14/2021   10:58 AM 09/14/2021   10:55  AM  PHQ 2/9 Scores  PHQ - 2 Score 0 0 0 0 0 0 0  PHQ- 9 Score 0          Fall Risk     07/24/2023   10:25 AM 01/05/2023   11:27 AM 04/12/2022   10:14 AM 01/12/2022   12:56 PM 11/17/2021   11:25 AM  Fall Risk   Falls in the past year? 0 0 0 1 0  Number falls in past yr: 0 0 0 0 0  Injury with Fall? 0 0 0 0 0  Risk for fall due to : No Fall Risks No Fall  Risks No Fall Risks Impaired vision;History of fall(s);Impaired balance/gait;Impaired mobility No Fall Risks  Follow up Falls prevention discussed;Falls evaluation completed Falls evaluation completed Falls evaluation completed Falls evaluation completed Falls evaluation completed    MEDICARE RISK AT HOME:  Medicare Risk at Home Any stairs in or around the home?: Yes If so, are there any without handrails?: No Home free of loose throw rugs in walkways, pet beds, electrical cords, etc?: Yes Adequate lighting in your home to reduce risk of falls?: Yes Life alert?: No Use of a cane, walker or w/c?: No Grab bars in the bathroom?: No Shower chair or bench in shower?: Yes Elevated toilet seat or a handicapped toilet?: Yes  TIMED UP AND GO:  Was the test performed?  No  Cognitive Function: 6CIT completed        07/24/2023   10:29 AM 01/12/2022   12:58 PM  6CIT Screen  What Year? 0 points 0 points  What month? 0 points 0 points  What time? 0 points 0 points  Count back from 20 0 points 0 points  Months in reverse 0 points 0 points  Repeat phrase 0 points 0 points  Total Score 0 points 0 points    Immunizations Immunization History  Administered Date(s) Administered   Fluad Quad(high Dose 65+) 01/21/2019, 02/09/2020, 02/22/2021   Fluad Trivalent(High Dose 65+) 01/24/2023   Influenza,inj,Quad PF,6+ Mos 12/21/2015, 01/22/2017   Influenza-Unspecified 01/09/2019, 02/09/2020, 01/08/2021   PFIZER(Purple Top)SARS-COV-2 Vaccination 04/29/2019, 05/20/2019, 01/16/2020, 01/16/2020   Pneumococcal Conjugate-13 01/17/2018   Pneumococcal Polysaccharide-23 09/19/2013, 01/17/2018   Td 05/11/1998, 04/10/2000   Tdap 07/21/2014, 11/20/2014   Zoster Recombinant(Shingrix) 01/21/2019, 04/09/2019   Zoster, Live 09/19/2013    Screening Tests Health Maintenance  Topic Date Due   MAMMOGRAM  06/15/2022   COVID-19 Vaccine (5 - 2024-25 season) 12/10/2022   INFLUENZA VACCINE  11/09/2023   Medicare  Annual Wellness (AWV)  07/23/2024   DTaP/Tdap/Td (5 - Td or Tdap) 11/19/2024   Colonoscopy  09/16/2025   Pneumonia Vaccine 64+ Years old  Completed   DEXA SCAN  Completed   Hepatitis C Screening  Completed   Zoster Vaccines- Shingrix  Completed   HPV VACCINES  Aged Out   Meningococcal B Vaccine  Aged Out    Health Maintenance  Health Maintenance Due  Topic Date Due   MAMMOGRAM  06/15/2022   COVID-19 Vaccine (5 - 2024-25 season) 12/10/2022   Health Maintenance Items Addressed: 07/24/2023  DEXA Scan & Mammogram completed - ordered by [facility] [place], Dr Woodrow Hazy.  Will send request for reports to update records.  Additional Screening:  Vision Screening: Recommended annual ophthalmology exams for early detection of glaucoma and other disorders of the eye.  Pt stated she's seen at the [facility] in [place], [place] for routine yearly eye exams.  Dental Screening: Recommended annual dental exams for proper oral hygiene  Community Resource Referral / Chronic Care Management: CRR required this visit?  No   CCM required this visit?  No     Plan:     I have personally reviewed and noted the following in the patient's chart:   Medical and social history Use of alcohol, tobacco or illicit drugs  Current medications and supplements including opioid prescriptions. Patient is not currently taking opioid prescriptions. Functional ability and status Nutritional status Physical activity Advanced directives List of other physicians Hospitalizations, surgeries, and ER visits in previous 12 months Vitals Screenings to include cognitive, depression, and falls Referrals and appointments  In addition, I have reviewed and discussed with patient certain preventive protocols, quality metrics, and best practice recommendations. A written personalized care plan for preventive services as well as general preventive health recommendations were provided to patient.     Patria Bookbinder,  CMA   07/24/2023   After Visit Summary: (In Person-Declined) Patient declined AVS at this time.  Notes: Nothing significant to report at this time.

## 2023-07-24 NOTE — Patient Instructions (Signed)

## 2023-07-24 NOTE — Patient Instructions (Addendum)
 Brenda [name] , Thank you for taking time to come for your Medicare Wellness Visit. I appreciate your ongoing commitment to your health goals. Please review the following plan we discussed and let me know if I can assist you in the future.   Referrals/Orders/Follow-Ups/Clinician Recommendations: Aim for 30 minutes of exercise or brisk walking, 6-8 glasses of water, and 5 servings of fruits and vegetables each day. Will obtain DEXA scan and Mammogram reports from Dr [facility]).  This is a list of the screening recommended for you and due dates:  Health Maintenance  Topic Date Due   Mammogram  06/15/2022   COVID-19 Vaccine (5 - 2024-25 season) 12/10/2022   Flu Shot  11/09/2023   Medicare Annual Wellness Visit  07/23/2024   DTaP/Tdap/Td vaccine (5 - Td or Tdap) 11/19/2024   Colon Cancer Screening  09/16/2025   Pneumonia Vaccine  Completed   DEXA scan (bone density measurement)  Completed   Hepatitis C Screening  Completed   Zoster (Shingles) Vaccine  Completed   HPV Vaccine  Aged Out   Meningitis B Vaccine  Aged Out    Advanced directives: (Copy Requested) Please bring a copy of your health care power of attorney and living will to the office to be added to your chart at your convenience. You can mail to [facility] [address] [place], [place] [address] or email to [email or web address]  Next Medicare Annual Wellness Visit scheduled for next year: Yes

## 2023-07-24 NOTE — Assessment & Plan Note (Signed)
 Cardiovascular risks associated with dyslipidemia discussed. Continue atorvastatin 10 mg daily.

## 2023-07-24 NOTE — Assessment & Plan Note (Signed)
Stable.  States she gets physical therapy and care at the [place] [facility].

## 2023-07-24 NOTE — Assessment & Plan Note (Signed)
Hemoglobin A1c done today Diet and nutrition discussed. Advised to decrease amount of daily carbohydrate intake and daily calories and increase amount of plant-based protein in her diet

## 2023-07-24 NOTE — Progress Notes (Signed)
 Brenda [name] 73 y.o.   Chief Complaint  Patient presents with   Annual Exam    Onset 07/12/2022  Coughing ans sneezing, chest congestion  Pt took a trip to [place]     HISTORY OF PRESENT ILLNESS: This is a 73 y.o. female here for annual exam. Recently returned from [place]. Developed flulike symptoms about 2 weeks ago progressively getting worse.  Tested negative for COVID and flu. No other complaints or medical concerns today.  HPI   Prior to Admission medications   Medication Sig Start Date End Date Taking? Authorizing Provider  acetaminophen (TYLENOL) 500 MG tablet Take 500 mg by mouth every 6 (six) hours as needed (for pain.).   Yes [provider]  atorvastatin (LIPITOR) 10 MG tablet Take 1 tablet by mouth once daily 07/08/23  Yes Elior Robinette, Eilleen Kempf, MD  azithromycin [facility]) 250 MG tablet Sig as indicated 07/24/23  Yes Shianna Bally, Eilleen Kempf, MD  clobetasol cream (TEMOVATE) 0.05 %  10/14/15  Yes [provider]  DUPIXENT 300 MG/2ML SOPN    Yes [provider]  fluocinolone (SYNALAR) 0.01 % external solution Apply topically. 04/23/23  Yes [provider]  fluticasone (FLONASE) 50 MCG/ACT nasal spray Place 2 sprays into both nostrils daily. 07/12/21  Yes Myrlene Broker, MD  gabapentin (NEURONTIN) 100 MG capsule Take 2 capsules (200 mg total) by mouth at bedtime. 03/20/18  Yes Antoine Primas M, DO  hydrocortisone 2.5 % cream Apply topically. 08/19/21  Yes [provider]  hydrOXYzine (ATARAX) 25 MG tablet Take 1 tablet (25 mg total) by mouth every 8 (eight) hours as needed (anxiety/itching.). 09/28/22  Yes Darriel Sinquefield, Eilleen Kempf, MD  metroNIDAZOLE (METROCREAM) 0.75 % cream Apply 1 application  topically in the morning and at bedtime. 01/03/18  Yes [provider]  pantoprazole (PROTONIX) 40 MG tablet Take 1 tablet (40 mg total) by mouth daily. 05/21/23  Yes Napoleon Form, MD  triamcinolone lotion (KENALOG) 0.1  %  01/04/22  Yes [provider]  valACYclovir (VALTREX) 500 MG tablet TAKE 1 TABLET BY MOUTH TWICE DAILY FOR 3 DAYS FOR GENITAL LESIONS   Yes [provider]  albuterol (VENTOLIN HFA) 108 (90 Base) MCG/ACT inhaler Inhale 2 puffs into the lungs 2 (two) times daily for 5 days. 04/08/22 04/13/22  Claiborne Rigg, NP  famotidine (PEPCID) 40 MG tablet Take 1 tablet (40 mg total) by mouth daily for 7 days. 11/17/21 11/24/21  Georgina Quint, MD  Vitamin D, Ergocalciferol, (DRISDOL) 1.25 MG (50000 UNIT) CAPS capsule Take 1 capsule (50,000 Units total) by mouth every 7 (seven) days. Patient not taking: Reported on 07/24/2023 05/13/19   Judi Saa, DO    Allergies  Allergen Reactions   Hydrocodone Other (See Comments)    drowsiness    Patient Active Problem List   Diagnosis Date Noted   Primary osteoarthritis of left shoulder 04/27/2021   Spinal stenosis 02/22/2021   Alcohol abuse 11/30/2020   Bilateral hearing loss 11/30/2020   Insomnia 11/30/2020   Mucous polyp of cervix 11/30/2020   Degenerative arthritis of knee, bilateral 03/20/2018   Prediabetes 01/17/2018   Arthralgia 11/17/2015   Left lumbar radiculopathy 10/25/2015   Menopausal state 08/22/2011   Hyperlipidemia 04/25/2010   HEARING LOSS 09/15/2009    Past Medical History:  Diagnosis Date   ABDOMINAL/PELVIC SWELLING MASS/LUMP UNSPEC SITE 09/18/2007   ACUTE URIS OF UNSPECIFIED SITE 06/06/2007   Allergy    Anemia    Anxiety  no meds   Arthritis of hip    08/13/2019: per patient "both hips"   Arthritis of knee, left    Arthritis of right knee    ASTHMATIC BRONCHITIS, ACUTE 05/11/2008   DM 09/18/2007   borderline -diet controlled- no meds   GERD (gastroesophageal reflux disease)    Glucose intolerance (impaired glucose tolerance)    HEARING LOSS 09/15/2009    mild -no hearing aids   HYPERLIPIDEMIA 04/25/2010   OSTEOPENIA 04/20/2007   Palpitations 03/13/2008   Pneumonia    THYROID NODULE, RIGHT 05/11/2008     Past Surgical History:  Procedure Laterality Date   BREAST BIOPSY Left 05/22/2019   BREAST BIOPSY Left 06/2020   DILATION AND CURETTAGE, DIAGNOSTIC / THERAPEUTIC     HAMMER TOE SURGERY     RIGHT   HERNIA REPAIR  1992   KNEE SURGERY  1991   Left   Left Knee cyst  1985   Lymph Node Removal     RADIOACTIVE SEED GUIDED EXCISIONAL BREAST BIOPSY Left 08/19/2019   Procedure: LEFT BREAST RADIOACTIVE SEED GUIDED EXCISIONAL BREAST BIOPSY;  Surgeon: Almond Lint, MD;  Location: [facility] OR;  Service: General;  Laterality: Left;    Social History   Socioeconomic History   Marital status: Married    Spouse name: Cedric   Number of children: 2   Years of education: masters   Highest education level: Not on file  Occupational History   Occupation: Risk manager: [employer]  Tobacco Use   Smoking status: Never    Passive exposure: Never   Smokeless tobacco: Never  Vaping Use   Vaping status: Never Used  Substance and Sexual Activity   Alcohol use: Yes    Alcohol/week: 14.0 standard drinks of alcohol    Types: 14 Glasses of wine per week    Comment: occ   Drug use: No   Sexual activity: Yes    Birth control/protection: Post-menopausal  Other Topics Concern   Not on file  Social History Narrative   Married   Social Drivers of Health   Financial Resource Strain: Low Risk  (07/24/2023)   Overall Financial Resource Strain (CARDIA)    Difficulty of Paying Living Expenses: Not hard at all  Food Insecurity: No Food Insecurity (07/24/2023)   Hunger Vital Sign    Worried About Running Out of Food in the Last Year: Never true    Ran Out of Food in the Last Year: Never true  Transportation Needs: No Transportation Needs (07/24/2023)   PRAPARE - Administrator, Civil Service (Medical): No    Lack of Transportation (Non-Medical): No  Physical Activity: Sufficiently Active (07/24/2023)   Exercise Vital Sign    Days of Exercise per Week: 5 days     Minutes of Exercise per Session: 40 min  Stress: No Stress Concern Present (07/24/2023)   [employer] of Occupational Health - Occupational Stress Questionnaire    Feeling of Stress : Not at all  Social Connections: Socially Integrated (07/24/2023)   Social Connection and Isolation Panel [NHANES]    Frequency of Communication with Friends and Family: More than three times a week    Frequency of Social Gatherings with Friends and Family: More than three times a week    Attends Religious Services: More than 4 times per year    Active Member of [employer] or Organizations: Yes    Attends Banker Meetings: More than 4 times per year  Marital Status: Married  Catering manager Violence: Not At Risk (07/24/2023)   Humiliation, Afraid, Rape, and Kick questionnaire    Fear of Current or Ex-Partner: No    Emotionally Abused: No    Physically Abused: No    Sexually Abused: No    Family History  Problem Relation Age of Onset   Cancer Mother        Breast Cancer   Breast cancer Mother    Cancer Father        "Stomach" Cancer   Stomach cancer Father    Cancer Sister        Colon Cancer   Breast cancer Sister    Kidney disease Brother        Kidney Transplant   Colon cancer Neg Hx    Esophageal cancer Neg Hx    Rectal cancer Neg Hx      Review of Systems  Constitutional: Negative.  Negative for chills and fever.  HENT:  Positive for congestion. Negative for sore throat.   Respiratory:  Positive for cough. Negative for shortness of breath.   Cardiovascular: Negative.  Negative for chest pain and palpitations.  Gastrointestinal:  Negative for abdominal pain, diarrhea, nausea and vomiting.  Genitourinary: Negative.  Negative for dysuria and hematuria.  Skin: Negative.  Negative for rash.  Neurological: Negative.  Negative for dizziness and headaches.  All other systems reviewed and are negative.   Vitals:   07/24/23 1128  BP: 128/74  Pulse: 77  Temp: 98.4 F (36.9  C)  SpO2: 97%    Physical Exam Vitals reviewed.  Constitutional:      Appearance: Normal appearance.  HENT:     Head: Normocephalic.     Right Ear: Tympanic membrane, ear canal and external ear normal.     Left Ear: Tympanic membrane, ear canal and external ear normal.     Mouth/Throat:     Mouth: Mucous membranes are moist.     Pharynx: Oropharynx is clear.  Eyes:     Extraocular Movements: Extraocular movements intact.     Conjunctiva/sclera: Conjunctivae normal.     Pupils: Pupils are equal, round, and reactive to light.  Cardiovascular:     Rate and Rhythm: Normal rate and regular rhythm.     Pulses: Normal pulses.     Heart sounds: Normal heart sounds.  Pulmonary:     Effort: Pulmonary effort is normal.     Breath sounds: Normal breath sounds.  Abdominal:     Palpations: Abdomen is soft.     Tenderness: There is no abdominal tenderness.  Musculoskeletal:     Cervical back: No tenderness.  Lymphadenopathy:     Cervical: No cervical adenopathy.  Skin:    General: Skin is warm and dry.     Capillary Refill: Capillary refill takes less than 2 seconds.  Neurological:     General: No focal deficit present.     Mental Status: She is alert and oriented to person, place, and time.  Psychiatric:        Mood and Affect: Mood normal.        Behavior: Behavior normal.      ASSESSMENT & PLAN: Problem List Items Addressed This Visit       Respiratory   Lower respiratory infection   Clinically stable.  No red flag signs or symptoms. No findings of pneumonia Viral infection with secondary bacterial infection Recommend daily azithromycin for 5 days Symptom management discussed      Relevant Medications  azithromycin (ZITHROMAX) 250 MG tablet     Digestive   Gastroesophageal reflux disease with esophagitis without hemorrhage   Recent upper endoscopy report reviewed with patient February 2025 Found to have GERD with esophagitis, gastritis and duodenitis Started  on Protonix 40 mg daily Symptoms much improved Diet and nutrition discussed        Other   Hyperlipidemia   Cardiovascular risks associated with dyslipidemia discussed. Continue atorvastatin 10 mg daily.      Prediabetes   Hemoglobin A1c done today Diet and nutrition discussed Advised to decrease amount of daily carbohydrate intake and daily calories and increase amount of plant-based protein in her diet      Spinal stenosis   Stable. States she gets physical therapy and care at the [place] [facility].       Other Visit Diagnoses       Encounter for general adult medical examination with abnormal findings    -  Primary   Relevant Orders   CBC with Differential/Platelet   Comprehensive metabolic panel with GFR   Hemoglobin A1c   Lipid panel     Screening for deficiency anemia       Relevant Orders   CBC with Differential/Platelet     Screening for lipoid disorders       Relevant Orders   Lipid panel     Screening for endocrine, metabolic and immunity disorder       Relevant Orders   Comprehensive metabolic panel with GFR   Hemoglobin A1c        Modifiable risk factors discussed with patient. Anticipatory guidance according to age provided. The following topics were also discussed: Social Determinants of Health Smoking.  Non-smoker Diet and nutrition Benefits of exercise Cancer screening and review of most recent colonoscopy and mammogram reports Review of recent upper endoscopy report Vaccinations review and recommendations Cardiovascular risk assessment Mental health including depression and anxiety Fall and accident prevention  Patient Instructions  Health Maintenance, Female Adopting a healthy lifestyle and getting preventive care are important in promoting health and wellness. Ask your health care provider about: The right schedule for you to have regular tests and exams. Things you can do on your own to prevent diseases and keep yourself healthy. What  should I know about diet, weight, and exercise? Eat a healthy diet  Eat a diet that includes plenty of vegetables, fruits, low-fat dairy products, and lean protein. Do not eat a lot of foods that are high in solid fats, added sugars, or sodium. Maintain a healthy weight Body mass index (BMI) is used to identify weight problems. It estimates body fat based on height and weight. Your health care provider can help determine your BMI and help you achieve or maintain a healthy weight. Get regular exercise Get regular exercise. This is one of the most important things you can do for your health. Most adults should: Exercise for at least 150 minutes each week. The exercise should increase your heart rate and make you sweat (moderate-intensity exercise). Do strengthening exercises at least twice a week. This is in addition to the moderate-intensity exercise. Spend less time sitting. Even light physical activity can be beneficial. Watch cholesterol and blood lipids Have your blood tested for lipids and cholesterol at 71 years of age, then have this test every 5 years. Have your cholesterol levels checked more often if: Your lipid or cholesterol levels are high. You are older than 71 years of age. You are at high risk for heart disease. What  should I know about cancer screening? Depending on your health history and family history, you may need to have cancer screening at various ages. This may include screening for: Breast cancer. Cervical cancer. Colorectal cancer. Skin cancer. Lung cancer. What should I know about heart disease, diabetes, and high blood pressure? Blood pressure and heart disease High blood pressure causes heart disease and increases the risk of stroke. This is more likely to develop in people who have high blood pressure readings or are overweight. Have your blood pressure checked: Every 3-5 years if you are 26-44 years of age. Every year if you are 50 years old or  older. Diabetes Have regular diabetes screenings. This checks your fasting blood sugar level. Have the screening done: Once every three years after age 32 if you are at a normal weight and have a low risk for diabetes. More often and at a younger age if you are overweight or have a high risk for diabetes. What should I know about preventing infection? Hepatitis B If you have a higher risk for hepatitis B, you should be screened for this virus. Talk with your health care provider to find out if you are at risk for hepatitis B infection. Hepatitis C Testing is recommended for: Everyone born from [birth year] through 1963. Anyone with known risk factors for hepatitis C. Sexually transmitted infections (STIs) Get screened for STIs, including gonorrhea and chlamydia, if: You are sexually active and are younger than 72 years of age. You are older than 71 years of age and your health care provider tells you that you are at risk for this type of infection. Your sexual activity has changed since you were last screened, and you are at increased risk for chlamydia or gonorrhea. Ask your health care provider if you are at risk. Ask your health care provider about whether you are at high risk for HIV. Your health care provider may recommend a prescription medicine to help prevent HIV infection. If you choose to take medicine to prevent HIV, you should first get tested for HIV. You should then be tested every 3 months for as long as you are taking the medicine. Pregnancy If you are about to stop having your period (premenopausal) and you may become pregnant, seek counseling before you get pregnant. Take 400 to 800 micrograms (mcg) of folic acid every day if you become pregnant. Ask for birth control (contraception) if you want to prevent pregnancy. Osteoporosis and menopause Osteoporosis is a disease in which the bones lose minerals and strength with aging. This can result in bone fractures. If you are 63 years old  or older, or if you are at risk for osteoporosis and fractures, ask your health care provider if you should: Be screened for bone loss. Take a calcium or vitamin D supplement to lower your risk of fractures. Be given hormone replacement therapy (HRT) to treat symptoms of menopause. Follow these instructions at home: Alcohol use Do not drink alcohol if: Your health care provider tells you not to drink. You are pregnant, may be pregnant, or are planning to become pregnant. If you drink alcohol: Limit how much you have to: 0-1 drink a day. Know how much alcohol is in your drink. In the [place] one drink equals one 12 oz bottle of beer (355 mL), one 5 oz glass of wine (148 mL), or one 1 oz glass of hard liquor (44 mL). Lifestyle Do not use any products that contain nicotine or tobacco. These products include cigarettes, chewing  tobacco, and vaping devices, such as e-cigarettes. If you need help quitting, ask your health care provider. Do not use street drugs. Do not share needles. Ask your health care provider for help if you need support or information about quitting drugs. General instructions Schedule regular health, dental, and eye exams. Stay current with your vaccines. Tell your health care provider if: You often feel depressed. You have ever been abused or do not feel safe at home. Summary Adopting a healthy lifestyle and getting preventive care are important in promoting health and wellness. Follow your health care provider's instructions about healthy diet, exercising, and getting tested or screened for diseases. Follow your health care provider's instructions on monitoring your cholesterol and blood pressure. This information is not intended to replace advice given to you by your health care provider. Make sure you discuss any questions you have with your health care provider. Document Revised: 08/16/2020 Document Reviewed: 08/16/2020 Elsevier Patient Education  2024 Elsevier Inc.       Maryagnes Small, MD [facility]

## 2023-07-24 NOTE — Assessment & Plan Note (Signed)
 Recent upper endoscopy report reviewed with patient February 2025 Found to have GERD with esophagitis, gastritis and duodenitis Started on Protonix 40 mg daily Symptoms much improved Diet and nutrition discussed

## 2023-08-31 ENCOUNTER — Ambulatory Visit (INDEPENDENT_AMBULATORY_CARE_PROVIDER_SITE_OTHER): Payer: Medicare Other | Admitting: Gastroenterology

## 2023-08-31 ENCOUNTER — Encounter: Payer: Self-pay | Admitting: Gastroenterology

## 2023-08-31 VITALS — BP 118/62 | HR 94 | Ht 66.0 in | Wt 164.0 lb

## 2023-08-31 DIAGNOSIS — R140 Abdominal distension (gaseous): Secondary | ICD-10-CM | POA: Diagnosis not present

## 2023-08-31 DIAGNOSIS — L309 Dermatitis, unspecified: Secondary | ICD-10-CM | POA: Diagnosis not present

## 2023-08-31 DIAGNOSIS — R1013 Epigastric pain: Secondary | ICD-10-CM

## 2023-08-31 DIAGNOSIS — K588 Other irritable bowel syndrome: Secondary | ICD-10-CM

## 2023-08-31 DIAGNOSIS — E7800 Pure hypercholesterolemia, unspecified: Secondary | ICD-10-CM

## 2023-08-31 NOTE — Patient Instructions (Addendum)
 VISIT SUMMARY:  During your visit, we discussed your generalized joint pain, bloating, and eczema. We also reviewed your cholesterol levels and general health maintenance.  YOUR PLAN:  -BLOATING: You have chronic bloating without pain, and your bowel movements are regular. Your liver and kidney functions are normal. For symptom management, you can use over-the-counter options like FDGard or Ibgard as needed.  -ECZEMA: Your chronic eczema has resulted in dark spots with red circles and itchiness. You are currently managing it with Dupixent. Please report any new lesions, especially those with red rings, to your primary care physician.  -HYPERCHOLESTEROLEMIA: Your cholesterol levels are slightly elevated but are not causing any major concerns with your liver or kidney function. Your condition is well-managed.  -GENERAL HEALTH MAINTENANCE: Your colonoscopy recall for 2027, and your previous colonoscopy in 2017 was normal.  INSTRUCTIONS:  Please follow up with your primary care physician regarding your joint pain, headache, and any new skin lesions.  I appreciate the  opportunity to care for you  Thank You   Kavitha Nandigam , MD

## 2023-08-31 NOTE — Progress Notes (Unsigned)
 Brenda [name]    [id number]    1951/03/14  Primary Care Physician:Sagardia, Isidro Margo, MD  Referring Physician: Elvira Hammersmith, MD [address] [place],  [place] [address]   Chief complaint:  Abdominal bloating   History of Present Illness Brenda [name] is a 73 year old female who presents with generalized joint pain and bloating.  She has been experiencing generalized joint and muscle pain since Wednesday night, severe enough to disrupt her sleep. Alongside the pain, she has had a persistent headache. She took 500 mg of Tylenol  three times on Thursday, which provided some relief, but the headache persisted. She is concerned about taking Tylenol  due to a previous experience with left hip pain last month, for which she also used Tylenol .  She reports ongoing bloating without associated pain. She has regular bowel movements twice in the morning and denies any blood in her stool or black stool. She does not drink coffee and has no trouble swallowing.  She has a history of eczema, managed with Dupixent. She describes dark spots with red circles and itchiness, noting one on her back. She was tested for tick bites two summers ago. EGD 05/21/23 - LA Grade B reflux esophagitis with no bleeding.                           - Gastroesophageal flap valve classified as Hill                            Grade II (fold present, opens with respiration).                           - Gastritis. Biopsied.                           - Erythematous duodenopathy. Biopsied.  1. Surgical [P], duodenal biopsies :       -  BENIGN DUODENAL MUCOSA       -  NO ACUTE INFLAMMATION, VILLOUS BLUNTING OR INCREASED INTRAEPITHELIAL       LYMPHOCYTES IDENTIFIED        2. Surgical [P], gastric biopsies :       -  MILD CHRONIC GASTRITIS WITHOUT ACTIVITY       -  NO H. PYLORI OR INTESTINAL METAPLASIA IDENTIFIED    Abdominal ultrasound January 12, 2023 1. Heterogeneous appearance of the liver.  Clinical correlation recommended for possible underlying cirrhosis. 2. CBD dilatation. MRCP could be done to evaluate for potential etiologies.  Colonoscopy 09/17/2015 - Diverticulosis in the sigmoid colon, in the descending colon, in the transverse colon and in the ascending colon. - Non- bleeding internal hemorrhoids. - The examination was otherwise normal.  Outpatient Encounter Medications as of 08/31/2023  Medication Sig   acetaminophen  (TYLENOL ) 500 MG tablet Take 500 mg by mouth every 6 (six) hours as needed (for pain.).   atorvastatin  (LIPITOR) 10 MG tablet Take 1 tablet by mouth once daily   azithromycin  (ZITHROMAX ) 250 MG tablet Sig as indicated   clobetasol cream (TEMOVATE) 0.05 %    DUPIXENT 300 MG/2ML SOPN    fluocinolone (SYNALAR) 0.01 % external solution Apply topically.   fluticasone  (FLONASE ) 50 MCG/ACT nasal spray Place 2 sprays into both nostrils daily.   gabapentin  (NEURONTIN ) 100 MG capsule Take  2 capsules (200 mg total) by mouth at bedtime.   hydrocortisone 2.5 % cream Apply topically.   hydrOXYzine  (ATARAX ) 25 MG tablet Take 1 tablet (25 mg total) by mouth every 8 (eight) hours as needed (anxiety/itching.).   metroNIDAZOLE (METROCREAM) 0.75 % cream Apply 1 application  topically in the morning and at bedtime.   pantoprazole  (PROTONIX ) 40 MG tablet Take 1 tablet (40 mg total) by mouth daily.   triamcinolone  lotion (KENALOG ) 0.1 %    valACYclovir (VALTREX) 500 MG tablet TAKE 1 TABLET BY MOUTH TWICE DAILY FOR 3 DAYS FOR GENITAL LESIONS   Vitamin D , Ergocalciferol , (DRISDOL ) 1.25 MG (50000 UNIT) CAPS capsule Take 1 capsule (50,000 Units total) by mouth every 7 (seven) days.   albuterol  (VENTOLIN  HFA) 108 (90 Base) MCG/ACT inhaler Inhale 2 puffs into the lungs 2 (two) times daily for 5 days.   famotidine  (PEPCID ) 40 MG tablet Take 1 tablet (40 mg total) by mouth daily for 7 days.   No facility-administered encounter medications on file as of 08/31/2023.    Allergies as of  08/31/2023 - Review Complete 08/31/2023  Allergen Reaction Noted   Hydrocodone  Other (See Comments)     Past Medical History:  Diagnosis Date   ABDOMINAL/PELVIC SWELLING MASS/LUMP UNSPEC SITE 09/18/2007   ACUTE URIS OF UNSPECIFIED SITE 06/06/2007   Allergy    Anemia    Anxiety    no meds   Arthritis of hip    08/13/2019: per patient "both hips"   Arthritis of knee, left    Arthritis of right knee    ASTHMATIC BRONCHITIS, ACUTE 05/11/2008   DM 09/18/2007   borderline -diet controlled- no meds   GERD (gastroesophageal reflux disease)    Glucose intolerance (impaired glucose tolerance)    HEARING LOSS 09/15/2009    mild -no hearing aids   HYPERLIPIDEMIA 04/25/2010   OSTEOPENIA 04/20/2007   Palpitations 03/13/2008   Pneumonia    THYROID  NODULE, RIGHT 05/11/2008    Past Surgical History:  Procedure Laterality Date   BREAST BIOPSY Left 05/22/2019   BREAST BIOPSY Left 06/2020   DILATION AND CURETTAGE, DIAGNOSTIC / THERAPEUTIC     HAMMER TOE SURGERY     RIGHT   HERNIA REPAIR  1992   KNEE SURGERY  1991   Left   Left Knee cyst  1985   Lymph Node Removal     RADIOACTIVE SEED GUIDED EXCISIONAL BREAST BIOPSY Left 08/19/2019   Procedure: LEFT BREAST RADIOACTIVE SEED GUIDED EXCISIONAL BREAST BIOPSY;  Surgeon: Lockie Rima, MD;  Location: [facility] OR;  Service: General;  Laterality: Left;    Family History  Problem Relation Age of Onset   Cancer Mother        Breast Cancer   Breast cancer Mother    Cancer Father        "Stomach" Cancer   Stomach cancer Father    Cancer Sister        Colon Cancer   Breast cancer Sister    Kidney disease Brother        Kidney Transplant   Colon cancer Neg Hx    Esophageal cancer Neg Hx    Rectal cancer Neg Hx     Social History   Socioeconomic History   Marital status: Married    Spouse name: Cedric   Number of children: 2   Years of education: masters   Highest education level: Not on file  Occupational History   Occupation: Musician: [employer]  Tobacco Use   Smoking status: Never    Passive exposure: Never   Smokeless tobacco: Never  Vaping Use   Vaping status: Never Used  Substance and Sexual Activity   Alcohol use: Yes    Alcohol/week: 14.0 standard drinks of alcohol    Types: 14 Glasses of wine per week    Comment: occ   Drug use: No   Sexual activity: Yes    Birth control/protection: Post-menopausal  Other Topics Concern   Not on file  Social History Narrative   Married   Social Drivers of Health   Financial Resource Strain: Low Risk  (07/24/2023)   Overall Financial Resource Strain (CARDIA)    Difficulty of Paying Living Expenses: Not hard at all  Food Insecurity: No Food Insecurity (07/24/2023)   Hunger Vital Sign    Worried About Running Out of Food in the Last Year: Never true    Ran Out of Food in the Last Year: Never true  Transportation Needs: No Transportation Needs (07/24/2023)   PRAPARE - Administrator, Civil Service (Medical): No    Lack of Transportation (Non-Medical): No  Physical Activity: Sufficiently Active (07/24/2023)   Exercise Vital Sign    Days of Exercise per Week: 5 days    Minutes of Exercise per Session: 40 min  Stress: No Stress Concern Present (07/24/2023)   [employer] of Occupational Health - Occupational Stress Questionnaire    Feeling of Stress : Not at all  Social Connections: Socially Integrated (07/24/2023)   Social Connection and Isolation Panel [NHANES]    Frequency of Communication with Friends and Family: More than three times a week    Frequency of Social Gatherings with Friends and Family: More than three times a week    Attends Religious Services: More than 4 times per year    Active Member of [employer] or Organizations: Yes    Attends Engineer, structural: More than 4 times per year    Marital Status: Married  Catering manager Violence: Not At Risk (07/24/2023)   Humiliation, Afraid, Rape, and Kick questionnaire     Fear of Current or Ex-Partner: No    Emotionally Abused: No    Physically Abused: No    Sexually Abused: No      Review of systems: All other review of systems negative except as mentioned in the HPI.   Physical Exam: Vitals:   08/31/23 1046  BP: 118/62  Pulse: 94   Body mass index is 26.47 kg/m. Gen:      No acute distress HEENT:  sclera anicteric CV: s1s2 rrr, no murmur Lungs: B/l clear. Abd:      soft, non-tender; no palpable masses, no distension Ext:    No edema Neuro: alert and oriented x 3 Psych: normal mood and affect  Data Reviewed:  Reviewed labs, radiology imaging, old records and pertinent past GI work up     Assessment and Plan Assessment & Plan Bloating Chronic bloating without associated pain. Regular bowel movements with no hematochezia or melena. No dysphagia, gas, fecal impaction, or fluid retention. GI evaluation indicates well-managed condition. Normal liver and kidney functions. Recommended over-the-counter options for symptom management. - Recommend over-the-counter IBGard or FDgard as needed for bloating and dyspepsia.  Eczema Chronic eczema with recent development of hyperpigmented macules with erythematous rings and pruritus. No new lesions. Currently using Dupixent for management. Advised to report any new lesions, especially with erythematous rings, to primary care physician.  Hypercholesterolemia Slightly  elevated cholesterol levels. No major concerns with liver or kidney function. Overall well-managed condition.  General Health Maintenance Colonoscopy recall 2027. Previous colonoscopy in 2017 was normal.  Follow-Up Advised to follow up with primary care physician regarding arthralgia, cephalalgia, and any new skin lesions. - Follow up with primary care physician for arthralgia and cephalalgia.   The patient was provided an opportunity to ask questions and all were answered. The patient agreed with the plan and demonstrated an  understanding of the instructions.  Lorena Rolling , MD    CC: Elvira Hammersmith, [place]

## 2023-09-04 ENCOUNTER — Encounter: Payer: Self-pay | Admitting: Gastroenterology

## 2023-10-17 ENCOUNTER — Other Ambulatory Visit: Payer: Self-pay | Admitting: Emergency Medicine

## 2023-10-17 DIAGNOSIS — E785 Hyperlipidemia, unspecified: Secondary | ICD-10-CM

## 2023-11-29 IMAGING — MG MM DIGITAL DIAGNOSTIC UNILAT*R* W/ TOMO W/ CAD
4 series · 4 of 12 positions shown · non-contrast
Comparison: Previous exam(s).

CLINICAL DATA: 70-year-old female for further evaluation of
possible RIGHT breast asymmetry on screening mammogram.

EXAM:
DIGITAL DIAGNOSTIC UNILATERAL RIGHT MAMMOGRAM WITH TOMOSYNTHESIS AND
CAD
TECHNIQUE: Right digital diagnostic mammography and breast tomosynthesis was
performed. The images were evaluated with computer-aided detection.

[R MLO synth-2D]
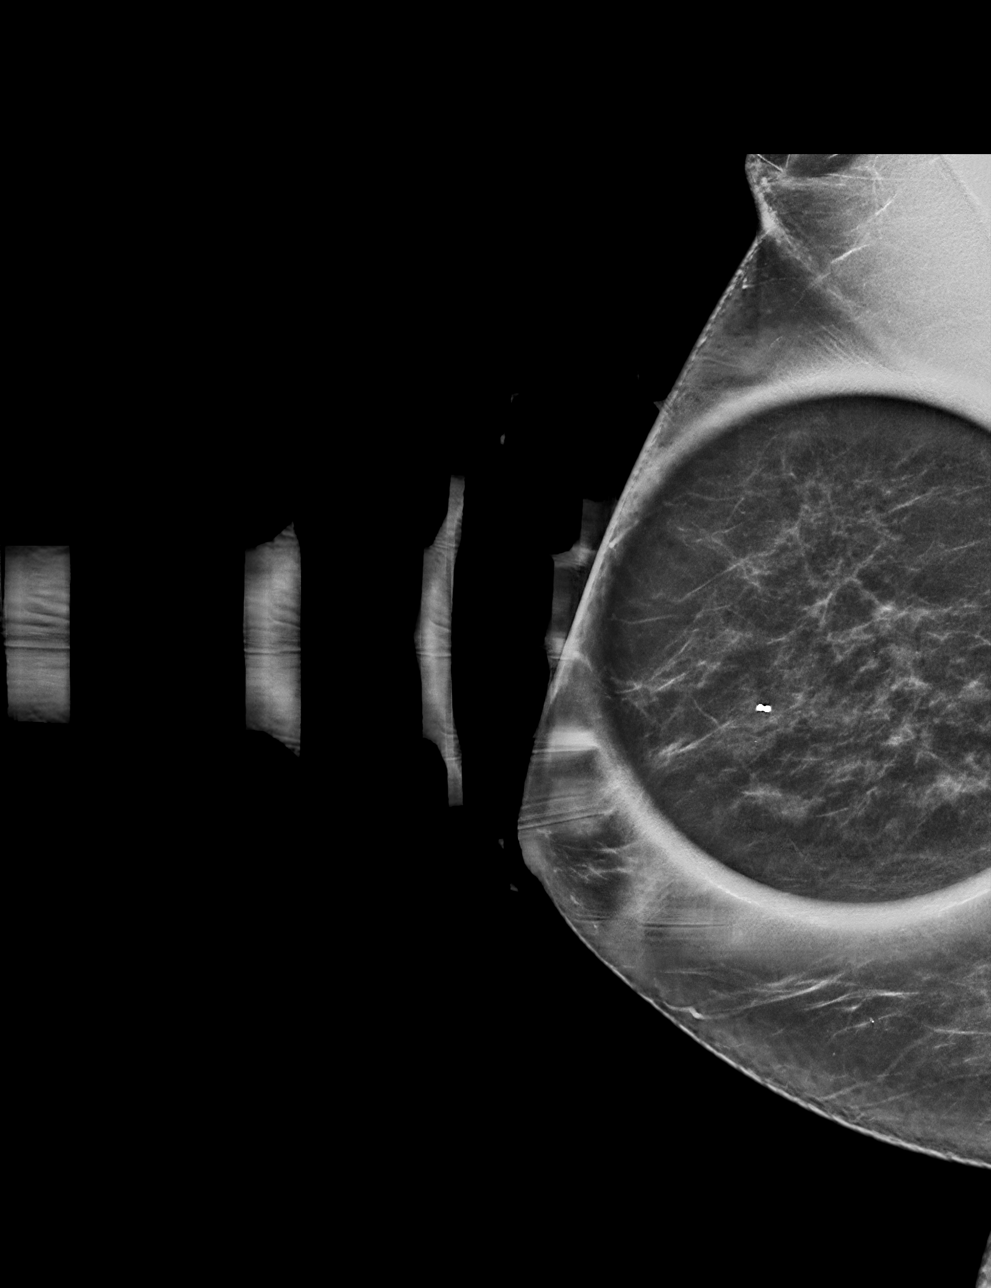

[R CC synth-2D]
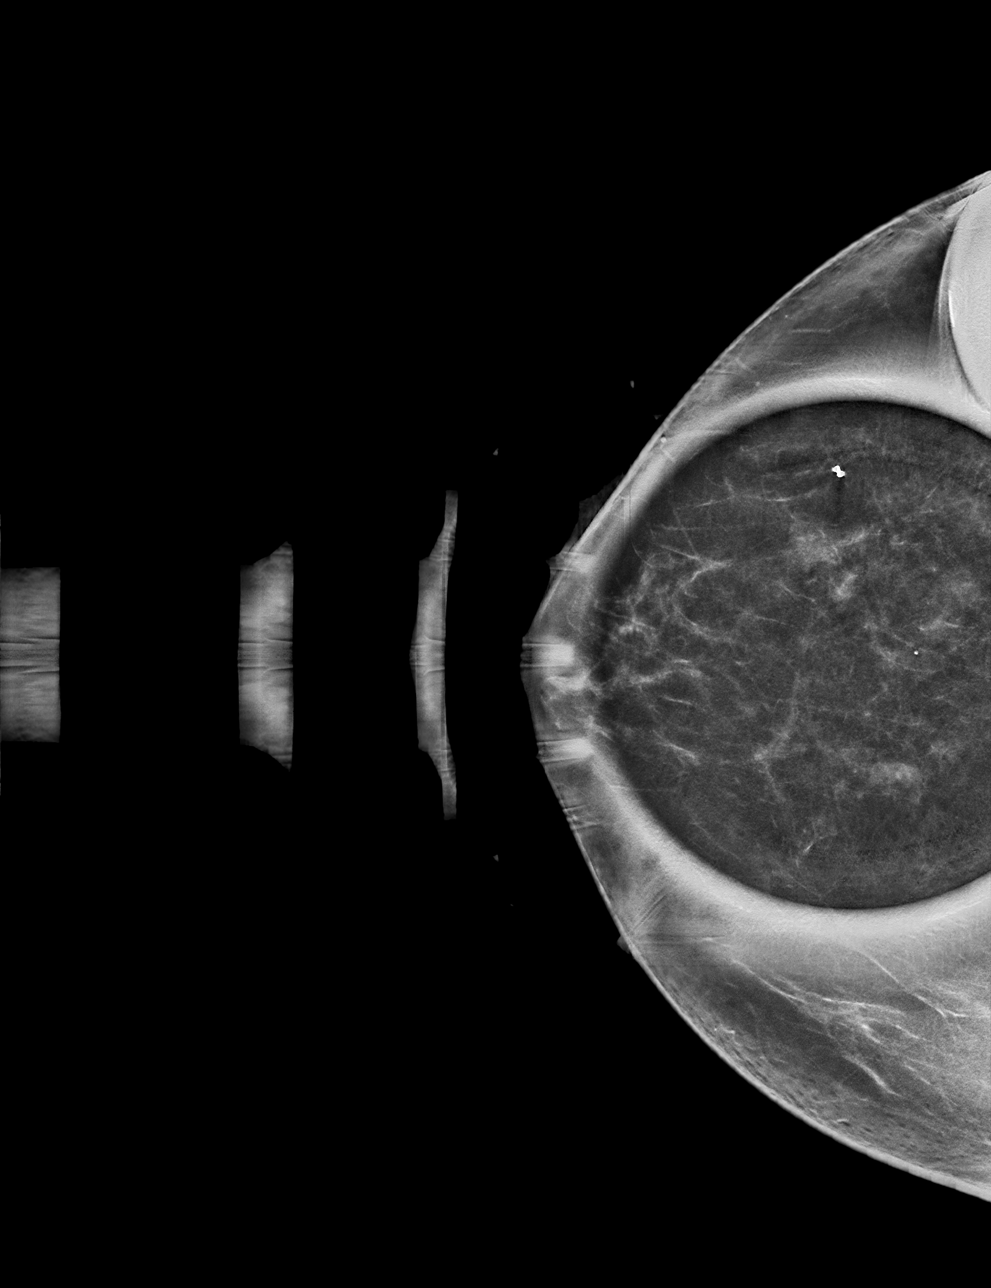

[R MLO tomo · tomo slice 32/63.0]
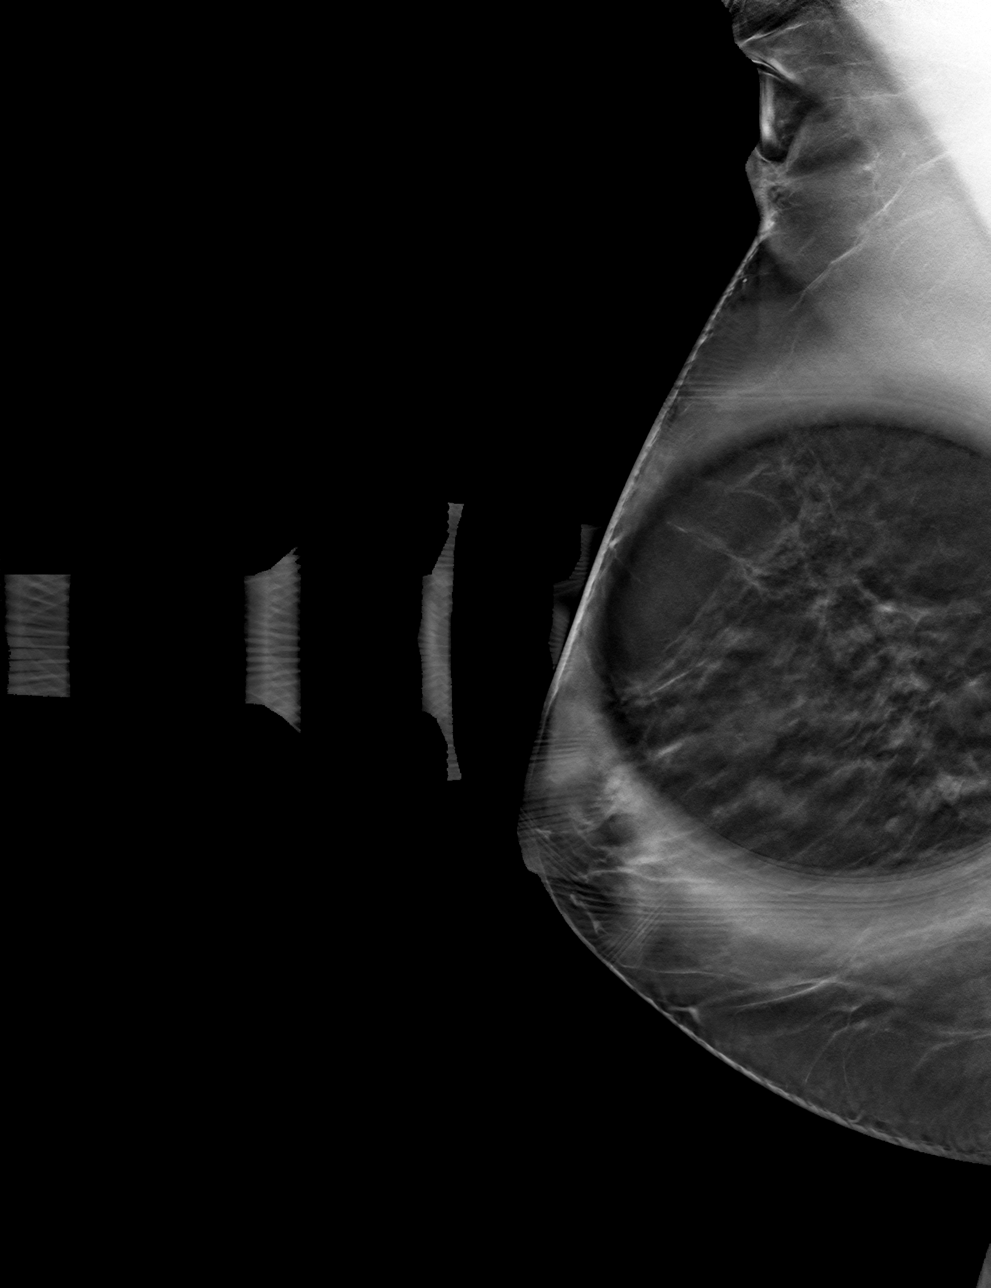

[R CC tomo · tomo slice 33/66.0]
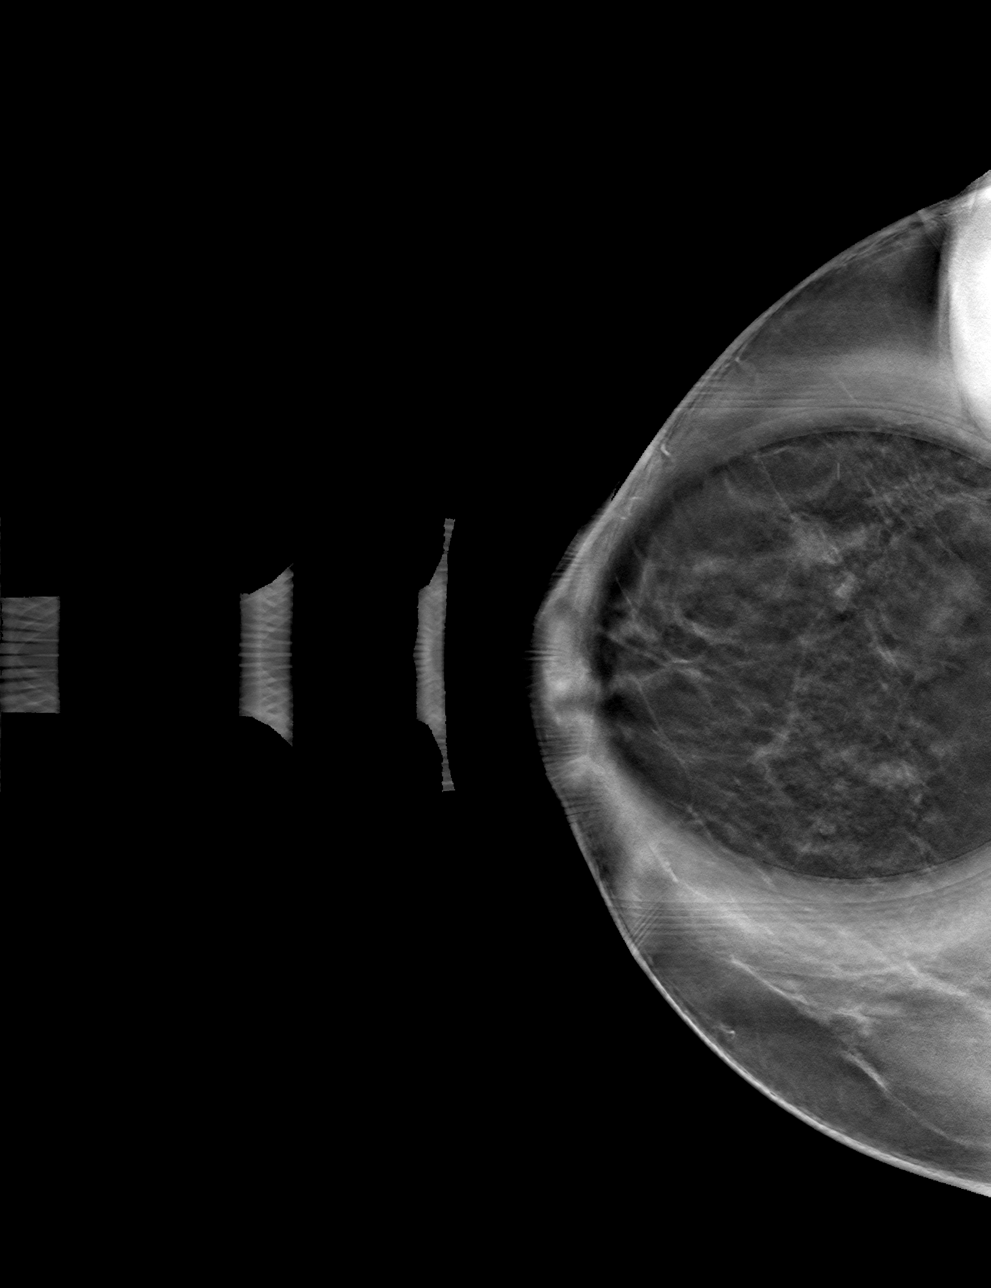

[4 of 12 positions shown; findings below may reference images not displayed]

ACR Breast Density Category b: There are scattered areas of
fibroglandular density.
FINDINGS: 2D/3D spot compression views of the RIGHT breast demonstrate
resolution of the RIGHT breast screening study asymmetry without
persistent suspicious abnormality in this area.
IMPRESSION: No persistent suspicious abnormality in the area of the screening
study finding.

RECOMMENDATION:
Bilateral screening mammogram in 1 year.

I have discussed the findings and recommendations with the patient.
If applicable, a reminder letter will be sent to the patient
regarding the next appointment.

BI-RADS CATEGORY  1: Negative.

## 2024-01-08 DIAGNOSIS — Z01812 Encounter for preprocedural laboratory examination: Secondary | ICD-10-CM | POA: Diagnosis not present

## 2024-01-16 ENCOUNTER — Other Ambulatory Visit: Payer: Self-pay | Admitting: Emergency Medicine

## 2024-01-16 DIAGNOSIS — E785 Hyperlipidemia, unspecified: Secondary | ICD-10-CM

## 2024-01-24 ENCOUNTER — Encounter (HOSPITAL_BASED_OUTPATIENT_CLINIC_OR_DEPARTMENT_OTHER): Payer: Self-pay

## 2024-01-24 ENCOUNTER — Other Ambulatory Visit: Payer: Self-pay

## 2024-01-24 ENCOUNTER — Observation Stay (HOSPITAL_BASED_OUTPATIENT_CLINIC_OR_DEPARTMENT_OTHER)
Admission: EM | Admit: 2024-01-24 | Discharge: 2024-01-27 | Disposition: A | Discharge status: Home / Self Care | Attending: Internal Medicine | Admitting: Internal Medicine

## 2024-01-24 ENCOUNTER — Emergency Department (HOSPITAL_BASED_OUTPATIENT_CLINIC_OR_DEPARTMENT_OTHER)

## 2024-01-24 DIAGNOSIS — J9601 Acute respiratory failure with hypoxia: Principal | ICD-10-CM | POA: Insufficient documentation

## 2024-01-24 DIAGNOSIS — D509 Iron deficiency anemia, unspecified: Secondary | ICD-10-CM | POA: Diagnosis not present

## 2024-01-24 DIAGNOSIS — F1090 Alcohol use, unspecified, uncomplicated: Secondary | ICD-10-CM | POA: Insufficient documentation

## 2024-01-24 DIAGNOSIS — I517 Cardiomegaly: Secondary | ICD-10-CM | POA: Diagnosis present

## 2024-01-24 DIAGNOSIS — K668 Other specified disorders of peritoneum: Secondary | ICD-10-CM | POA: Diagnosis not present

## 2024-01-24 DIAGNOSIS — R7303 Prediabetes: Secondary | ICD-10-CM | POA: Diagnosis not present

## 2024-01-24 DIAGNOSIS — Z79899 Other long term (current) drug therapy: Secondary | ICD-10-CM | POA: Diagnosis not present

## 2024-01-24 DIAGNOSIS — J22 Unspecified acute lower respiratory infection: Secondary | ICD-10-CM

## 2024-01-24 DIAGNOSIS — Z9889 Other specified postprocedural states: Secondary | ICD-10-CM | POA: Diagnosis not present

## 2024-01-24 DIAGNOSIS — E785 Hyperlipidemia, unspecified: Secondary | ICD-10-CM | POA: Diagnosis present

## 2024-01-24 DIAGNOSIS — R0902 Hypoxemia: Principal | ICD-10-CM

## 2024-01-24 DIAGNOSIS — R0602 Shortness of breath: Secondary | ICD-10-CM | POA: Diagnosis present

## 2024-01-24 DIAGNOSIS — R0600 Dyspnea, unspecified: Secondary | ICD-10-CM | POA: Diagnosis present

## 2024-01-24 DIAGNOSIS — F1010 Alcohol abuse, uncomplicated: Secondary | ICD-10-CM | POA: Diagnosis present

## 2024-01-24 LAB — URINALYSIS, W/ REFLEX TO CULTURE (INFECTION SUSPECTED)
Bacteria, UA: NONE SEEN
Bilirubin Urine: NEGATIVE
Glucose, UA: NEGATIVE mg/dL
Hgb urine dipstick: NEGATIVE
Ketones, ur: NEGATIVE mg/dL
Leukocytes,Ua: NEGATIVE
Nitrite: NEGATIVE
Protein, ur: NEGATIVE mg/dL
Specific Gravity, Urine: 1.021 (ref 1.005–1.030)
pH: 5.5 (ref 5.0–8.0)

## 2024-01-24 LAB — CBC WITH DIFFERENTIAL/PLATELET
Abs Immature Granulocytes: 0.06 K/uL (ref 0.00–0.07)
Basophils Absolute: 0.1 K/uL (ref 0.0–0.1)
Basophils Relative: 1 %
Eosinophils Absolute: 0.1 K/uL (ref 0.0–0.5)
Eosinophils Relative: 1 %
HCT: 35 % — ABNORMAL LOW (ref 36.0–46.0)
Hemoglobin: 11 g/dL — ABNORMAL LOW (ref 12.0–15.0)
Immature Granulocytes: 1 %
Lymphocytes Relative: 16 %
Lymphs Abs: 2 K/uL (ref 0.7–4.0)
MCH: 23 pg — ABNORMAL LOW (ref 26.0–34.0)
MCHC: 31.4 g/dL (ref 30.0–36.0)
MCV: 73.1 fL — ABNORMAL LOW (ref 80.0–100.0)
Monocytes Absolute: 1 K/uL (ref 0.1–1.0)
Monocytes Relative: 8 %
Neutro Abs: 8.9 K/uL — ABNORMAL HIGH (ref 1.7–7.7)
Neutrophils Relative %: 73 %
Platelets: 204 K/uL (ref 150–400)
RBC: 4.79 MIL/uL (ref 3.87–5.11)
RDW: 16 % — ABNORMAL HIGH (ref 11.5–15.5)
WBC: 12.1 K/uL — ABNORMAL HIGH (ref 4.0–10.5)
nRBC: 0 % (ref 0.0–0.2)

## 2024-01-24 LAB — COMPREHENSIVE METABOLIC PANEL WITH GFR
ALT: 20 U/L (ref 0–44)
AST: 24 U/L (ref 15–41)
Albumin: 4.1 g/dL (ref 3.5–5.0)
Alkaline Phosphatase: 71 U/L (ref 38–126)
Anion gap: 11 (ref 5–15)
BUN: 20 mg/dL (ref 8–23)
CO2: 23 mmol/L (ref 22–32)
Calcium: 9 mg/dL (ref 8.9–10.3)
Chloride: 98 mmol/L (ref 98–111)
Creatinine, Ser: 0.7 mg/dL (ref 0.44–1.00)
GFR, Estimated: >60 mL/min (ref >60)
Glucose, Bld: 171 mg/dL — ABNORMAL HIGH (ref 70–99)
Potassium: 4.1 mmol/L (ref 3.5–5.1)
Sodium: 132 mmol/L — ABNORMAL LOW (ref 135–145)
Total Bilirubin: 0.3 mg/dL (ref 0.0–1.2)
Total Protein: 6.8 g/dL (ref 6.5–8.1)

## 2024-01-24 LAB — PROTIME-INR
INR: 0.9 (ref 0.8–1.2)
Prothrombin Time: 12.6 s (ref 11.4–15.2)

## 2024-01-24 LAB — SARS CORONAVIRUS 2 BY RT PCR: SARS Coronavirus 2 by RT PCR: NEGATIVE

## 2024-01-24 LAB — LACTIC ACID, PLASMA
Lactic Acid, Venous: 2.6 mmol/L (ref 0.5–1.9)
Lactic Acid, Venous: 1.8 mmol/L (ref 0.5–1.9)

## 2024-01-24 MED ORDER — PIPERACILLIN-TAZOBACTAM 3.375 G IVPB 30 MIN
3.3750 g | Freq: Once | INTRAVENOUS | Status: AC
  Administered 2024-01-24: 3.375 g via INTRAVENOUS
  Filled 2024-01-24: qty 50

## 2024-01-24 MED ORDER — IOHEXOL 350 MG/ML SOLN
100.0000 mL | Freq: Once | INTRAVENOUS | Status: AC | PRN
  Administered 2024-01-24: 100 mL via INTRAVENOUS

## 2024-01-24 NOTE — ED Provider Notes (Signed)
 [place] EMERGENCY DEPARTMENT AT [facility] Provider Note   CSN: [id number] Arrival date & time: 01/24/24  8073     Patient presents with: Post-op Problem   Brenda [name] is a 73 y.o. female.   73 year old female with a prior history of asthma presents to the ED with a chief complaint of shortness of breath.  Patient had a tummy tuck, along with liposuction by Dr. Elisabeth Jointer yesterday.  She reports waking up today feeling really short of breath with any type of ambulation.  Then she felt she really could not catch her breath and has a hard time breathing at rest.  She tells me that she was given anticoagulants prior to surgery about 3 oral doses, she is currently not on any anticoagulation.  She does not have any prior history of blood clots.  She also endorses swelling to bilateral lower extremities.  She has had appropriate output from her drain.  She is not endorsing any abdominal pain, fever, nausea or vomiting.  The history is provided by the patient and medical records.       Prior to Admission medications   Medication Sig Start Date End Date Taking? Authorizing Provider  acetaminophen  (TYLENOL ) 500 MG tablet Take 500 mg by mouth every 6 (six) hours as needed (for pain.).    [provider]  albuterol  (VENTOLIN  HFA) 108 (90 Base) MCG/ACT inhaler Inhale 2 puffs into the lungs 2 (two) times daily for 5 days. 04/08/22 04/13/22  Fleming, Zelda W, NP  atorvastatin  (LIPITOR) 10 MG tablet Take 1 tablet by mouth once daily 01/16/24   Sagardia, Miguel Jose, MD  azithromycin  (ZITHROMAX ) 250 MG tablet Sig as indicated 07/24/23   Sagardia, Miguel Jose, MD  clobetasol cream (TEMOVATE) 0.05 %  10/14/15   [provider]  DUPIXENT 300 MG/2ML SOPN     [provider]  famotidine  (PEPCID ) 40 MG tablet Take 1 tablet (40 mg total) by mouth daily for 7 days. 11/17/21 11/24/21  Sagardia, Miguel Jose, MD  fluocinolone (SYNALAR) 0.01 % external solution Apply topically.  04/23/23   [provider]  fluticasone  (FLONASE ) 50 MCG/ACT nasal spray Place 2 sprays into both nostrils daily. 07/12/21   Rollene Almarie LABOR, MD  gabapentin  (NEURONTIN ) 100 MG capsule Take 2 capsules (200 mg total) by mouth at bedtime. 03/20/18   Smith, Zachary M, DO  hydrocortisone 2.5 % cream Apply topically. 08/19/21   [provider]  hydrOXYzine  (ATARAX ) 25 MG tablet Take 1 tablet (25 mg total) by mouth every 8 (eight) hours as needed (anxiety/itching.). 09/28/22   Sagardia, Miguel Jose, MD  metroNIDAZOLE (METROCREAM) 0.75 % cream Apply 1 application  topically in the morning and at bedtime. 01/03/18   [provider]  pantoprazole  (PROTONIX ) 40 MG tablet Take 1 tablet (40 mg total) by mouth daily. 05/21/23   Nandigam, Kavitha V, MD  triamcinolone  lotion (KENALOG ) 0.1 %  01/04/22   [provider]  valACYclovir (VALTREX) 500 MG tablet TAKE 1 TABLET BY MOUTH TWICE DAILY FOR 3 DAYS FOR GENITAL LESIONS    [provider]  Vitamin D , Ergocalciferol , (DRISDOL ) 1.25 MG (50000 UNIT) CAPS capsule Take 1 capsule (50,000 Units total) by mouth every 7 (seven) days. 05/13/19   Claudene Arthea HERO, DO    Allergies: Hydrocodone     Review of Systems  Constitutional:  Negative for chills and fever.  Respiratory:  Positive for shortness of breath.   Cardiovascular:  Positive for chest pain.  Gastrointestinal:  Negative for  abdominal pain, nausea and vomiting.  All other systems reviewed and are negative.   Updated Vital Signs BP 126/77   Pulse (!) 102   Temp 98.3 F (36.8 C) (Oral)   Resp 19   Wt 74.8 kg   SpO2 96%   BMI 26.63 kg/m   Physical Exam Vitals and nursing note reviewed.  Constitutional:      Comments: Appears uncomfortable.  HENT:     Head: Normocephalic and atraumatic.     Mouth/Throat:     Mouth: Mucous membranes are moist.  Eyes:     Pupils: Pupils are equal, round, and reactive to light.  Cardiovascular:     Rate and Rhythm:  Tachycardia present.     Comments: No pitting edema, no calf tenderness. Pulmonary:     Effort: Pulmonary effort is normal.     Comments: Tachypnea, speaking in short sentences. Abdominal:     Comments: Dressing in place, drain with some bright red blood present.  Reports normal output.  Musculoskeletal:     Right lower leg: No edema.     Left lower leg: No edema.  Skin:    General: Skin is warm and dry.  Neurological:     Mental Status: She is alert and oriented to person, place, and time.     (all labs ordered are listed, but only abnormal results are displayed) Labs Reviewed  COMPREHENSIVE METABOLIC PANEL WITH GFR - Abnormal; Notable for the following components:      Result Value   Sodium 132 (*)    Glucose, Bld 171 (*)    All other components within normal limits  LACTIC ACID, PLASMA - Abnormal; Notable for the following components:   Lactic Acid, Venous 2.6 (*)    All other components within normal limits  CBC WITH DIFFERENTIAL/PLATELET - Abnormal; Notable for the following components:   WBC 12.1 (*)    Hemoglobin 11.0 (*)    HCT 35.0 (*)    MCV 73.1 (*)    MCH 23.0 (*)    RDW 16.0 (*)    Neutro Abs 8.9 (*)    All other components within normal limits  SARS CORONAVIRUS 2 BY RT PCR  CULTURE, BLOOD (ROUTINE X 2)  CULTURE, BLOOD (ROUTINE X 2)  LACTIC ACID, PLASMA  PROTIME-INR  URINALYSIS, W/ REFLEX TO CULTURE (INFECTION SUSPECTED)    EKG: EKG Interpretation Date/Time:  Thursday January 24 2024 19:32:54 EDT Ventricular Rate:  106 PR Interval:  138 QRS Duration:  68 QT Interval:  338 QTC Calculation: 448 R Axis:   22  Text Interpretation: Sinus tachycardia Minimal voltage criteria for LVH, may be normal variant ( R in aVL ) Borderline ECG When compared with ECG of 13-Aug-2019 13:45, No significant change was found when compared to prior, faster rrate No STEMI Confirmed by Ginger Barefoot ([id number]) on 01/24/2024 8:16:05 PM  Radiology: CT ABDOMEN PELVIS W  CONTRAST Result Date: 01/24/2024 EXAM: CT ABDOMEN AND PELVIS WITH CONTRAST 01/24/2024 09:06:17 PM TECHNIQUE: CT of the abdomen and pelvis was performed with the administration of 100 mL of iohexol (OMNIPAQUE) 350 MG/ML injection. Multiplanar reformatted images are provided for review. Automated exposure control, iterative reconstruction, and/or weight-based adjustment of the mA/kV was utilized to reduce the radiation dose to as low as reasonably achievable. COMPARISON: None available. CLINICAL HISTORY: Abdominal pain, acute, nonlocalized. Pt states that she had a tummy tuck done yesterday and today she began to have SOB. Pt began to have swelling in bilateral legs. FINDINGS: LOWER CHEST: Patchy  bilateral lower lobe opacities, likely atelectasis. LIVER: Subcentimeter left hepatic cyst, benign. GALLBLADDER AND BILE DUCTS: Gallbladder is unremarkable. No biliary ductal dilatation. SPLEEN: No acute abnormality. PANCREAS: No acute abnormality. ADRENAL GLANDS: No acute abnormality. KIDNEYS, URETERS AND BLADDER: Subcentimeter bilateral renal cysts, benign. No follow-up recommended. No stones in the kidneys or ureters. No hydronephrosis. No perinephric or periureteral stranding. Urinary bladder is unremarkable. GI AND BOWEL: Stomach demonstrates no acute abnormality. Normal appendix (image 68). There is no bowel obstruction. PERITONEUM AND RETROPERITONEUM: No ascites. No free air. VASCULATURE: Aorta is normal in caliber. LYMPH NODES: No lymphadenopathy. REPRODUCTIVE ORGANS: The uterus is within normal limits. BONES AND SOFT TISSUES: Postprocedural changes including a drain along the lower anterior abdominal wall, with scattered foci of subcutaneous gas (image 32). Mild degenerative changes of the lower lumbar spine. No acute osseous abnormality. No focal soft tissue abnormality. IMPRESSION: 1. Postprocedural changes along the anterior abdominal wall. 2. No free air. 3. No acute findings in the abdomen or pelvis.  Electronically signed by: Pinkie Pebbles MD 01/24/2024 09:17 PM EDT RP Workstation: [id number]   CT Angio Chest PE W and/or Wo Contrast Result Date: 01/24/2024 EXAM: CTA CHEST 01/24/2024 09:06:17 PM TECHNIQUE: CTA of the chest was performed after the administration of intravenous contrast. Multiplanar reformatted images are provided for review. MIP images are provided for review. Automated exposure control, iterative reconstruction, and/or weight based adjustment of the mA/kV was utilized to reduce the radiation dose to as low as reasonably achievable. COMPARISON: Chest x-ray same day. CLINICAL HISTORY: Pulmonary embolism (PE) suspected, high prob. Pt states that she had a tummy tuck done yesterday and today she began to have SOB. Pt began to have swelling in bilateral legs. FINDINGS: PULMONARY ARTERIES: Pulmonary arteries are adequately opacified for evaluation. No acute pulmonary embolus. Main pulmonary artery is normal in caliber. MEDIASTINUM: The heart is mildly enlarged. Pericardium demonstrates no acute abnormality. There is no acute abnormality of the thoracic aorta. Circumscribed nonenhancing cystic area seen in the anterior mediastinum near the ascending aorta measuring 3.8 x 1.4 x 1.6 cm. LYMPH NODES: No mediastinal, hilar or axillary lymphadenopathy. LUNGS AND PLEURA: Bands of atelectasis in the bilateral lower lobes. No focal consolidation or pulmonary edema. No evidence of pleural effusion or pneumothorax. Mild elevation of the right hemidiaphragm. UPPER ABDOMEN: Limited images of the upper abdomen are unremarkable. SOFT TISSUES AND BONES: Subcutaneous emphysema and soft tissue densities are seen in the lower chest and lateral left chest wall compatible with patient's history of recent surgery. . Surgical clips in the left breast. Small sclerotic density in the T6 vertebral body measures 8 mm. IMPRESSION: 1. No pulmonary embolism. 2. Mildly enlarged heart. 3. Indeterminate cystic area in the  anterior mediastinum near the ascending aorta measuring 3.8 x 1.4 x 1.6 cm may represent a pericardial cyst or thymic cyst. Other etiologies are not excluded. 4. Bands of atelectasis in the bilateral lower lobes and mild elevation of the right hemidiaphragm. Electronically signed by: Greig Pique MD 01/24/2024 09:16 PM EDT RP Workstation: [id number]   DG Chest Port 1 View if patient is in a treatment room. Result Date: 01/24/2024 EXAM: 1 VIEW XRAY OF THE CHEST 01/24/2024 07:54:00 PM COMPARISON: None available. CLINICAL HISTORY: Suspected Sepsis. Pt states that she had a tummy tuck done yesterday and today she began to have SOB. Pt began to have swelling in bilateral legs. FINDINGS: LUNGS AND PLEURA: Low lung volumes. No focal pulmonary opacity. No pulmonary edema. No pleural effusion. No pneumothorax. HEART AND MEDIASTINUM:  No acute abnormality of the cardiac and mediastinal silhouettes. BONES AND SOFT TISSUES: Lucency beneath the right hemidiaphragm is likely related to bowe,l but indeterminate. No acute osseous abnormality. Surgery clips along the left lower chest. Subcutaneous gas along the right lateral abdominal wall likely postprocedural. IMPRESSION: 1. No acute cardiopulmonary abnormality. 2. Lucency beneath the right hemidiaphragm is favored to represent bowel. If clinically concerned for pneumoperitoneum, consider CT for further evaluation. 3. Subcutaneous gas along the right lateral abdominal wall, postprocedural. Electronically signed by: Pinkie Pebbles MD 01/24/2024 08:02 PM EDT RP Workstation: [id number]     Procedures   Medications Ordered in the ED  iohexol (OMNIPAQUE) 350 MG/ML injection 100 mL (100 mLs Intravenous Contrast Given 01/24/24 2043)  piperacillin-tazobactam (ZOSYN) IVPB 3.375 g (0 g Intravenous Stopped 01/24/24 2236)    Clinical Course as of 01/24/24 2308  Thu Jan 24, 2024  2021 WBC(!): 12.1 [JS]    Clinical Course User Index [JS] Sunni Richardson, PA-C                                  Medical Decision Making Amount and/or Complexity of Data Reviewed Labs: ordered. Decision-making details documented in ED Course. Radiology: ordered.  Risk Prescription drug management.    This patient presents to the ED for concern of postop problem, this involves a number of treatment options, and is a complaint that carries with it a high risk of complications and morbidity.  The differential diagnosis includes pulmonary embolism, infection, intractable pain.    Co morbidities: Discussed in HPI   Brief History:  See HPI.   EMR reviewed including pt PMHx, past surgical history and past visits to ER.   See HPI for more details   Lab Tests:  I ordered and independently interpreted labs.  The pertinent results include:    CBC with a slight leukocytosis of 12.1, hemoglobin is slightly decreased however she did have surgery yesterday.  CMP with mild hyponatremia, creatinine levels within normal limits.  LFTs are unremarkable.  Actiq  acid is elevated at 2.6.  Analysis without any nitrites or leukocytes to suggest infection.  Thana panel is negative for COVID-19.  Imaging Studies:  DG Chest xray showed: IMPRESSION:  1. No acute cardiopulmonary abnormality.  2. Lucency beneath the right hemidiaphragm is favored to represent bowel. If  clinically concerned for pneumoperitoneum, consider CT for further evaluation.  3. Subcutaneous gas along the right lateral abdominal wall, postprocedural.   Cardiac Monitoring:  The patient was maintained on a cardiac monitor.  I personally viewed and interpreted the cardiac monitored which showed an underlying rhythm of: Sinus tachycardia. EKG non-ischemic  Medicines ordered:  I ordered medication including IV Zosyn for abdominal pathology Reevaluation of the patient after these medicines showed that the patient improved I have reviewed the patients home medicines and have made adjustments as needed  Critical  Interventions:   patient's repeat lactic trending down after receiving IV Zosyn.  Consults:  10:03 PM I requested consultation with Dr. Craig Shank,  and discussed lab and imaging findings as well as pertinent plan -they unfortunately are unable to round on patient, however they are aware of patient in the hospital.  Reevaluation:  After the interventions noted above I re-evaluated patient and found that they have :improved  Social Determinants of Health:  The patient's social determinants of health were a factor in the care of this patient  Problem List / ED Course:  Presented  to ED with chief complaint of sudden onset of shortness of breath, she is POD #1 after a tummy tuck and liposuction by Dr. Craig Shank.  Tells me that she has been recuperating at home, all of a sudden began to feel short of breath with any type of ambulation.  Then the shortness of breath developed to be more so whenever she is at rest.  She came in tachycardic, hypoxic with oxygen saturations below 90%.  She was placed on 2 L nasal cannula by me with improvement of her O2 sats to 96%.  She denies any oxygen at baseline, she has not been sick recently, she is denying any cough or fevers. Evaluation she is not wheezing, however she is very tachypneic, speaking in short sentences.  She does have some tachycardia but no fever.  Strong concern for pulmonary embolism in the setting of postoperative shortness of breath. Lab work today show mild leukocytosis, hemoglobin stable.  CMP without any Electra derangement, current levels are markable.  Lactic acid first 1 was elevated at 2.6, received IV dosing for intra-abdominal pathology with repeat lactic improvement to 1.8 Chest x-ray obtained shows some concern for a pneumoperitoneum.  Patient is not having abdominal pain and appropriate drainage from her postoperative drain.  We did obtain a CT angio chest to rule out pulmonary embolism which was negative.  Further evaluation  with CT abdomen and pelvis which did not show any intra-abdominal pathology. I personally called patient's surgeon Dr. Craig Shank, we discussed her case, this does not seem to be a postoperative complication.  She is hypoxic, unsure what is triggering this, there is some questionable infiltrates however I do not feel that she has pneumonia at this time.  Respiratory panel is also negative. I do feel that patient warrants admission due to new onset of hypoxia. To Dr. Niels Hurst who will admit patient for further management.  Appreciate her assistance.  Dispostion:  Patient admitted for further workup due to new oxygen requirement.  Portions of this note were generated with Scientist, clinical (histocompatibility and immunogenetics). Dictation errors may occur despite best attempts at proofreading.   Final diagnoses:  Hypoxia    ED Discharge Orders     None          Maureen Broad, PA-C 01/24/24 2308    Tegeler, Lonni PARAS, MD 01/24/24 2322

## 2024-01-24 NOTE — ED Triage Notes (Signed)
 Pt states that she had a tummy tuck done yesterday and today she began to have SOB. Pt began to have swelling in bilateral legs.

## 2024-01-24 NOTE — ED Notes (Signed)
 Urine obtained by Triage

## 2024-01-25 ENCOUNTER — Observation Stay (HOSPITAL_COMMUNITY)

## 2024-01-25 ENCOUNTER — Encounter (HOSPITAL_COMMUNITY): Payer: Self-pay | Admitting: Internal Medicine

## 2024-01-25 DIAGNOSIS — K668 Other specified disorders of peritoneum: Secondary | ICD-10-CM | POA: Diagnosis present

## 2024-01-25 DIAGNOSIS — D509 Iron deficiency anemia, unspecified: Secondary | ICD-10-CM | POA: Diagnosis not present

## 2024-01-25 DIAGNOSIS — R0609 Other forms of dyspnea: Secondary | ICD-10-CM

## 2024-01-25 DIAGNOSIS — E785 Hyperlipidemia, unspecified: Secondary | ICD-10-CM | POA: Diagnosis not present

## 2024-01-25 DIAGNOSIS — R7303 Prediabetes: Secondary | ICD-10-CM | POA: Diagnosis not present

## 2024-01-25 DIAGNOSIS — R0600 Dyspnea, unspecified: Secondary | ICD-10-CM

## 2024-01-25 DIAGNOSIS — J9601 Acute respiratory failure with hypoxia: Secondary | ICD-10-CM | POA: Diagnosis not present

## 2024-01-25 DIAGNOSIS — I517 Cardiomegaly: Secondary | ICD-10-CM | POA: Diagnosis present

## 2024-01-25 LAB — CBC
HCT: 34.5 % — ABNORMAL LOW (ref 36.0–46.0)
Hemoglobin: 10.6 g/dL — ABNORMAL LOW (ref 12.0–15.0)
MCH: 22.9 pg — ABNORMAL LOW (ref 26.0–34.0)
MCHC: 30.7 g/dL (ref 30.0–36.0)
MCV: 74.7 fL — ABNORMAL LOW (ref 80.0–100.0)
Platelets: 193 K/uL (ref 150–400)
RBC: 4.62 MIL/uL (ref 3.87–5.11)
RDW: 16.1 % — ABNORMAL HIGH (ref 11.5–15.5)
WBC: 10.1 K/uL (ref 4.0–10.5)
nRBC: 0 % (ref 0.0–0.2)

## 2024-01-25 LAB — COMPREHENSIVE METABOLIC PANEL WITH GFR
ALT: 20 U/L (ref 0–44)
AST: 21 U/L (ref 15–41)
Albumin: 3.7 g/dL (ref 3.5–5.0)
Alkaline Phosphatase: 61 U/L (ref 38–126)
Anion gap: 9 (ref 5–15)
BUN: 16 mg/dL (ref 8–23)
CO2: 26 mmol/L (ref 22–32)
Calcium: 8.8 mg/dL — ABNORMAL LOW (ref 8.9–10.3)
Chloride: 103 mmol/L (ref 98–111)
Creatinine, Ser: 0.53 mg/dL (ref 0.44–1.00)
GFR, Estimated: >60 mL/min (ref >60)
Glucose, Bld: 97 mg/dL (ref 70–99)
Potassium: 3.8 mmol/L (ref 3.5–5.1)
Sodium: 138 mmol/L (ref 135–145)
Total Bilirubin: 0.5 mg/dL (ref 0.0–1.2)
Total Protein: 6.3 g/dL — ABNORMAL LOW (ref 6.5–8.1)

## 2024-01-25 LAB — PHOSPHORUS: Phosphorus: 1.9 mg/dL — ABNORMAL LOW (ref 2.5–4.6)

## 2024-01-25 LAB — ECHOCARDIOGRAM COMPLETE
Area-P 1/2: 3.61 cm2
S' Lateral: 2.3 cm
Weight: 2640 [oz_av]

## 2024-01-25 LAB — MAGNESIUM: Magnesium: 2.1 mg/dL (ref 1.7–2.4)

## 2024-01-25 LAB — HEMOGLOBIN A1C
Hgb A1c MFr Bld: 5.7 % — ABNORMAL HIGH (ref 4.8–5.6)
Mean Plasma Glucose: 116.89 mg/dL

## 2024-01-25 LAB — GLUCOSE, CAPILLARY
Glucose-Capillary: 107 mg/dL — ABNORMAL HIGH (ref 70–99)
Glucose-Capillary: 109 mg/dL — ABNORMAL HIGH (ref 70–99)

## 2024-01-25 LAB — PRO BRAIN NATRIURETIC PEPTIDE: Pro Brain Natriuretic Peptide: <50 pg/mL (ref <300.0)

## 2024-01-25 MED ORDER — OXYCODONE HCL 5 MG PO TABS: 5.0000 mg | ORAL_TABLET | Freq: Four times a day (QID) | ORAL | Status: DC | PRN

## 2024-01-25 MED ORDER — ALBUTEROL SULFATE (2.5 MG/3ML) 0.083% IN NEBU: 2.5000 mg | INHALATION_SOLUTION | Freq: Four times a day (QID) | RESPIRATORY_TRACT | Status: DC | PRN

## 2024-01-25 MED ORDER — PIPERACILLIN-TAZOBACTAM 3.375 G IVPB 30 MIN: 3.3750 g | Freq: Three times a day (TID) | INTRAVENOUS | Status: DC

## 2024-01-25 MED ORDER — ATORVASTATIN CALCIUM 10 MG PO TABS
10.0000 mg | ORAL_TABLET | Freq: Every day | ORAL | Status: DC
  Administered 2024-01-25 – 2024-01-27 (×3): 10 mg via ORAL
  Filled 2024-01-25 (×3): qty 1

## 2024-01-25 MED ORDER — GUAIFENESIN ER 600 MG PO TB12
600.0000 mg | ORAL_TABLET | Freq: Two times a day (BID) | ORAL | Status: DC
  Administered 2024-01-25 – 2024-01-27 (×4): 600 mg via ORAL
  Filled 2024-01-25 (×4): qty 1

## 2024-01-25 MED ORDER — INSULIN ASPART 100 UNIT/ML IJ SOLN
0.0000 [IU] | Freq: Three times a day (TID) | INTRAMUSCULAR | Status: DC
  Administered 2024-01-26 – 2024-01-27 (×3): 2 [IU] via SUBCUTANEOUS

## 2024-01-25 MED ORDER — KETOROLAC TROMETHAMINE 15 MG/ML IJ SOLN
15.0000 mg | Freq: Four times a day (QID) | INTRAMUSCULAR | Status: AC
  Administered 2024-01-25 – 2024-01-26 (×3): 15 mg via INTRAVENOUS
  Filled 2024-01-25 (×3): qty 1

## 2024-01-25 MED ORDER — HYDROXYZINE HCL 25 MG PO TABS
25.0000 mg | ORAL_TABLET | Freq: Once | ORAL | Status: AC
  Administered 2024-01-25: 25 mg via ORAL
  Filled 2024-01-25: qty 1

## 2024-01-25 MED ORDER — K PHOS MONO-SOD PHOS DI & MONO 155-852-130 MG PO TABS
500.0000 mg | ORAL_TABLET | Freq: Four times a day (QID) | ORAL | Status: AC
  Administered 2024-01-25 – 2024-01-26 (×4): 500 mg via ORAL
  Filled 2024-01-25 (×4): qty 2

## 2024-01-25 MED ORDER — PANTOPRAZOLE SODIUM 40 MG PO TBEC
40.0000 mg | DELAYED_RELEASE_TABLET | Freq: Every day | ORAL | Status: DC
  Administered 2024-01-25 – 2024-01-27 (×3): 40 mg via ORAL
  Filled 2024-01-25 (×3): qty 1

## 2024-01-25 MED ORDER — PIPERACILLIN-TAZOBACTAM 3.375 G IVPB
3.3750 g | Freq: Three times a day (TID) | INTRAVENOUS | Status: DC
  Administered 2024-01-25 – 2024-01-27 (×5): 3.375 g via INTRAVENOUS
  Filled 2024-01-25 (×6): qty 50

## 2024-01-25 MED ORDER — IPRATROPIUM-ALBUTEROL 0.5-2.5 (3) MG/3ML IN SOLN
3.0000 mL | Freq: Three times a day (TID) | RESPIRATORY_TRACT | Status: DC
  Administered 2024-01-25 – 2024-01-27 (×6): 3 mL via RESPIRATORY_TRACT
  Filled 2024-01-25 (×6): qty 3

## 2024-01-25 MED ORDER — BUDESONIDE 0.5 MG/2ML IN SUSP
0.5000 mg | Freq: Two times a day (BID) | RESPIRATORY_TRACT | Status: DC
  Administered 2024-01-25 – 2024-01-27 (×4): 0.5 mg via RESPIRATORY_TRACT
  Filled 2024-01-25 (×4): qty 2

## 2024-01-25 NOTE — ED Notes (Signed)
 RT requested a decrease to the O2. Decreased to 1lpm..SABRA

## 2024-01-25 NOTE — ED Notes (Addendum)
 Pts surgeon arrived on site to talk to the Pt/Provider... Provider informed... Surgeon evaluated PT.SABRASABRASABRA

## 2024-01-25 NOTE — ED Notes (Signed)
 JP drain emptied by Pt, .SABRASABRA

## 2024-01-25 NOTE — Progress Notes (Signed)
  Echocardiogram 2D Echocardiogram has been performed.  Tinnie FORBES Gosling RDCS 01/25/2024, 3:17 PM

## 2024-01-25 NOTE — ED Notes (Signed)
 Surgeon and EDP requested Pt to ambulate around the room to help with her breathing.SABRASABRA

## 2024-01-25 NOTE — H&P (Signed)
 History and Physical    Patient: Brenda [name] FMW:[id number] DOB: 1950-05-30 DOA: 01/24/2024 DOS: the patient was seen and examined on 01/25/2024 PCP: Purcell Emil Schanz, MD  Patient coming from: Home  Chief Complaint:  Chief Complaint  Patient presents with   Post-op Problem   HPI: Brenda [name] is a 73 y.o. female with medical history significant of abdominal/pelvic mass, seasonal allergies, iron deficiency anemia, arthritis of left shoulder, both hips and both knees, spinal stenosis, left lumbar radiculopathy, mucosal polyp of cervix-prediabetes with hemoglobin A1c at 6.1% on 07/24/2023, GERD, mild hearing loss, hyperlipidemia, osteopenia, palpitations, history of pneumonia, right thyroid  nodule who underwent liposuction with abdominoplasty 2 days ago with Dr. Craig Shank who presented yesterday evening.  The emergency department due to dyspnea and bilateral lower extremity edema since earlier in the day.  98.3 F, pulse 91, respiration 21, BP 141/73 mmHg and O2 sat 91% on room air.  The patient was placed on nasal cannula oxygen and received Zosyn 3.375 g IV.  Lab work: Urinalysis was unremarkable.  CBC showed a white count of 12.1, hemoglobin 11.0 g/dL and platelets 795.  PT 12.6 and INR 0.9.  Lactic acid is 2.6 and 1.8 mmol/L.  CMP showed a glucose of 171 mg/dL, the rest of the CMP measurements were normal after sodium correction.  Coronavirus PCR was negative.  Imaging: Portable 1 view chest radiograph with no acute cardiopulmonary abnormality.  Lucency beneath the right hemidiaphragm favored to represent bowel, but suspicious for pneumoperitoneum which we need CT evaluation.  There is subcutaneous gas along the right lateral abdominal wall, postprocedural.   Review of Systems: As mentioned in the history of present illness. All other systems reviewed and are negative. Past Medical History:  Diagnosis Date   ABDOMINAL/PELVIC SWELLING MASS/LUMP UNSPEC SITE 09/18/2007   ACUTE URIS OF  UNSPECIFIED SITE 06/06/2007   Allergy    Anemia    Anxiety    no meds   Arthritis of hip    08/13/2019: per patient both hips   Arthritis of knee, left    Arthritis of right knee    ASTHMATIC BRONCHITIS, ACUTE 05/11/2008   DM 09/18/2007   borderline -diet controlled- no meds   GERD (gastroesophageal reflux disease)    Glucose intolerance (impaired glucose tolerance)    HEARING LOSS 09/15/2009    mild -no hearing aids   HYPERLIPIDEMIA 04/25/2010   OSTEOPENIA 04/20/2007   Palpitations 03/13/2008   Pneumonia    THYROID  NODULE, RIGHT 05/11/2008   Past Surgical History:  Procedure Laterality Date   ABDOMINAL SURGERY     BREAST BIOPSY Left 05/22/2019   BREAST BIOPSY Left 06/2020   DILATION AND CURETTAGE, DIAGNOSTIC / THERAPEUTIC     HAMMER TOE SURGERY     RIGHT   HERNIA REPAIR  1992   KNEE SURGERY  1991   Left   Left Knee cyst  1985   Lymph Node Removal     RADIOACTIVE SEED GUIDED EXCISIONAL BREAST BIOPSY Left 08/19/2019   Procedure: LEFT BREAST RADIOACTIVE SEED GUIDED EXCISIONAL BREAST BIOPSY;  Surgeon: Aron Shoulders, MD;  Location: [facility] OR;  Service: General;  Laterality: Left;   Social History:  reports that she has never smoked. She has never been exposed to tobacco smoke. She has never used smokeless tobacco. She reports current alcohol use of about 14.0 standard drinks of alcohol per week. She reports that she does not use drugs.  Allergies  Allergen Reactions   Hydrocodone  Other (See Comments)  drowsiness    Family History  Problem Relation Age of Onset   Cancer Mother        Breast Cancer   Breast cancer Mother    Cancer Father        Stomach Cancer   Stomach cancer Father    Cancer Sister        Colon Cancer   Breast cancer Sister    Kidney disease Brother        Kidney Transplant   Colon cancer Neg Hx    Esophageal cancer Neg Hx    Rectal cancer Neg Hx     Prior to Admission medications   Medication Sig Start Date End Date Taking? Authorizing Provider   acetaminophen  (TYLENOL ) 500 MG tablet Take 500 mg by mouth every 6 (six) hours as needed (for pain.).    [provider]  albuterol  (VENTOLIN  HFA) 108 (90 Base) MCG/ACT inhaler Inhale 2 puffs into the lungs 2 (two) times daily for 5 days. 04/08/22 04/13/22  Fleming, Zelda W, NP  atorvastatin  (LIPITOR) 10 MG tablet Take 1 tablet by mouth once daily 01/16/24   Sagardia, Miguel Jose, MD  azithromycin  (ZITHROMAX ) 250 MG tablet Sig as indicated 07/24/23   Sagardia, Miguel Jose, MD  clobetasol cream (TEMOVATE) 0.05 %  10/14/15   [provider]  DUPIXENT 300 MG/2ML SOPN     [provider]  famotidine  (PEPCID ) 40 MG tablet Take 1 tablet (40 mg total) by mouth daily for 7 days. 11/17/21 11/24/21  Sagardia, Miguel Jose, MD  fluocinolone (SYNALAR) 0.01 % external solution Apply topically. 04/23/23   [provider]  fluticasone  (FLONASE ) 50 MCG/ACT nasal spray Place 2 sprays into both nostrils daily. 07/12/21   Rollene Almarie LABOR, MD  gabapentin  (NEURONTIN ) 100 MG capsule Take 2 capsules (200 mg total) by mouth at bedtime. 03/20/18   Smith, Zachary M, DO  hydrocortisone 2.5 % cream Apply topically. 08/19/21   [provider]  hydrOXYzine  (ATARAX ) 25 MG tablet Take 1 tablet (25 mg total) by mouth every 8 (eight) hours as needed (anxiety/itching.). 09/28/22   Sagardia, Miguel Jose, MD  metroNIDAZOLE (METROCREAM) 0.75 % cream Apply 1 application  topically in the morning and at bedtime. 01/03/18   [provider]  pantoprazole  (PROTONIX ) 40 MG tablet Take 1 tablet (40 mg total) by mouth daily. 05/21/23   Nandigam, Kavitha V, MD  triamcinolone  lotion (KENALOG ) 0.1 %  01/04/22   [provider]  valACYclovir (VALTREX) 500 MG tablet TAKE 1 TABLET BY MOUTH TWICE DAILY FOR 3 DAYS FOR GENITAL LESIONS    [provider]  Vitamin D , Ergocalciferol , (DRISDOL ) 1.25 MG (50000 UNIT) CAPS capsule Take 1 capsule (50,000 Units total) by mouth every 7 (seven) days.  05/13/19   Claudene Arthea HERO, DO    Physical Exam: Vitals:   01/25/24 0900 01/25/24 1030 01/25/24 1145 01/25/24 1253  BP: 125/67 121/76  132/74  Pulse: 87 (!) 105 (!) 106 (!) 107  Resp: 18 19 (!) 22 16  Temp:   98.6 F (37 C) 98.4 F (36.9 C)  TempSrc:   Oral   SpO2: 96% 95% 95% 97%  Weight:       Physical Exam Vitals and nursing note reviewed.  Constitutional:      General: She is awake. She is not in acute distress.    Appearance: She is ill-appearing.     Interventions: Nasal cannula in place.  HENT:     Head: Normocephalic.     Nose:  No rhinorrhea.     Mouth/Throat:     Mouth: Mucous membranes are moist.  Eyes:     General: No scleral icterus.    Pupils: Pupils are equal, round, and reactive to light.  Neck:     Vascular: No JVD.  Cardiovascular:     Rate and Rhythm: Normal rate and regular rhythm.     Heart sounds: S1 normal and S2 normal.  Pulmonary:     Effort: Pulmonary effort is normal.     Breath sounds: Wheezing present. No rhonchi or rales.     Comments: Subtle wheezing. Abdominal:     General: Bowel sounds are normal. There is no distension.     Palpations: Abdomen is soft.     Tenderness: There is no abdominal tenderness. There is no right CVA tenderness or left CVA tenderness.     Comments: Patient is wearing an   Musculoskeletal:     Cervical back: Neck supple.     Right lower leg: No edema.     Left lower leg: No edema.  Skin:    General: Skin is warm and dry.  Neurological:     General: No focal deficit present.     Mental Status: She is alert and oriented to person, place, and time.  Psychiatric:        Mood and Affect: Mood normal.        Behavior: Behavior normal. Behavior is cooperative.     Data Reviewed:  Results are pending, will review when available.  EKG: Vent. rate 106 BPM PR interval 138 ms QRS duration 68 ms QT/QTcB 338/448 ms P-R-T axes 46 22 9 Sinus tachycardia Minimal voltage criteria for LVH, may be normal variant (  R in aVL ) Borderline ECG  Assessment and Plan: Principal Problem:   Acute dyspnea Likely multifactorial: (Deconditioning, reactive airways, low Hgb). The patient is also wearing a tight abdominal binder. Will treat for reactive airways. -Supplemental oxygen as needed. -Schedule and as needed bronchodilators. - Budesonide 0.5 mg via neb twice daily. Will obtain an echocardiogram.  Active Problems:   Cardiomegaly proBNP is normal. Will check an echocardiogram.    Pneumoperitoneum In the setting of recent procedure. I will continue to monitor clinically.    Microcytic anemia Monitor hematocrit and hemoglobin. Will check anemia panel with morning labs.    Hyperlipidemia Continue atorvastatin .    Prediabetes Carbohydrate modified diet. CBG monitoring with RI SS. Check hemoglobin A1c.    Alcohol abuse In remission.    Hypophosphatemia Supplemented.     Advance Care Planning:   Code Status: Full Code   Consults:   Family Communication:   Severity of Illness: The appropriate patient status for this patient is OBSERVATION. Observation status is judged to be reasonable and necessary in order to provide the required intensity of service to ensure the patient's safety. The patient's presenting symptoms, physical exam findings, and initial radiographic and laboratory data in the context of their medical condition is felt to place them at decreased risk for further clinical deterioration. Furthermore, it is anticipated that the patient will be medically stable for discharge from the hospital within 2 midnights of admission.   Author: Alm Dorn Castor, MD 01/25/2024 1:31 PM  For on call review [email or web address].   This document was prepared using Dragon voice recognition software and may contain some unintended transcription errors.

## 2024-01-25 NOTE — Progress Notes (Signed)
 IS was given to pt. Pt ding well at this time.

## 2024-01-25 NOTE — ED Notes (Signed)
 During exam, Pt complained of heaviness to both knees, mostly on the left knee... PMS intact in legs... Provider informed.SABRASABRA

## 2024-01-25 NOTE — ED Notes (Signed)
 Called [facility] for transport

## 2024-01-25 NOTE — ED Notes (Signed)
 Purple man green.

## 2024-01-25 NOTE — ED Notes (Signed)
 Inspiromety performed by RRT.

## 2024-01-25 NOTE — Progress Notes (Signed)
 I saw patient this morning in her bed at the [facility].  At that time she was resting comfortably in no acute distress.  She was satting in the mid to high 90s on 2 L nasal cannula.  On exam her abdomen looked completely appropriate for 2 days after a abdominoplasty.  Her incision was intact with minimal drainage.  Minimal drain output.  No visible subcutaneous fluid.  All of her skin appeared healthy.  Certainly no signs of infection.  She does have mild lower extremity swelling that is symmetric and not unusual for her time point.  No focal areas of leg tenderness that I could elicit.  Seems like initial workup has ruled out pulmonary embolism and cardiac pathology as far as I can tell.  I do think it is likely that she has some degree of hypoventilation after the abdominoplasty.  I would imagine in her case that there is some pain with taking a deep breath given the tightening of the abdominal wall that was carried out during the surgery.  This could discourage her from taking deep breaths and result in some low-level hypoventilation.  I suggested to her to try and do the incentive spirometer is much as possible and to move around by walking as I think both of those things will encourage ventilation.  She does not appear to be in a significant amount of pain at least at rest but good pain control would also be helpful if needed.  I think it is reasonable to observe her while working on those respiratory functional components and to carry out any additional workup as indicated by our medicine colleagues.  I am certainly available to reevaluate but do not see any direct surgical problem at this time.  Please call me with any questions or concerns.

## 2024-01-25 NOTE — ED Notes (Signed)
 Conducted trial to titrate oxygen off; @0100  O2 Sat 96 % on 2LNC; @0104  O 2 Sat 90% room air; oxygen re-initiated to 2LNC

## 2024-01-26 DIAGNOSIS — R0600 Dyspnea, unspecified: Secondary | ICD-10-CM | POA: Diagnosis not present

## 2024-01-26 LAB — COMPREHENSIVE METABOLIC PANEL WITH GFR
ALT: 17 U/L (ref 0–44)
AST: 17 U/L (ref 15–41)
Albumin: 3.3 g/dL — ABNORMAL LOW (ref 3.5–5.0)
Alkaline Phosphatase: 57 U/L (ref 38–126)
Anion gap: 9 (ref 5–15)
BUN: 18 mg/dL (ref 8–23)
CO2: 27 mmol/L (ref 22–32)
Calcium: 8.2 mg/dL — ABNORMAL LOW (ref 8.9–10.3)
Chloride: 103 mmol/L (ref 98–111)
Creatinine, Ser: 0.57 mg/dL (ref 0.44–1.00)
GFR, Estimated: >60 mL/min (ref >60)
Glucose, Bld: 105 mg/dL — ABNORMAL HIGH (ref 70–99)
Potassium: 3.6 mmol/L (ref 3.5–5.1)
Sodium: 138 mmol/L (ref 135–145)
Total Bilirubin: 0.6 mg/dL (ref 0.0–1.2)
Total Protein: 5.8 g/dL — ABNORMAL LOW (ref 6.5–8.1)

## 2024-01-26 LAB — GLUCOSE, CAPILLARY
Glucose-Capillary: 116 mg/dL — ABNORMAL HIGH (ref 70–99)
Glucose-Capillary: 140 mg/dL — ABNORMAL HIGH (ref 70–99)
Glucose-Capillary: 126 mg/dL — ABNORMAL HIGH (ref 70–99)
Glucose-Capillary: 122 mg/dL — ABNORMAL HIGH (ref 70–99)

## 2024-01-26 LAB — CBC
HCT: 33.2 % — ABNORMAL LOW (ref 36.0–46.0)
Hemoglobin: 10.3 g/dL — ABNORMAL LOW (ref 12.0–15.0)
MCH: 23.3 pg — ABNORMAL LOW (ref 26.0–34.0)
MCHC: 31 g/dL (ref 30.0–36.0)
MCV: 75.1 fL — ABNORMAL LOW (ref 80.0–100.0)
Platelets: 182 K/uL (ref 150–400)
RBC: 4.42 MIL/uL (ref 3.87–5.11)
RDW: 16 % — ABNORMAL HIGH (ref 11.5–15.5)
WBC: 7.3 K/uL (ref 4.0–10.5)
nRBC: 0 % (ref 0.0–0.2)

## 2024-01-26 MED ORDER — FUROSEMIDE 10 MG/ML IJ SOLN
20.0000 mg | Freq: Once | INTRAMUSCULAR | Status: DC
  Filled 2024-01-26: qty 2

## 2024-01-26 MED ADMIN — Furosemide Tab 40 MG: 40 mg | ORAL | NDC 00054829925

## 2024-01-26 MED FILL — Furosemide Tab 40 MG: 40.0000 mg | ORAL | Qty: 1 | Status: AC

## 2024-01-26 NOTE — Care Management Obs Status (Signed)
 MEDICARE OBSERVATION STATUS NOTIFICATION   Patient Details  Name: Brenda [name] MRN: [id number] Date of Birth: 1950-07-11   Medicare Observation Status Notification Given:  Yes    Sonda Manuella Quill, RN 01/26/2024, 5:19 PM

## 2024-01-26 NOTE — Progress Notes (Signed)
 Loss of IV access this afternoon. Waiting for placement by IV team. MD Cheryle made aware of being unable to hang IV abx or give 1x dose of IV lasix. Lasix order changed to oral.

## 2024-01-26 NOTE — Progress Notes (Signed)
 SATURATION QUALIFICATIONS: (This note is used to comply with regulatory documentation for home oxygen)  Patient Saturations on Room Air at Rest = 93%  Patient Saturations on Room Air while Ambulating = 88%  Patient Saturations on 2 Liters of oxygen while Ambulating = 93%  Please briefly explain why patient needs home oxygen: Patient requiring oxygen due to decreased oxygen saturations while ambulating.

## 2024-01-26 NOTE — Progress Notes (Signed)
 IP CM order received for Heart Failure Home Health Screening. Patient has been reviewed and does not meet criteria.

## 2024-01-26 NOTE — Progress Notes (Signed)
 PROGRESS NOTE    Brenda [name]  FMW:[id number] DOB: 1951-01-10 DOA: 01/24/2024 PCP: Purcell Emil Schanz, MD   Brief Narrative:  73 year old female with history of iron deficiency anemia, spinal stenosis, prediabetes, GERD, mild hearing loss, hyperlipidemia, osteopenia, palpitations, right thyroid  nodule who underwent a liposuction with abdominoplasty 2 days prior to presentation with Dr. Craig pace presented with worsening shortness of breath and bilateral lower extremity edema.  On presentation, patient required supplemental oxygen.  WBC of 12.1.  Lactic acid 2.6 and 1.8.  Coronavirus PCR negative.  Chest x-ray showed no acute cardiopulmonary abnormality.  I contacted CTA chest showed no acute pulm embolism but showed bilateral lower lobe atelectasis.  CT of abdomen and pelvis with contrast showed postprocedural changes along the anterior abdominal wall with no free air and no acute findings in the abdomen or pelvis. 2D echo showed EF of 65% with grade 1 diastolic dysfunction.  Assessment & Plan:   Acute hypoxic respiratory failure Possible bilateral lower lobe atelectasis -Presented with worsening dyspnea and was tachypneic intermittently on presentation requiring supplemental oxygen.  Still requiring 2 to 3 L oxygen via nasal cannula.  Wean off as able. - Imaging as above with no evidence of pulmonary embolism but possible bilateral lower lobe atelectasis.  Continue incentive spirometry. - Continue current nebs.  Will give a dose of IV Lasix 20 mg if blood pressure allows.  History of recent liposuction with abdominoplasty - Done 2 days prior to presentation by Dr. Craig Shank.patient was seen by Dr. Shank on presentation at the ED: Patient currently does not have any surgical complications.  Outpatient follow-up with Dr. Shank.  Wound care as per Dr. Shank. -CT abdomen unremarkable  Microcytic anemia - Hemoglobin stable.  No signs of bleeding.  Monitor intermittently    Hyperlipidemia - Continue statin  Prediabetes - A1c 5.7.  Blood sugars stable.  Outpatient follow-up  Hypophosphatemia - No labs today  DVT prophylaxis: SCDs Code Status: Full Family Communication: None at bedside Disposition Plan: Status is: Observation The patient will require care spanning > 2 midnights and should be moved to inpatient because: Severity of illness.  Still requiring supplemental oxygen  Consultants: Dr. Craig Shank  Procedures: None  Antimicrobials: None   Subjective: Patient seen and examined at bedside.  He is slightly better.  Still short of breath with exertion.  Denies any current chest pain.  No fever or vomiting reported.  Objective: Vitals:   01/26/24 0546 01/26/24 0930 01/26/24 1124 01/26/24 1132  BP:    111/73  Pulse:    (!) 117  Resp:    16  Temp:      TempSrc:      SpO2:  96% 93% (!) 87%  Weight: 75.5 kg     Height:        Intake/Output Summary (Last 24 hours) at 01/26/2024 1419 Last data filed at 01/26/2024 0803 Gross per 24 hour  Intake 480 ml  Output 175 ml  Net 305 ml   Filed Weights   01/24/24 1935 01/25/24 1841 01/26/24 0546  Weight: 74.8 kg 67.5 kg 75.5 kg    Examination:  General exam: Appears calm and comfortable.  Elderly female sitting on chair. Respiratory system: Bilateral decreased breath sounds at bases with scattered crackles and intermittent tachypnea Cardiovascular system: S1 & S2 heard, Rate controlled Gastrointestinal system: Abdomen is nondistended, soft and nontender. Normal bowel sounds heard.  JP drain present.  Abdominal binder present Extremities: No cyanosis, clubbing; bilateral lower extremity edema present Central  nervous system: Alert and oriented. No focal neurological deficits. Moving extremities Skin: No rashes, lesions or ulcers Psychiatry: Judgement and insight appear normal. Mood & affect appropriate.     Data Reviewed: I have personally reviewed following labs and imaging  studies  CBC: Recent Labs  Lab 01/24/24 1943 01/25/24 1342 01/26/24 0705  WBC 12.1* 10.1 7.3  NEUTROABS 8.9*  --   --   HGB 11.0* 10.6* 10.3*  HCT 35.0* 34.5* 33.2*  MCV 73.1* 74.7* 75.1*  PLT 204 193 182   Basic Metabolic Panel: Recent Labs  Lab 01/24/24 1943 01/25/24 1342 01/26/24 0705  NA 132* 138 138  K 4.1 3.8 3.6  CL 98 103 103  CO2 23 26 27   GLUCOSE 171* 97 105*  BUN 20 16 18   CREATININE 0.70 0.53 0.57  CALCIUM  9.0 8.8* 8.2*  MG  --  2.1  --   PHOS  --  1.9*  --    GFR: Estimated Creatinine Clearance: 64.1 mL/min (by C-G formula based on SCr of 0.57 mg/dL). Liver Function Tests: Recent Labs  Lab 01/24/24 1943 01/25/24 1342 01/26/24 0705  AST 24 21 17   ALT 20 20 17   ALKPHOS 71 61 57  BILITOT 0.3 0.5 0.6  PROT 6.8 6.3* 5.8*  ALBUMIN 4.1 3.7 3.3*   No results for input(s): LIPASE, AMYLASE in the last 168 hours. No results for input(s): AMMONIA in the last 168 hours. Coagulation Profile: Recent Labs  Lab 01/24/24 1943  INR 0.9   Cardiac Enzymes: No results for input(s): CKTOTAL, CKMB, CKMBINDEX, TROPONINI in the last 168 hours. BNP (last 3 results) Recent Labs    01/25/24 1342  PROBNP <50.0   HbA1C: Recent Labs    01/25/24 1450  HGBA1C 5.7*   CBG: Recent Labs  Lab 01/25/24 1719 01/25/24 2101 01/26/24 0737 01/26/24 1126  GLUCAP 107* 109* 116* 140*   Lipid Profile: No results for input(s): CHOL, HDL, LDLCALC, TRIG, CHOLHDL, LDLDIRECT in the last 72 hours. Thyroid  Function Tests: No results for input(s): TSH, T4TOTAL, FREET4, T3FREE, THYROIDAB in the last 72 hours. Anemia Panel: No results for input(s): VITAMINB12, FOLATE, FERRITIN, TIBC, IRON, RETICCTPCT in the last 72 hours. Sepsis Labs: Recent Labs  Lab 01/24/24 1943 01/24/24 2155  LATICACIDVEN 2.6* 1.8    Recent Results (from the past 240 hours)  Culture, blood (Routine x 2)     Status: None (Preliminary result)    Collection Time: 01/24/24  7:37 PM   Specimen: BLOOD  Result Value Ref Range Status   Specimen Description   Final    BLOOD RIGHT ANTECUBITAL Performed at [facility], [address] [place], [place] [address]    Special Requests   Final    Blood Culture results may not be optimal due to an inadequate volume of blood received in culture bottles BOTTLES DRAWN AEROBIC AND ANAEROBIC Performed at [facility], [address], [place], [place] [address]    Culture   Final    NO GROWTH 2 DAYS Performed at [facility], [address] [place], [place] [address]    Report Status PENDING  Incomplete  SARS Coronavirus 2 by RT PCR (hospital order, performed in [facility] hospital lab) *cepheid single result test* Anterior Nasal Swab     Status: None   Collection Time: 01/24/24  9:50 PM   Specimen: Anterior Nasal Swab  Result Value Ref Range Status   SARS Coronavirus 2 by RT PCR NEGATIVE NEGATIVE Final    Comment: (  NOTE) SARS-CoV-2 target nucleic acids are NOT DETECTED.  The SARS-CoV-2 RNA is generally detectable in upper and lower respiratory specimens during the acute phase of infection. The lowest concentration of SARS-CoV-2 viral copies this assay can detect is 250 copies / mL. A negative result does not preclude SARS-CoV-2 infection and should not be used as the sole basis for treatment or other patient management decisions.  A negative result may occur with improper specimen collection / handling, submission of specimen other than nasopharyngeal swab, presence of viral mutation(s) within the areas targeted by this assay, and inadequate number of viral copies (<250 copies / mL). A negative result must be combined with clinical observations, patient history, and epidemiological information.  Fact Sheet for Patients:   RoadLapTop.co.za  Fact Sheet for Healthcare  Providers: http://kim-miller.com/  This test is not yet approved or  cleared by the [place]  FDA and has been authorized for detection and/or diagnosis of SARS-CoV-2 by FDA under an Emergency Use Authorization (EUA).  This EUA will remain in effect (meaning this test can be used) for the duration of the COVID-19 declaration under Section 564(b)(1) of the Act, 21 U.S.C. section 360bbb-3(b)(1), unless the authorization is terminated or revoked sooner.  Performed at [facility], [address] [place], [place] [address]   Culture, blood (Routine x 2)     Status: None (Preliminary result)   Collection Time: 01/25/24  1:31 PM   Specimen: BLOOD LEFT ARM  Result Value Ref Range Status   Specimen Description   Final    BLOOD LEFT ARM Performed at [facility], [address] [place], [place] [address]    Special Requests   Final    BOTTLES DRAWN AEROBIC AND ANAEROBIC Blood Culture adequate volume Performed at [facility], [address], [place], [place] [address]    Culture   Final    NO GROWTH < 24 HOURS Performed at [facility], [address] [place], [place] [address]    Report Status PENDING  Incomplete         Radiology Studies: ECHOCARDIOGRAM COMPLETE Result Date: 01/25/2024    ECHOCARDIOGRAM REPORT   Patient Name:   MONYA KOZAKIEWICZ Date of Exam: 01/25/2024 Medical Rec #:  [id number]    Height:       66.0 in Accession #:    [id number]   Weight:       165.0 lb Date of Birth:  1950/10/15   BSA:          1.843 m Patient Age:    73 years     BP:           132/74 mmHg Patient Gender: F            HR:           91 bpm. Exam Location:  Inpatient Procedure: 2D Echo, Color Doppler and Cardiac Doppler (Both Spectral and Color            Flow Doppler were utilized during procedure). Indications:    Dyspnea R06.00, Cardiomegaly I51.7  History:        Patient has no prior history of Echocardiogram examinations.   Sonographer:    Tinnie Gosling RDCS Referring Phys: [phone] DAVID MANUEL ORTIZ IMPRESSIONS  1. Left ventricular ejection fraction, by estimation, is 60 to 65%. The left ventricle has normal function. The left ventricle has no regional wall motion abnormalities. There is mild concentric left ventricular hypertrophy. Left ventricular diastolic parameters  are consistent with Grade I diastolic dysfunction (impaired relaxation).  2. Right ventricular systolic function is normal. The right ventricular size is not well visualized. Tricuspid regurgitation signal is inadequate for assessing PA pressure.  3. There is no evidence of cardiac tamponade.  4. The mitral valve is degenerative. Trivial mitral valve regurgitation.  5. The aortic valve is tricuspid. Aortic valve regurgitation is not visualized. Aortic valve sclerosis is present, with no evidence of aortic valve stenosis.  6. The inferior vena cava is normal in size with greater than 50% respiratory variability, suggesting right atrial pressure of 3 mmHg. Comparison(s): No prior Echocardiogram. FINDINGS  Left Ventricle: Left ventricular ejection fraction, by estimation, is 60 to 65%. The left ventricle has normal function. The left ventricle has no regional wall motion abnormalities. The left ventricular internal cavity size was normal in size. There is  mild concentric left ventricular hypertrophy. Left ventricular diastolic parameters are consistent with Grade I diastolic dysfunction (impaired relaxation). Right Ventricle: The right ventricular size is not well visualized. Right vetricular wall thickness was not well visualized. Right ventricular systolic function is normal. Tricuspid regurgitation signal is inadequate for assessing PA pressure. Left Atrium: Left atrial size was normal in size. Right Atrium: Right atrial size was not well visualized. Pericardium: Trivial pericardial effusion is present. The pericardial effusion is anterior to the right ventricle.  There is no evidence of cardiac tamponade. Mitral Valve: The mitral valve is degenerative in appearance. Mild mitral annular calcification. Trivial mitral valve regurgitation. Tricuspid Valve: The tricuspid valve is normal in structure. Tricuspid valve regurgitation is trivial. Aortic Valve: The aortic valve is tricuspid. Aortic valve regurgitation is not visualized. Aortic valve sclerosis is present, with no evidence of aortic valve stenosis. Pulmonic Valve: The pulmonic valve was not well visualized. Pulmonic valve regurgitation is not visualized. Aorta: The aortic root and ascending aorta are structurally normal, with no evidence of dilitation. Venous: The inferior vena cava is normal in size with greater than 50% respiratory variability, suggesting right atrial pressure of 3 mmHg. IAS/Shunts: The interatrial septum was not well visualized.  LEFT VENTRICLE PLAX 2D LVIDd:         3.40 cm   Diastology LVIDs:         2.30 cm   LV e' medial:    6.74 cm/s LV PW:         1.30 cm   LV E/e' medial:  8.8 LV IVS:        1.20 cm   LV e' lateral:   8.92 cm/s LVOT diam:     1.90 cm   LV E/e' lateral: 6.6 LV SV:         49 LV SV Index:   26 LVOT Area:     2.84 cm  RIGHT VENTRICLE             IVC RV S prime:     12.00 cm/s  IVC diam: 1.60 cm LEFT ATRIUM           Index LA diam:      3.60 cm 1.95 cm/m LA Vol (A4C): 28.9 ml 15.68 ml/m  AORTIC VALVE LVOT Vmax:   96.00 cm/s LVOT Vmean:  69.000 cm/s LVOT VTI:    0.172 m  AORTA Ao Root diam: 2.80 cm Ao Asc diam:  3.10 cm MITRAL VALVE MV Area (PHT): 3.61 cm    SHUNTS MV E velocity: 59.10 cm/s  Systemic VTI:  0.17 m MV A velocity: 79.30 cm/s  Systemic Diam: 1.90  cm MV E/A ratio:  0.75 Emeline Calender Electronically signed by Emeline Calender Signature Date/Time: 01/25/2024/4:21:22 PM    Final    CT ABDOMEN PELVIS W CONTRAST Result Date: 01/24/2024 EXAM: CT ABDOMEN AND PELVIS WITH CONTRAST 01/24/2024 09:06:17 PM TECHNIQUE: CT of the abdomen and pelvis was performed with the  administration of 100 mL of iohexol (OMNIPAQUE) 350 MG/ML injection. Multiplanar reformatted images are provided for review. Automated exposure control, iterative reconstruction, and/or weight-based adjustment of the mA/kV was utilized to reduce the radiation dose to as low as reasonably achievable. COMPARISON: None available. CLINICAL HISTORY: Abdominal pain, acute, nonlocalized. Pt states that she had a tummy tuck done yesterday and today she began to have SOB. Pt began to have swelling in bilateral legs. FINDINGS: LOWER CHEST: Patchy bilateral lower lobe opacities, likely atelectasis. LIVER: Subcentimeter left hepatic cyst, benign. GALLBLADDER AND BILE DUCTS: Gallbladder is unremarkable. No biliary ductal dilatation. SPLEEN: No acute abnormality. PANCREAS: No acute abnormality. ADRENAL GLANDS: No acute abnormality. KIDNEYS, URETERS AND BLADDER: Subcentimeter bilateral renal cysts, benign. No follow-up recommended. No stones in the kidneys or ureters. No hydronephrosis. No perinephric or periureteral stranding. Urinary bladder is unremarkable. GI AND BOWEL: Stomach demonstrates no acute abnormality. Normal appendix (image 68). There is no bowel obstruction. PERITONEUM AND RETROPERITONEUM: No ascites. No free air. VASCULATURE: Aorta is normal in caliber. LYMPH NODES: No lymphadenopathy. REPRODUCTIVE ORGANS: The uterus is within normal limits. BONES AND SOFT TISSUES: Postprocedural changes including a drain along the lower anterior abdominal wall, with scattered foci of subcutaneous gas (image 32). Mild degenerative changes of the lower lumbar spine. No acute osseous abnormality. No focal soft tissue abnormality. IMPRESSION: 1. Postprocedural changes along the anterior abdominal wall. 2. No free air. 3. No acute findings in the abdomen or pelvis. Electronically signed by: Pinkie Pebbles MD 01/24/2024 09:17 PM EDT RP Workstation: [id number]   CT Angio Chest PE W and/or Wo Contrast Result Date: 01/24/2024 EXAM:  CTA CHEST 01/24/2024 09:06:17 PM TECHNIQUE: CTA of the chest was performed after the administration of intravenous contrast. Multiplanar reformatted images are provided for review. MIP images are provided for review. Automated exposure control, iterative reconstruction, and/or weight based adjustment of the mA/kV was utilized to reduce the radiation dose to as low as reasonably achievable. COMPARISON: Chest x-ray same day. CLINICAL HISTORY: Pulmonary embolism (PE) suspected, high prob. Pt states that she had a tummy tuck done yesterday and today she began to have SOB. Pt began to have swelling in bilateral legs. FINDINGS: PULMONARY ARTERIES: Pulmonary arteries are adequately opacified for evaluation. No acute pulmonary embolus. Main pulmonary artery is normal in caliber. MEDIASTINUM: The heart is mildly enlarged. Pericardium demonstrates no acute abnormality. There is no acute abnormality of the thoracic aorta. Circumscribed nonenhancing cystic area seen in the anterior mediastinum near the ascending aorta measuring 3.8 x 1.4 x 1.6 cm. LYMPH NODES: No mediastinal, hilar or axillary lymphadenopathy. LUNGS AND PLEURA: Bands of atelectasis in the bilateral lower lobes. No focal consolidation or pulmonary edema. No evidence of pleural effusion or pneumothorax. Mild elevation of the right hemidiaphragm. UPPER ABDOMEN: Limited images of the upper abdomen are unremarkable. SOFT TISSUES AND BONES: Subcutaneous emphysema and soft tissue densities are seen in the lower chest and lateral left chest wall compatible with patient's history of recent surgery. . Surgical clips in the left breast. Small sclerotic density in the T6 vertebral body measures 8 mm. IMPRESSION: 1. No pulmonary embolism. 2. Mildly enlarged heart. 3. Indeterminate cystic area in the anterior mediastinum near the  ascending aorta measuring 3.8 x 1.4 x 1.6 cm may represent a pericardial cyst or thymic cyst. Other etiologies are not excluded. 4. Bands of  atelectasis in the bilateral lower lobes and mild elevation of the right hemidiaphragm. Electronically signed by: Greig Pique MD 01/24/2024 09:16 PM EDT RP Workstation: [id number]   DG Chest Port 1 View if patient is in a treatment room. Result Date: 01/24/2024 EXAM: 1 VIEW XRAY OF THE CHEST 01/24/2024 07:54:00 PM COMPARISON: None available. CLINICAL HISTORY: Suspected Sepsis. Pt states that she had a tummy tuck done yesterday and today she began to have SOB. Pt began to have swelling in bilateral legs. FINDINGS: LUNGS AND PLEURA: Low lung volumes. No focal pulmonary opacity. No pulmonary edema. No pleural effusion. No pneumothorax. HEART AND MEDIASTINUM: No acute abnormality of the cardiac and mediastinal silhouettes. BONES AND SOFT TISSUES: Lucency beneath the right hemidiaphragm is likely related to bowe,l but indeterminate. No acute osseous abnormality. Surgery clips along the left lower chest. Subcutaneous gas along the right lateral abdominal wall likely postprocedural. IMPRESSION: 1. No acute cardiopulmonary abnormality. 2. Lucency beneath the right hemidiaphragm is favored to represent bowel. If clinically concerned for pneumoperitoneum, consider CT for further evaluation. 3. Subcutaneous gas along the right lateral abdominal wall, postprocedural. Electronically signed by: Pinkie Pebbles MD 01/24/2024 08:02 PM EDT RP Workstation: [id number]        Scheduled Meds:  atorvastatin   10 mg Oral Daily   budesonide (PULMICORT) nebulizer solution  0.5 mg Nebulization BID   furosemide  20 mg Intravenous Once   guaiFENesin  600 mg Oral BID   insulin aspart  0-15 Units Subcutaneous TID WC   ipratropium-albuterol   3 mL Nebulization TID   pantoprazole   40 mg Oral Daily   Continuous Infusions:  piperacillin-tazobactam (ZOSYN)  IV 3.375 g (01/26/24 0512)          Sophie Mao, MD [facility] 01/26/2024, 2:19 PM

## 2024-01-26 NOTE — TOC Initial Note (Signed)
 Transition of Care [facility]) - Initial/Assessment Note    Patient Details  Name: Brenda [name] MRN: [id number] Date of Birth: [birth year]  Transition of Care [facility]) CM/SW Contact:    Brenda Manuella Quill, RN Phone Number: 01/26/2024, 5:24 PM  Clinical Narrative:                 [name] w/ pt and spouse Laurier Carbon [phone]) in room; pt said she lives at home; she plans to return at d/c; her spouse will provide transportation; pt verified insurance/PCP; she denied SDOH risks; pt does not have DME, HH services, or home oxygen; IP CM following.  Expected Discharge Plan: Home/Self Care Barriers to Discharge: Continued Medical Work up   Patient Goals and CMS Choice Patient states their goals for this hospitalization and ongoing recovery are:: home CMS Medicare.gov Compare Post Acute Care list provided to:: Patient        Expected Discharge Plan and Services   Discharge Planning Services: CM Consult   Living arrangements for the past 2 months: Single Family Home                 DME Arranged: N/A DME Agency: NA       HH Arranged: NA HH Agency: NA        Prior Living Arrangements/Services Living arrangements for the past 2 months: Single Family Home Lives with:: Spouse Patient language and need for interpreter reviewed:: Yes Do you feel safe going back to the place where you live?: Yes      Need for Family Participation in Patient Care: Yes (Comment) Care giver support system in place?: Yes (comment) Current home services:  (n/a) Criminal Activity/Legal Involvement Pertinent to Current Situation/Hospitalization: No - Comment as needed  Activities of Daily Living   ADL Screening (condition at time of admission) Independently performs ADLs?: Yes (appropriate for developmental age) Is the patient deaf or have difficulty hearing?: No Does the patient have difficulty seeing, even when wearing glasses/contacts?: No Does the patient have difficulty concentrating,  remembering, or making decisions?: No  Permission Sought/Granted Permission sought to share information with : Case Manager Permission granted to share information with : Yes, Verbal Permission Granted  Share Information with NAME: Case Manager     Permission granted to share info w Relationship: Caysie Minnifield (spouse) [phone]     Emotional Assessment Appearance:: Appears stated age Attitude/Demeanor/Rapport: Gracious Affect (typically observed): Accepting Orientation: : Oriented to Self, Oriented to Place, Oriented to  Time, Oriented to Situation Alcohol / Substance Use: Not Applicable Psych Involvement: No (comment)  Admission diagnosis:  Shortness of breath [R06.02] Hypoxia [R09.02] Patient Active Problem List   Diagnosis Date Noted   Hypophosphatemia 01/25/2024   Microcytic anemia 01/25/2024   Cardiomegaly 01/25/2024   Pneumoperitoneum 01/25/2024   Acute dyspnea 01/24/2024   Lower respiratory infection 07/24/2023   Gastroesophageal reflux disease with esophagitis without hemorrhage 07/24/2023   Primary osteoarthritis of left shoulder 04/27/2021   Spinal stenosis 02/22/2021   Alcohol abuse 11/30/2020   Bilateral hearing loss 11/30/2020   Insomnia 11/30/2020   Mucous polyp of cervix 11/30/2020   Degenerative arthritis of knee, bilateral 03/20/2018   Prediabetes 01/17/2018   Arthralgia 11/17/2015   Left lumbar radiculopathy 10/25/2015   Menopausal state 08/22/2011   Hyperlipidemia 04/25/2010   HEARING LOSS 09/15/2009   PCP:  Purcell Emil Schanz, MD Pharmacy:   [facility] [id number] [place] - [address] [place] [address] Phone: [phone] Fax: [phone]  [facility] [place], [place] - [address] [place] [address] Phone: [phone] Fax: [phone]     Social Drivers of Health (SDOH) Social History: SDOH Screenings   Food Insecurity: No Food Insecurity  (01/26/2024)  Housing: Low Risk  (01/26/2024)  Transportation Needs: No Transportation Needs (01/26/2024)  Utilities: Not At Risk (01/26/2024)  Alcohol Screen: Low Risk  (07/24/2023)  Depression (PHQ2-9): Low Risk  (07/24/2023)  Financial Resource Strain: Low Risk  (07/24/2023)  Physical Activity: Sufficiently Active (07/24/2023)  Social Connections: Socially Integrated (01/25/2024)  Stress: No Stress Concern Present (07/24/2023)  Tobacco Use: Low Risk  (01/25/2024)  Health Literacy: Adequate Health Literacy (07/24/2023)   SDOH Interventions: Food Insecurity Interventions: Intervention Not Indicated, Inpatient TOC Housing Interventions: Intervention Not Indicated, Inpatient TOC Transportation Interventions: Intervention Not Indicated, Inpatient TOC Utilities Interventions: Intervention Not Indicated, Inpatient TOC   Readmission Risk Interventions     No data to display

## 2024-01-27 ENCOUNTER — Other Ambulatory Visit (HOSPITAL_COMMUNITY): Payer: Self-pay

## 2024-01-27 DIAGNOSIS — R0600 Dyspnea, unspecified: Secondary | ICD-10-CM | POA: Diagnosis not present

## 2024-01-27 LAB — GLUCOSE, CAPILLARY
Glucose-Capillary: 130 mg/dL — ABNORMAL HIGH (ref 70–99)
Glucose-Capillary: 112 mg/dL — ABNORMAL HIGH (ref 70–99)

## 2024-01-27 LAB — BASIC METABOLIC PANEL WITH GFR
Anion gap: 11 (ref 5–15)
BUN: 17 mg/dL (ref 8–23)
CO2: 26 mmol/L (ref 22–32)
Calcium: 8.9 mg/dL (ref 8.9–10.3)
Chloride: 102 mmol/L (ref 98–111)
Creatinine, Ser: 0.65 mg/dL (ref 0.44–1.00)
GFR, Estimated: >60 mL/min (ref >60)
Glucose, Bld: 110 mg/dL — ABNORMAL HIGH (ref 70–99)
Potassium: 3.7 mmol/L (ref 3.5–5.1)
Sodium: 139 mmol/L (ref 135–145)

## 2024-01-27 LAB — MAGNESIUM: Magnesium: 2.2 mg/dL (ref 1.7–2.4)

## 2024-01-27 MED ORDER — DM-GUAIFENESIN ER 30-600 MG PO TB12
1.0000 | ORAL_TABLET | Freq: Two times a day (BID) | ORAL | 0 refills | Status: AC | PRN
  Filled 2024-01-27: qty 20, 10d supply, fill #0

## 2024-01-27 MED ORDER — FUROSEMIDE 40 MG PO TABS
40.0000 mg | ORAL_TABLET | Freq: Once | ORAL | Status: AC
  Administered 2024-01-27: 40 mg via ORAL
  Filled 2024-01-27: qty 1

## 2024-01-27 MED ORDER — ALBUTEROL SULFATE HFA 108 (90 BASE) MCG/ACT IN AERS
2.0000 | INHALATION_SPRAY | RESPIRATORY_TRACT | 0 refills | Status: AC | PRN
  Filled 2024-01-27: qty 6.7, 30d supply, fill #0

## 2024-01-27 MED ORDER — GUAIFENESIN-DM 100-10 MG/5ML PO SYRP
5.0000 mL | ORAL_SOLUTION | ORAL | Status: DC | PRN
  Administered 2024-01-27: 5 mL via ORAL
  Filled 2024-01-27: qty 5

## 2024-01-27 NOTE — Discharge Summary (Signed)
 Physician Discharge Summary  NEWELL FRATER FMW:[id number] DOB: 11/13/1950 DOA: 01/24/2024  PCP: Purcell Emil Schanz, MD  Admit date: 01/24/2024 Discharge date: 01/27/2024  Admitted From: Home Disposition: Home  Recommendations for Outpatient Follow-up:  Follow up with PCP in 1 week with repeat CBC/BMP Outpatient follow-up with Dr. Craig Shank.  Wound care/drain care/pain management as per Dr. Shank Follow up in ED if symptoms worsen or new appear   Home Health: No Equipment/Devices: None  Discharge Condition: Stable CODE STATUS: Full Diet recommendation: Heart healthy  Brief/Interim Summary: 73 year old female with history of iron deficiency anemia, spinal stenosis, prediabetes, GERD, mild hearing loss, hyperlipidemia, osteopenia, palpitations, right thyroid  nodule who underwent a liposuction with abdominoplasty 2 days prior to presentation with Dr. Craig pace presented with worsening shortness of breath and bilateral lower extremity edema.  On presentation, patient required supplemental oxygen.  WBC of 12.1.  Lactic acid 2.6 and 1.8.  Coronavirus PCR negative.  Chest x-ray showed no acute cardiopulmonary abnormality.  I contacted CTA chest showed no acute pulm embolism but showed bilateral lower lobe atelectasis.  CT of abdomen and pelvis with contrast showed postprocedural changes along the anterior abdominal wall with no free air and no acute findings in the abdomen or pelvis. 2D echo showed EF of 65% with grade 1 diastolic dysfunction.  During hospitalization, her condition has improved.  She is currently on room air and feels rebounded.  Discharge patient home today with outpatient follow-up with PCP.  Discharge Diagnoses:   Acute hypoxic respiratory failure Possible bilateral lower lobe atelectasis -Presented with worsening dyspnea and was tachypneic intermittently on presentation requiring supplemental oxygen.   - Imaging as above with no evidence of pulmonary embolism but  possible bilateral lower lobe atelectasis.  Continue incentive spirometry. - Treated with nebs.  Patient received a dose of oral Lasix on 01/26/2024 with good diuresis; she will be given another dose of oral Lasix this morning and if she remains stable, she will be discharged home today.  Outpatient follow-up with PCP.  Continue incentive spirometry on discharge.   History of recent liposuction with abdominoplasty - Done 2 days prior to presentation by Dr. Craig Shank.patient was seen by Dr. Shank on presentation at the ED: Patient currently does not have any surgical complications.  Outpatient follow-up with Dr. Shank.  Wound and drain care as per Dr. Shank. -CT abdomen unremarkable   Microcytic anemia - Hemoglobin stable.  No signs of bleeding.  Monitor intermittently as an outpatient   Hyperlipidemia - Continue statin   Prediabetes - A1c 5.7.  Blood sugars stable.  Outpatient follow-up   Hypophosphatemia - No labs today   Discharge Instructions  Discharge Instructions     Diet - low sodium heart healthy   Complete by: As directed    Increase activity slowly   Complete by: As directed       Allergies as of 01/27/2024       Reactions   Hydrocodone  Other (See Comments)   drowsiness        Medication List     TAKE these medications    acetaminophen  500 MG tablet Commonly known as: TYLENOL  Take 500 mg by mouth every 6 (six) hours as needed (for pain.).   albuterol  108 (90 Base) MCG/ACT inhaler Commonly known as: VENTOLIN  HFA Inhale 2 puffs into the lungs every 2 (two) hours as needed for wheezing or shortness of breath. What changed:  when to take this reasons to take this   atorvastatin  10 MG tablet  Commonly known as: LIPITOR Take 1 tablet by mouth once daily   dextromethorphan-guaiFENesin 30-600 MG 12hr tablet Commonly known as: MUCINEX DM Take 1 tablet by mouth 2 (two) times daily as needed for cough.   Dupixent 300 MG/2ML Soaj Generic drug: Dupilumab    HYDROcodone -acetaminophen  7.5-325 MG tablet Commonly known as: NORCO Take 1 tablet by mouth every 4 (four) hours as needed for moderate pain (pain score 4-6) or severe pain (pain score 7-10).   hydrOXYzine  25 MG tablet Commonly known as: ATARAX  Take 1 tablet (25 mg total) by mouth every 8 (eight) hours as needed (anxiety/itching.).   pantoprazole  40 MG tablet Commonly known as: PROTONIX  Take 1 tablet (40 mg total) by mouth daily.        Follow-up Information     Purcell Emil Schanz, MD. Schedule an appointment as soon as possible for a visit in 1 week(s).   Specialty: Internal Medicine Contact information: [address] [place] [address] [phone]         Elisabeth Craig RAMAN, MD. Schedule an appointment as soon as possible for a visit.   Specialty: Plastic Surgery Why: At earliest convenience Contact information: [address] [place] [address] [phone]                Allergies  Allergen Reactions   Hydrocodone  Other (See Comments)    drowsiness    Consultations: None   Procedures/Studies: ECHOCARDIOGRAM COMPLETE Result Date: 01/25/2024    ECHOCARDIOGRAM REPORT   Patient Name:   TEMPERANCE KELEMEN Date of Exam: 01/25/2024 Medical Rec #:  [id number]    Height:       66.0 in Accession #:    [id number]   Weight:       165.0 lb Date of Birth:  08-Feb-1951   BSA:          1.843 m Patient Age:    73 years     BP:           132/74 mmHg Patient Gender: F            HR:           91 bpm. Exam Location:  Inpatient Procedure: 2D Echo, Color Doppler and Cardiac Doppler (Both Spectral and Color            Flow Doppler were utilized during procedure). Indications:    Dyspnea R06.00, Cardiomegaly I51.7  History:        Patient has no prior history of Echocardiogram examinations.  Sonographer:    Tinnie Gosling RDCS Referring Phys: [phone] DAVID MANUEL ORTIZ IMPRESSIONS  1. Left ventricular ejection fraction, by estimation, is 60 to 65%. The left  ventricle has normal function. The left ventricle has no regional wall motion abnormalities. There is mild concentric left ventricular hypertrophy. Left ventricular diastolic parameters are consistent with Grade I diastolic dysfunction (impaired relaxation).  2. Right ventricular systolic function is normal. The right ventricular size is not well visualized. Tricuspid regurgitation signal is inadequate for assessing PA pressure.  3. There is no evidence of cardiac tamponade.  4. The mitral valve is degenerative. Trivial mitral valve regurgitation.  5. The aortic valve is tricuspid. Aortic valve regurgitation is not visualized. Aortic valve sclerosis is present, with no evidence of aortic valve stenosis.  6. The inferior vena cava is normal in size with greater than 50% respiratory variability, suggesting right atrial pressure of 3 mmHg. Comparison(s): No prior Echocardiogram. FINDINGS  Left Ventricle: Left ventricular ejection  fraction, by estimation, is 60 to 65%. The left ventricle has normal function. The left ventricle has no regional wall motion abnormalities. The left ventricular internal cavity size was normal in size. There is  mild concentric left ventricular hypertrophy. Left ventricular diastolic parameters are consistent with Grade I diastolic dysfunction (impaired relaxation). Right Ventricle: The right ventricular size is not well visualized. Right vetricular wall thickness was not well visualized. Right ventricular systolic function is normal. Tricuspid regurgitation signal is inadequate for assessing PA pressure. Left Atrium: Left atrial size was normal in size. Right Atrium: Right atrial size was not well visualized. Pericardium: Trivial pericardial effusion is present. The pericardial effusion is anterior to the right ventricle. There is no evidence of cardiac tamponade. Mitral Valve: The mitral valve is degenerative in appearance. Mild mitral annular calcification. Trivial mitral valve  regurgitation. Tricuspid Valve: The tricuspid valve is normal in structure. Tricuspid valve regurgitation is trivial. Aortic Valve: The aortic valve is tricuspid. Aortic valve regurgitation is not visualized. Aortic valve sclerosis is present, with no evidence of aortic valve stenosis. Pulmonic Valve: The pulmonic valve was not well visualized. Pulmonic valve regurgitation is not visualized. Aorta: The aortic root and ascending aorta are structurally normal, with no evidence of dilitation. Venous: The inferior vena cava is normal in size with greater than 50% respiratory variability, suggesting right atrial pressure of 3 mmHg. IAS/Shunts: The interatrial septum was not well visualized.  LEFT VENTRICLE PLAX 2D LVIDd:         3.40 cm   Diastology LVIDs:         2.30 cm   LV e' medial:    6.74 cm/s LV PW:         1.30 cm   LV E/e' medial:  8.8 LV IVS:        1.20 cm   LV e' lateral:   8.92 cm/s LVOT diam:     1.90 cm   LV E/e' lateral: 6.6 LV SV:         49 LV SV Index:   26 LVOT Area:     2.84 cm  RIGHT VENTRICLE             IVC RV S prime:     12.00 cm/s  IVC diam: 1.60 cm LEFT ATRIUM           Index LA diam:      3.60 cm 1.95 cm/m LA Vol (A4C): 28.9 ml 15.68 ml/m  AORTIC VALVE LVOT Vmax:   96.00 cm/s LVOT Vmean:  69.000 cm/s LVOT VTI:    0.172 m  AORTA Ao Root diam: 2.80 cm Ao Asc diam:  3.10 cm MITRAL VALVE MV Area (PHT): 3.61 cm    SHUNTS MV E velocity: 59.10 cm/s  Systemic VTI:  0.17 m MV A velocity: 79.30 cm/s  Systemic Diam: 1.90 cm MV E/A ratio:  0.75 Emeline Calender Electronically signed by Emeline Calender Signature Date/Time: 01/25/2024/4:21:22 PM    Final    CT ABDOMEN PELVIS W CONTRAST Result Date: 01/24/2024 EXAM: CT ABDOMEN AND PELVIS WITH CONTRAST 01/24/2024 09:06:17 PM TECHNIQUE: CT of the abdomen and pelvis was performed with the administration of 100 mL of iohexol (OMNIPAQUE) 350 MG/ML injection. Multiplanar reformatted images are provided for review. Automated exposure control, iterative  reconstruction, and/or weight-based adjustment of the mA/kV was utilized to reduce the radiation dose to as low as reasonably achievable. COMPARISON: None available. CLINICAL HISTORY: Abdominal pain, acute, nonlocalized. Pt states that she had a tummy tuck done  yesterday and today she began to have SOB. Pt began to have swelling in bilateral legs. FINDINGS: LOWER CHEST: Patchy bilateral lower lobe opacities, likely atelectasis. LIVER: Subcentimeter left hepatic cyst, benign. GALLBLADDER AND BILE DUCTS: Gallbladder is unremarkable. No biliary ductal dilatation. SPLEEN: No acute abnormality. PANCREAS: No acute abnormality. ADRENAL GLANDS: No acute abnormality. KIDNEYS, URETERS AND BLADDER: Subcentimeter bilateral renal cysts, benign. No follow-up recommended. No stones in the kidneys or ureters. No hydronephrosis. No perinephric or periureteral stranding. Urinary bladder is unremarkable. GI AND BOWEL: Stomach demonstrates no acute abnormality. Normal appendix (image 68). There is no bowel obstruction. PERITONEUM AND RETROPERITONEUM: No ascites. No free air. VASCULATURE: Aorta is normal in caliber. LYMPH NODES: No lymphadenopathy. REPRODUCTIVE ORGANS: The uterus is within normal limits. BONES AND SOFT TISSUES: Postprocedural changes including a drain along the lower anterior abdominal wall, with scattered foci of subcutaneous gas (image 32). Mild degenerative changes of the lower lumbar spine. No acute osseous abnormality. No focal soft tissue abnormality. IMPRESSION: 1. Postprocedural changes along the anterior abdominal wall. 2. No free air. 3. No acute findings in the abdomen or pelvis. Electronically signed by: Pinkie Pebbles MD 01/24/2024 09:17 PM EDT RP Workstation: [id number]   CT Angio Chest PE W and/or Wo Contrast Result Date: 01/24/2024 EXAM: CTA CHEST 01/24/2024 09:06:17 PM TECHNIQUE: CTA of the chest was performed after the administration of intravenous contrast. Multiplanar reformatted images are  provided for review. MIP images are provided for review. Automated exposure control, iterative reconstruction, and/or weight based adjustment of the mA/kV was utilized to reduce the radiation dose to as low as reasonably achievable. COMPARISON: Chest x-ray same day. CLINICAL HISTORY: Pulmonary embolism (PE) suspected, high prob. Pt states that she had a tummy tuck done yesterday and today she began to have SOB. Pt began to have swelling in bilateral legs. FINDINGS: PULMONARY ARTERIES: Pulmonary arteries are adequately opacified for evaluation. No acute pulmonary embolus. Main pulmonary artery is normal in caliber. MEDIASTINUM: The heart is mildly enlarged. Pericardium demonstrates no acute abnormality. There is no acute abnormality of the thoracic aorta. Circumscribed nonenhancing cystic area seen in the anterior mediastinum near the ascending aorta measuring 3.8 x 1.4 x 1.6 cm. LYMPH NODES: No mediastinal, hilar or axillary lymphadenopathy. LUNGS AND PLEURA: Bands of atelectasis in the bilateral lower lobes. No focal consolidation or pulmonary edema. No evidence of pleural effusion or pneumothorax. Mild elevation of the right hemidiaphragm. UPPER ABDOMEN: Limited images of the upper abdomen are unremarkable. SOFT TISSUES AND BONES: Subcutaneous emphysema and soft tissue densities are seen in the lower chest and lateral left chest wall compatible with patient's history of recent surgery. . Surgical clips in the left breast. Small sclerotic density in the T6 vertebral body measures 8 mm. IMPRESSION: 1. No pulmonary embolism. 2. Mildly enlarged heart. 3. Indeterminate cystic area in the anterior mediastinum near the ascending aorta measuring 3.8 x 1.4 x 1.6 cm may represent a pericardial cyst or thymic cyst. Other etiologies are not excluded. 4. Bands of atelectasis in the bilateral lower lobes and mild elevation of the right hemidiaphragm. Electronically signed by: Greig Pique MD 01/24/2024 09:16 PM EDT RP  Workstation: [id number]   DG Chest Port 1 View if patient is in a treatment room. Result Date: 01/24/2024 EXAM: 1 VIEW XRAY OF THE CHEST 01/24/2024 07:54:00 PM COMPARISON: None available. CLINICAL HISTORY: Suspected Sepsis. Pt states that she had a tummy tuck done yesterday and today she began to have SOB. Pt began to have swelling in bilateral legs. FINDINGS: LUNGS  AND PLEURA: Low lung volumes. No focal pulmonary opacity. No pulmonary edema. No pleural effusion. No pneumothorax. HEART AND MEDIASTINUM: No acute abnormality of the cardiac and mediastinal silhouettes. BONES AND SOFT TISSUES: Lucency beneath the right hemidiaphragm is likely related to bowe,l but indeterminate. No acute osseous abnormality. Surgery clips along the left lower chest. Subcutaneous gas along the right lateral abdominal wall likely postprocedural. IMPRESSION: 1. No acute cardiopulmonary abnormality. 2. Lucency beneath the right hemidiaphragm is favored to represent bowel. If clinically concerned for pneumoperitoneum, consider CT for further evaluation. 3. Subcutaneous gas along the right lateral abdominal wall, postprocedural. Electronically signed by: Pinkie Pebbles MD 01/24/2024 08:02 PM EDT RP Workstation: [id number]      Subjective: Patient seen and examined at bedside.  Feels better and wants to go home today.  No fever, vomiting, worsening abdominal pain reported.  Discharge Exam: Vitals:   01/27/24 1001 01/27/24 1045  BP:  123/68  Pulse:    Resp:    Temp:    SpO2: 95% 92%    General: Pt is alert, awake, not in acute distress.  On room air. Cardiovascular: rate controlled, S1/S2 + Respiratory: bilateral decreased breath sounds at bases with scattered crackles Abdominal: Soft, mild tender and distended, abdominal binder present, bowel sounds +, JP drain present Extremities: Trace lower extremity edema; no cyanosis    The results of significant diagnostics from this hospitalization (including imaging,  microbiology, ancillary and laboratory) are listed below for reference.     Microbiology: Recent Results (from the past 240 hours)  Culture, blood (Routine x 2)     Status: None (Preliminary result)   Collection Time: 01/24/24  7:37 PM   Specimen: BLOOD  Result Value Ref Range Status   Specimen Description   Final    BLOOD RIGHT ANTECUBITAL Performed at [facility], [address] [place], [place] [address]    Special Requests   Final    Blood Culture results may not be optimal due to an inadequate volume of blood received in culture bottles BOTTLES DRAWN AEROBIC AND ANAEROBIC Performed at [facility], [address], [place], [place] [address]    Culture   Final    NO GROWTH 3 DAYS Performed at [facility], [address] [place], [place] [address]    Report Status PENDING  Incomplete  SARS Coronavirus 2 by RT PCR (hospital order, performed in [facility] hospital lab) *cepheid single result test* Anterior Nasal Swab     Status: None   Collection Time: 01/24/24  9:50 PM   Specimen: Anterior Nasal Swab  Result Value Ref Range Status   SARS Coronavirus 2 by RT PCR NEGATIVE NEGATIVE Final    Comment: (NOTE) SARS-CoV-2 target nucleic acids are NOT DETECTED.  The SARS-CoV-2 RNA is generally detectable in upper and lower respiratory specimens during the acute phase of infection. The lowest concentration of SARS-CoV-2 viral copies this assay can detect is 250 copies / mL. A negative result does not preclude SARS-CoV-2 infection and should not be used as the sole basis for treatment or other patient management decisions.  A negative result may occur with improper specimen collection / handling, submission of specimen other than nasopharyngeal swab, presence of viral mutation(s) within the areas targeted by this assay, and inadequate number of viral copies (<250 copies / mL). A negative result must be combined with  clinical observations, patient history, and epidemiological information.  Fact Sheet for Patients:   RoadLapTop.co.za  Fact Sheet for Healthcare Providers:  http://kim-miller.com/  This test is not yet approved or  cleared by the [place]  FDA and has been authorized for detection and/or diagnosis of SARS-CoV-2 by FDA under an Emergency Use Authorization (EUA).  This EUA will remain in effect (meaning this test can be used) for the duration of the COVID-19 declaration under Section 564(b)(1) of the Act, 21 U.S.C. section 360bbb-3(b)(1), unless the authorization is terminated or revoked sooner.  Performed at [facility], [address] [place], [place] [address]   Culture, blood (Routine x 2)     Status: None (Preliminary result)   Collection Time: 01/25/24  1:31 PM   Specimen: BLOOD LEFT ARM  Result Value Ref Range Status   Specimen Description   Final    BLOOD LEFT ARM Performed at [facility], [address] [place], [place] [address]    Special Requests   Final    BOTTLES DRAWN AEROBIC AND ANAEROBIC Blood Culture adequate volume Performed at [facility], [address] [place], [place] [address]    Culture   Final    NO GROWTH 2 DAYS Performed at [facility], [address], [place], [place] [address]    Report Status PENDING  Incomplete     Labs: BNP (last 3 results) No results for input(s): BNP in the last 8760 hours. Basic Metabolic Panel: Recent Labs  Lab 01/24/24 1943 01/25/24 1342 01/26/24 0705 01/27/24 0651  NA 132* 138 138 139  K 4.1 3.8 3.6 3.7  CL 98 103 103 102  CO2 23 26 27 26   GLUCOSE 171* 97 105* 110*  BUN 20 16 18 17   CREATININE 0.70 0.53 0.57 0.65  CALCIUM  9.0 8.8* 8.2* 8.9  MG  --  2.1  --  2.2  PHOS  --  1.9*  --   --    Liver Function Tests: Recent Labs  Lab 01/24/24 1943 01/25/24 1342 01/26/24 0705  AST 24 21 17   ALT 20 20  17   ALKPHOS 71 61 57  BILITOT 0.3 0.5 0.6  PROT 6.8 6.3* 5.8*  ALBUMIN 4.1 3.7 3.3*   No results for input(s): LIPASE, AMYLASE in the last 168 hours. No results for input(s): AMMONIA in the last 168 hours. CBC: Recent Labs  Lab 01/24/24 1943 01/25/24 1342 01/26/24 0705  WBC 12.1* 10.1 7.3  NEUTROABS 8.9*  --   --   HGB 11.0* 10.6* 10.3*  HCT 35.0* 34.5* 33.2*  MCV 73.1* 74.7* 75.1*  PLT 204 193 182   Cardiac Enzymes: No results for input(s): CKTOTAL, CKMB, CKMBINDEX, TROPONINI in the last 168 hours. BNP: Invalid input(s): POCBNP CBG: Recent Labs  Lab 01/26/24 0737 01/26/24 1126 01/26/24 1625 01/26/24 2138 01/27/24 0804  GLUCAP 116* 140* 126* 122* 130*   D-Dimer No results for input(s): DDIMER in the last 72 hours. Hgb A1c Recent Labs    01/25/24 1450  HGBA1C 5.7*   Lipid Profile No results for input(s): CHOL, HDL, LDLCALC, TRIG, CHOLHDL, LDLDIRECT in the last 72 hours. Thyroid  function studies No results for input(s): TSH, T4TOTAL, T3FREE, THYROIDAB in the last 72 hours.  Invalid input(s): FREET3 Anemia work up No results for input(s): VITAMINB12, FOLATE, FERRITIN, TIBC, IRON, RETICCTPCT in the last 72 hours. Urinalysis    Component Value Date/Time   COLORURINE YELLOW 01/24/2024 2055   APPEARANCEUR CLEAR 01/24/2024 2055   LABSPEC 1.021 01/24/2024 2055   PHURINE 5.5 01/24/2024 2055   GLUCOSEU NEGATIVE 01/24/2024 2055   GLUCOSEU NEGATIVE 12/26/2017 1125  HGBUR NEGATIVE 01/24/2024 2055   BILIRUBINUR NEGATIVE 01/24/2024 2055   KETONESUR NEGATIVE 01/24/2024 2055   PROTEINUR NEGATIVE 01/24/2024 2055   UROBILINOGEN 0.2 12/26/2017 1125   NITRITE NEGATIVE 01/24/2024 2055   LEUKOCYTESUR NEGATIVE 01/24/2024 2055   Sepsis Labs Recent Labs  Lab 01/24/24 1943 01/25/24 1342 01/26/24 0705  WBC 12.1* 10.1 7.3   Microbiology Recent Results (from the past 240 hours)  Culture, blood (Routine x 2)      Status: None (Preliminary result)   Collection Time: 01/24/24  7:37 PM   Specimen: BLOOD  Result Value Ref Range Status   Specimen Description   Final    BLOOD RIGHT ANTECUBITAL Performed at [facility], [address], [place], [place] [address]    Special Requests   Final    Blood Culture results may not be optimal due to an inadequate volume of blood received in culture bottles BOTTLES DRAWN AEROBIC AND ANAEROBIC Performed at [facility], [address], [place], [place] [address]    Culture   Final    NO GROWTH 3 DAYS Performed at [facility], [address] [place], [place] [address]    Report Status PENDING  Incomplete  SARS Coronavirus 2 by RT PCR (hospital order, performed in [facility] hospital lab) *cepheid single result test* Anterior Nasal Swab     Status: None   Collection Time: 01/24/24  9:50 PM   Specimen: Anterior Nasal Swab  Result Value Ref Range Status   SARS Coronavirus 2 by RT PCR NEGATIVE NEGATIVE Final    Comment: (NOTE) SARS-CoV-2 target nucleic acids are NOT DETECTED.  The SARS-CoV-2 RNA is generally detectable in upper and lower respiratory specimens during the acute phase of infection. The lowest concentration of SARS-CoV-2 viral copies this assay can detect is 250 copies / mL. A negative result does not preclude SARS-CoV-2 infection and should not be used as the sole basis for treatment or other patient management decisions.  A negative result may occur with improper specimen collection / handling, submission of specimen other than nasopharyngeal swab, presence of viral mutation(s) within the areas targeted by this assay, and inadequate number of viral copies (<250 copies / mL). A negative result must be combined with clinical observations, patient history, and epidemiological information.  Fact Sheet for Patients:   RoadLapTop.co.za  Fact Sheet for Healthcare  Providers: http://kim-miller.com/  This test is not yet approved or  cleared by the [place]  FDA and has been authorized for detection and/or diagnosis of SARS-CoV-2 by FDA under an Emergency Use Authorization (EUA).  This EUA will remain in effect (meaning this test can be used) for the duration of the COVID-19 declaration under Section 564(b)(1) of the Act, 21 U.S.C. section 360bbb-3(b)(1), unless the authorization is terminated or revoked sooner.  Performed at [facility], [address] [place], [place] [address]   Culture, blood (Routine x 2)     Status: None (Preliminary result)   Collection Time: 01/25/24  1:31 PM   Specimen: BLOOD LEFT ARM  Result Value Ref Range Status   Specimen Description   Final    BLOOD LEFT ARM Performed at [facility], [address], [place], [place] [address]    Special Requests   Final    BOTTLES DRAWN AEROBIC AND ANAEROBIC Blood Culture adequate volume Performed at [facility] [facility], [address] [place], [place] [address]    Culture   Final    NO GROWTH 2 DAYS  Performed at [facility], [address] [place], [place] [address]    Report Status PENDING  Incomplete     Time coordinating discharge: 35 minutes  SIGNED:   Sophie Mao, MD  [facility] 01/27/2024, 11:14 AM

## 2024-01-27 NOTE — Plan of Care (Signed)

## 2024-01-27 NOTE — TOC Transition Note (Signed)
 Transition of Care [facility]) - Discharge Note   Patient Details  Name: Brenda [name] MRN: [id number] Date of Birth: [birth year]  Transition of Care [facility]) CM/SW Contact:  Sonda Manuella Quill, RN Phone Number: 01/27/2024, 11:27 AM   Clinical Narrative:    D/C orders received; no IP CM needs.   Final next level of care: Home/Self Care Barriers to Discharge: No Barriers Identified   Patient Goals and CMS Choice Patient states their goals for this hospitalization and ongoing recovery are:: home CMS Medicare.gov Compare Post Acute Care list provided to:: Patient        Discharge Placement                       Discharge Plan and Services Additional resources added to the After Visit Summary for     Discharge Planning Services: CM Consult            DME Arranged: N/A DME Agency: NA       HH Arranged: NA HH Agency: NA        Social Drivers of Health (SDOH) Interventions SDOH Screenings   Food Insecurity: No Food Insecurity (01/26/2024)  Housing: Low Risk  (01/26/2024)  Transportation Needs: No Transportation Needs (01/26/2024)  Utilities: Not At Risk (01/26/2024)  Alcohol Screen: Low Risk  (07/24/2023)  Depression (PHQ2-9): Low Risk  (07/24/2023)  Financial Resource Strain: Low Risk  (07/24/2023)  Physical Activity: Sufficiently Active (07/24/2023)  Social Connections: Socially Integrated (01/25/2024)  Stress: No Stress Concern Present (07/24/2023)  Tobacco Use: Low Risk  (01/25/2024)  Health Literacy: Adequate Health Literacy (07/24/2023)     Readmission Risk Interventions     No data to display

## 2024-01-28 ENCOUNTER — Other Ambulatory Visit (HOSPITAL_COMMUNITY): Payer: Self-pay

## 2024-01-28 ENCOUNTER — Telehealth: Payer: Self-pay

## 2024-01-28 NOTE — Transitions of Care (Post Inpatient/ED Visit) (Signed)
   01/28/2024  Name: Brenda [name] MRN: [id number] DOB: 1951-03-24  Today's TOC FU Call Status: Today's TOC FU Call Status:: Successful TOC FU Call Completed TOC FU Call Complete Date: 01/28/24 Patient's Name and Date of Birth confirmed.  Transition Care Management Follow-up Telephone Call Date of Discharge: 01/27/24 Discharge Facility: [facility]) Type of Discharge: Inpatient Admission Primary Inpatient Discharge Diagnosis:: dyspnea How have you been since you were released from the hospital?: Better Any questions or concerns?: No  Items Reviewed: Did you receive and understand the discharge instructions provided?: Yes Medications obtained,verified, and reconciled?: Yes (Medications Reviewed) Any new allergies since your discharge?: No Dietary orders reviewed?: Yes Do you have support at home?: Yes People in Home [RPT]: spouse  Medications Reviewed Today: Medications Reviewed Today     Reviewed by Emmitt Pan, LPN (Licensed Practical Nurse) on 01/28/24 at 1439  Med List Status: <None>   Medication Order Taking? Sig Documenting Provider Last Dose Status Informant  acetaminophen  (TYLENOL ) 500 MG tablet [id number] Yes Take 500 mg by mouth every 6 (six) hours as needed (for pain.). [provider]  Active Self  albuterol  (VENTOLIN  HFA) 108 (90 Base) MCG/ACT inhaler [id number] Yes Inhale 2 puffs into the lungs every 2 (two) hours as needed for wheezing or shortness of breath. Cheryle Page, MD  Active   atorvastatin  (LIPITOR) 10 MG tablet [id number] Yes Take 1 tablet by mouth once daily Sagardia, Emil Schanz, MD  Active   dextromethorphan-guaiFENesin (MUCINEX DM) 30-600 MG 12hr tablet [id number] Yes Take 1 tablet by mouth 2 (two) times daily as needed for cough. Cheryle Page, MD  Active   DUPIXENT 300 MG/2ML NELMA [id number] Yes  [provider]  Active   HYDROcodone -acetaminophen  (NORCO) 7.5-325 MG tablet [id number] Yes Take 1 tablet by mouth every 4 (four)  hours as needed for moderate pain (pain score 4-6) or severe pain (pain score 7-10). [provider]  Active   hydrOXYzine  (ATARAX ) 25 MG tablet [id number] Yes Take 1 tablet (25 mg total) by mouth every 8 (eight) hours as needed (anxiety/itching.). Sagardia, Miguel Jose, MD  Active   pantoprazole  (PROTONIX ) 40 MG tablet [id number]  Take 1 tablet (40 mg total) by mouth daily.  Patient not taking: Reported on 01/28/2024   Nandigam, Kavitha V, MD  Active   Med List Note Steffi Nian, CPhT 01/27/24 1028): [facility]             Home Care and Equipment/Supplies: Were Home Health Services Ordered?: NA Any new equipment or medical supplies ordered?: NA  Functional Questionnaire: Do you need assistance with bathing/showering or dressing?: Yes Do you need assistance with meal preparation?: No Do you need assistance with eating?: No Do you have difficulty maintaining continence: No Do you need assistance with getting out of bed/getting out of a chair/moving?: No Do you have difficulty managing or taking your medications?: No  Follow up appointments reviewed: PCP Follow-up appointment confirmed?: Yes Date of PCP follow-up appointment?: 01/30/24 Follow-up Provider: sagardia Specialist [facility] Follow-up appointment confirmed?: Yes Date of Specialist follow-up appointment?: 01/29/24 Follow-Up Specialty Provider:: surgeon Do you need transportation to your follow-up appointment?: No Do you understand care options if your condition(s) worsen?: Yes-patient verbalized understanding    SIGNATURE Pan Emmitt, LPN [facility] Nurse Health Advisor Direct Dial [phone]

## 2024-01-29 LAB — CULTURE, BLOOD (ROUTINE X 2): Culture: NO GROWTH

## 2024-01-30 ENCOUNTER — Encounter: Payer: Self-pay | Admitting: Emergency Medicine

## 2024-01-30 ENCOUNTER — Ambulatory Visit (INDEPENDENT_AMBULATORY_CARE_PROVIDER_SITE_OTHER): Admitting: Emergency Medicine

## 2024-01-30 VITALS — BP 120/78 | HR 101 | Temp 97.9°F | Ht 66.0 in | Wt 160.2 lb

## 2024-01-30 DIAGNOSIS — J9811 Atelectasis: Secondary | ICD-10-CM

## 2024-01-30 DIAGNOSIS — Z09 Encounter for follow-up examination after completed treatment for conditions other than malignant neoplasm: Secondary | ICD-10-CM | POA: Diagnosis not present

## 2024-01-30 DIAGNOSIS — R7303 Prediabetes: Secondary | ICD-10-CM

## 2024-01-30 LAB — CULTURE, BLOOD (ROUTINE X 2)
Culture: NO GROWTH
Special Requests: ADEQUATE

## 2024-01-30 NOTE — Assessment & Plan Note (Signed)
 Bibasilar atelectasis.  Clinically stable.  No red flag signs or symptoms. Oxygenating well.  Afebrile.  Normal vital signs.  No tachypnea. Benign examination with clear lungs.  No wheezing Recent imaging reports reviewed in particular CTA of chest ED precautions given Advised to contact the office if no better or worse during the next few days

## 2024-01-30 NOTE — Assessment & Plan Note (Signed)
 Lab Results  Component Value Date   HGBA1C 5.7 (H) 01/25/2024  Diet and nutrition discussed Advised to decrease amount of daily carbohydrate intake and daily calories and increase amount of plant-based protein in her diet

## 2024-01-30 NOTE — Progress Notes (Signed)
 Arland KATHEE Carbon 73 y.o.   Chief Complaint  Patient presents with   Hospitalization Follow-up    Shortness of breath. Stated she had surgery.     HISTORY OF PRESENT ILLNESS: This is a 73 y.o. female here for hospital discharge follow-up Feels much better but still has occasional dyspnea on exertion No other complaints or medical concerns today.  Physician Discharge Summary  SHAQUILA SIGMAN FMW:[id number] DOB: 22-Dec-1950 DOA: 01/24/2024   PCP: [facility]cell Emil Schanz, MD   Admit date: 01/24/2024 Discharge date: 01/27/2024   Admitted From: Home Disposition: Home   Recommendations for Outpatient Follow-up:  Follow up with PCP in 1 week with repeat CBC/BMP Outpatient follow-up with Dr. Craig Shank.  Wound care/drain care/pain management as per Dr. Shank Follow up in ED if symptoms worsen or new appear     Home Health: No Equipment/Devices: None   Discharge Condition: Stable CODE STATUS: Full Diet recommendation: Heart healthy   Brief/Interim Summary: 73 year old female with history of iron deficiency anemia, spinal stenosis, prediabetes, GERD, mild hearing loss, hyperlipidemia, osteopenia, palpitations, right thyroid  nodule who underwent a liposuction with abdominoplasty 2 days prior to presentation with Dr. Craig pace presented with worsening shortness of breath and bilateral lower extremity edema.  On presentation, patient required supplemental oxygen.  WBC of 12.1.  Lactic acid 2.6 and 1.8.  Coronavirus PCR negative.  Chest x-ray showed no acute cardiopulmonary abnormality.  I contacted CTA chest showed no acute pulm embolism but showed bilateral lower lobe atelectasis.  CT of abdomen and pelvis with contrast showed postprocedural changes along the anterior abdominal wall with no free air and no acute findings in the abdomen or pelvis. 2D echo showed EF of 65% with grade 1 diastolic dysfunction.  During hospitalization, her condition has improved.  She is currently on room air and  feels rebounded.  Discharge patient home today with outpatient follow-up with PCP.   Discharge Diagnoses:    Acute hypoxic respiratory failure Possible bilateral lower lobe atelectasis -Presented with worsening dyspnea and was tachypneic intermittently on presentation requiring supplemental oxygen.   - Imaging as above with no evidence of pulmonary embolism but possible bilateral lower lobe atelectasis.  Continue incentive spirometry. - Treated with nebs.  Patient received a dose of oral Lasix on 01/26/2024 with good diuresis; she will be given another dose of oral Lasix this morning and if she remains stable, she will be discharged home today.  Outpatient follow-up with PCP.  Continue incentive spirometry on discharge.   History of recent liposuction with abdominoplasty - Done 2 days prior to presentation by Dr. Craig Shank.patient was seen by Dr. Shank on presentation at the ED: Patient currently does not have any surgical complications.  Outpatient follow-up with Dr. Shank.  Wound and drain care as per Dr. Shank. -CT abdomen unremarkable   Microcytic anemia - Hemoglobin stable.  No signs of bleeding.  Monitor intermittently as an outpatient   Hyperlipidemia - Continue statin   Prediabetes - A1c 5.7.  Blood sugars stable.  Outpatient follow-up   Hypophosphatemia - No labs today    HPI   Prior to Admission medications   Medication Sig Start Date End Date Taking? Authorizing Provider  albuterol  (VENTOLIN  HFA) 108 (90 Base) MCG/ACT inhaler Inhale 2 puffs into the lungs every 2 (two) hours as needed for wheezing or shortness of breath. 01/27/24  Yes Cheryle Page, MD  atorvastatin  (LIPITOR) 10 MG tablet Take 1 tablet by mouth once daily 01/16/24  Yes Dvid Pendry, Emil Schanz, MD  dextromethorphan-guaiFENesin (MUCINEX DM) 30-600 MG 12hr tablet Take 1 tablet by mouth 2 (two) times daily as needed for cough. 01/27/24  Yes Cheryle Page, MD  acetaminophen  (TYLENOL ) 500 MG tablet Take 500 mg  by mouth every 6 (six) hours as needed (for pain.). Patient not taking: Reported on 01/30/2024    [provider]  DUPIXENT 300 MG/2ML SOPN     [provider]  HYDROcodone -acetaminophen  (NORCO) 7.5-325 MG tablet Take 1 tablet by mouth every 4 (four) hours as needed for moderate pain (pain score 4-6) or severe pain (pain score 7-10). Patient not taking: Reported on 01/30/2024 01/14/24   [provider]  hydrOXYzine  (ATARAX ) 25 MG tablet Take 1 tablet (25 mg total) by mouth every 8 (eight) hours as needed (anxiety/itching.). Patient not taking: Reported on 01/30/2024 09/28/22   Purcell Emil Schanz, MD  pantoprazole  (PROTONIX ) 40 MG tablet Take 1 tablet (40 mg total) by mouth daily. Patient not taking: Reported on 01/28/2024 05/21/23   Shila Gustav GAILS, MD    Allergies  Allergen Reactions   Hydrocodone  Other (See Comments)    drowsiness    Patient Active Problem List   Diagnosis Date Noted   Hypophosphatemia 01/25/2024   Microcytic anemia 01/25/2024   Cardiomegaly 01/25/2024   Pneumoperitoneum 01/25/2024   Acute dyspnea 01/24/2024   Lower respiratory infection 07/24/2023   Gastroesophageal reflux disease with esophagitis without hemorrhage 07/24/2023   Primary osteoarthritis of left shoulder 04/27/2021   Spinal stenosis 02/22/2021   Alcohol abuse 11/30/2020   Bilateral hearing loss 11/30/2020   Insomnia 11/30/2020   Mucous polyp of cervix 11/30/2020   Degenerative arthritis of knee, bilateral 03/20/2018   Prediabetes 01/17/2018   Arthralgia 11/17/2015   Left lumbar radiculopathy 10/25/2015   Menopausal state 08/22/2011   Hyperlipidemia 04/25/2010   HEARING LOSS 09/15/2009    Past Medical History:  Diagnosis Date   ABDOMINAL/PELVIC SWELLING MASS/LUMP UNSPEC SITE 09/18/2007   ACUTE URIS OF UNSPECIFIED SITE 06/06/2007   Allergy    Anemia    Anxiety    no meds   Arthritis of hip    08/13/2019: per patient both hips   Arthritis of knee, left     Arthritis of right knee    ASTHMATIC BRONCHITIS, ACUTE 05/11/2008   DM 09/18/2007   borderline -diet controlled- no meds   GERD (gastroesophageal reflux disease)    Glucose intolerance (impaired glucose tolerance)    HEARING LOSS 09/15/2009    mild -no hearing aids   HYPERLIPIDEMIA 04/25/2010   OSTEOPENIA 04/20/2007   Palpitations 03/13/2008   Pneumonia    Spinal stenosis 02/22/2021   THYROID  NODULE, RIGHT 05/11/2008    Past Surgical History:  Procedure Laterality Date   ABDOMINAL SURGERY     BREAST BIOPSY Left 05/22/2019   BREAST BIOPSY Left 06/2020   DILATION AND CURETTAGE, DIAGNOSTIC / THERAPEUTIC     HAMMER TOE SURGERY     RIGHT   HERNIA REPAIR  1992   KNEE SURGERY  1991   Left   Left Knee cyst  1985   Lymph Node Removal     RADIOACTIVE SEED GUIDED EXCISIONAL BREAST BIOPSY Left 08/19/2019   Procedure: LEFT BREAST RADIOACTIVE SEED GUIDED EXCISIONAL BREAST BIOPSY;  Surgeon: Aron Shoulders, MD;  Location: [facility] OR;  Service: General;  Laterality: Left;    Social History   Socioeconomic History   Marital status: Married    Spouse name: Cedric   Number of children: 2   Years of education: masters   Highest education  level: Not on file  Occupational History   Occupation: Risk manager: [employer]  Tobacco Use   Smoking status: Never    Passive exposure: Never   Smokeless tobacco: Never  Vaping Use   Vaping status: Never Used  Substance and Sexual Activity   Alcohol use: Yes    Alcohol/week: 14.0 standard drinks of alcohol    Types: 14 Glasses of wine per week    Comment: occ   Drug use: No   Sexual activity: Yes    Birth control/protection: Post-menopausal  Other Topics Concern   Not on file  Social History Narrative   Married   Social Drivers of Health   Financial Resource Strain: Low Risk  (07/24/2023)   Overall Financial Resource Strain (CARDIA)    Difficulty of Paying Living Expenses: Not hard at all  Food Insecurity: No  Food Insecurity (01/26/2024)   Hunger Vital Sign    Worried About Running Out of Food in the Last Year: Never true    Ran Out of Food in the Last Year: Never true  Transportation Needs: No Transportation Needs (01/26/2024)   PRAPARE - Administrator, Civil Service (Medical): No    Lack of Transportation (Non-Medical): No  Physical Activity: Sufficiently Active (07/24/2023)   Exercise Vital Sign    Days of Exercise per Week: 5 days    Minutes of Exercise per Session: 40 min  Stress: No Stress Concern Present (07/24/2023)   [employer] of Occupational Health - Occupational Stress Questionnaire    Feeling of Stress : Not at all  Social Connections: Socially Integrated (01/25/2024)   Social Connection and Isolation Panel    Frequency of Communication with Friends and Family: More than three times a week    Frequency of Social Gatherings with Friends and Family: More than three times a week    Attends Religious Services: More than 4 times per year    Active Member of [employer] or Organizations: Yes    Attends Banker Meetings: More than 4 times per year    Marital Status: Married  Catering manager Violence: Not At Risk (01/26/2024)   Humiliation, Afraid, Rape, and Kick questionnaire    Fear of Current or Ex-Partner: No    Emotionally Abused: No    Physically Abused: No    Sexually Abused: No    Family History  Problem Relation Age of Onset   Cancer Mother        Breast Cancer   Breast cancer Mother    Cancer Father        Stomach Cancer   Stomach cancer Father    Cancer Sister        Colon Cancer   Breast cancer Sister    Kidney disease Brother        Kidney Transplant   Colon cancer Neg Hx    Esophageal cancer Neg Hx    Rectal cancer Neg Hx      Review of Systems  Constitutional: Negative.  Negative for chills and fever.  HENT: Negative.  Negative for congestion and sore throat.   Respiratory:  Positive for shortness of breath (Dyspnea on  exertion).   Cardiovascular: Negative.  Negative for chest pain and palpitations.  Gastrointestinal:  Negative for abdominal pain, diarrhea, nausea and vomiting.  Genitourinary: Negative.  Negative for dysuria and hematuria.  Skin: Negative.  Negative for rash.  Neurological: Negative.  Negative for dizziness and headaches.    Vitals:  01/30/24 1341  BP: 120/78  Pulse: (!) 101  Temp: 97.9 F (36.6 C)  SpO2: 94%    Physical Exam Vitals reviewed.  Constitutional:      Appearance: Normal appearance.  HENT:     Head: Normocephalic.     Mouth/Throat:     Mouth: Mucous membranes are moist.     Pharynx: Oropharynx is clear.  Eyes:     Extraocular Movements: Extraocular movements intact.  Cardiovascular:     Rate and Rhythm: Normal rate and regular rhythm.     Pulses: Normal pulses.     Heart sounds: Normal heart sounds.  Pulmonary:     Effort: Pulmonary effort is normal.     Breath sounds: Normal breath sounds.  Skin:    General: Skin is warm and dry.     Capillary Refill: Capillary refill takes less than 2 seconds.  Neurological:     Mental Status: She is alert and oriented to person, place, and time.  Psychiatric:        Mood and Affect: Mood normal.        Behavior: Behavior normal.      ASSESSMENT & PLAN: A total of 42 minutes was spent with the patient and counseling/coordination of care regarding preparing for this visit, review of most recent office visit notes, review of most recent hospital discharge summary, review of multiple chronic medical conditions and their management, review of most recent imaging reports, review of all medications, review of most recent bloodwork results, review of health maintenance items, education on nutrition, prognosis, documentation, and need for follow up.  Problem List Items Addressed This Visit       Respiratory   Atelectasis, bilateral - Primary   Bibasilar atelectasis.  Clinically stable.  No red flag signs or  symptoms. Oxygenating well.  Afebrile.  Normal vital signs.  No tachypnea. Benign examination with clear lungs.  No wheezing Recent imaging reports reviewed in particular CTA of chest ED precautions given Advised to contact the office if no better or worse during the next few days        Other   Prediabetes   Lab Results  Component Value Date   HGBA1C 5.7 (H) 01/25/2024  Diet and nutrition discussed Advised to decrease amount of daily carbohydrate intake and daily calories and increase amount of plant-based protein in her diet       Other Visit Diagnoses       Hospital discharge follow-up          Patient Instructions  Atelectasis, Adult  Atelectasis is a collapse of air sacs in the lungs (alveoli). The condition causes all or part of a lung to collapse. Atelectasis can happen quickly or over a long period of time. The severity depends on the size of the area of lung tissue involved and the underlying cause. Atelectasis that happens over a long period (chronic atelectasis) often leads to infection, scarring, and other problems. What are the causes? This condition may be caused by: Shallow breathing. Limited activity or position changes. A blockage in an airway that may come from: A buildup of mucus. A tumor. An inhaled object (foreign body). A blood clot in the lungs. Pressure on the outside of the lung that comes from: A tumor. A buildup of fluid outside the lungs (pleural effusion). Air leaking between the lung and rib cage (pneumothorax). Enlarged lymph nodes. An injury to the chest wall. What increases the risk? The following factors may make you more likely to develop  this condition: Having had surgery on the chest or abdomen. An injury or health problem that makes taking deep breaths difficult or painful, such as broken ribs. Certain infections or diseases, such as pneumonia or cystic fibrosis. Taking medicines, such as sedatives, that decrease the rate of your  breathing or how deeply you breathe. Being in bed or lying flat for long periods of time. What are the signs or symptoms? In many cases, there are no symptoms. When symptoms do occur, they may include: Shortness of breath. A cough. Fast and shallow breathing. How is this diagnosed? This condition may be diagnosed based on: Your symptoms and medical history. A physical exam. A chest X-ray. Sometimes, other tests are needed to diagnose the condition. How is this treated? Treatment for this condition depends on the cause. Treatment may involve: Getting out of bed and walking around. Coughing. Coughing helps loosen mucus in the airway. Deep breathing exercises. An incentive spirometer is a device that guides you to take deep breaths. Positive pressure breathing. This is a form of breathing assistance in which air is forced into the lungs when you breathe in (inhale). Chest physiotherapy. This is a treatment to help loosen and clear mucus from the airways. It is done by clapping the chest. Follow these instructions at home: Take over-the-counter and prescription medicines only as told by your health care provider. Stay as active as possible as told by your health care provider. Make sure to lie on your unaffected side when you are lying down. For example, if you have atelectasis in your left lung, lie on your right side. This will help mucus drain from your airway. Cough and take deep breaths often. Use your incentive spirometer as told by your health care provider. Do not use any products that contain nicotine or tobacco. These products include cigarettes, chewing tobacco, and vaping devices, such as e-cigarettes. If you need help quitting, ask your health care provider. Keep all follow-up visits. This is important. Contact a health care provider if: You have a fever. Get help right away if: You have trouble breathing. You have severe chest pain. You cough up blood. Your symptoms  suddenly get worse. These symptoms may be an emergency. Get help right away. Call 911. Do not wait to see if the symptoms will go away. Do not drive yourself to the hospital. Summary Atelectasis is a collapse of air sacs in the lungs (alveoli). The condition causes all or part of a lung to collapse. Cough and take deep breaths often. Use your incentive spirometer as told by your health care provider. Stay as active as possible as told by your health care provider. Get help right away if your symptoms suddenly get worse. This information is not intended to replace advice given to you by your health care provider. Make sure you discuss any questions you have with your health care provider. Document Revised: 05/01/2021 Document Reviewed: 05/01/2021 Elsevier Patient Education  2024 Elsevier Inc.    Emil Schaumann, MD [facility]

## 2024-01-30 NOTE — Patient Instructions (Signed)
 Atelectasis, Adult  Atelectasis is a collapse of air sacs in the lungs (alveoli). The condition causes all or part of a lung to collapse. Atelectasis can happen quickly or over a long period of time. The severity depends on the size of the area of lung tissue involved and the underlying cause. Atelectasis that happens over a long period (chronic atelectasis) often leads to infection, scarring, and other problems. What are the causes? This condition may be caused by: Shallow breathing. Limited activity or position changes. A blockage in an airway that may come from: A buildup of mucus. A tumor. An inhaled object (foreign body). A blood clot in the lungs. Pressure on the outside of the lung that comes from: A tumor. A buildup of fluid outside the lungs (pleural effusion). Air leaking between the lung and rib cage (pneumothorax). Enlarged lymph nodes. An injury to the chest wall. What increases the risk? The following factors may make you more likely to develop this condition: Having had surgery on the chest or abdomen. An injury or health problem that makes taking deep breaths difficult or painful, such as broken ribs. Certain infections or diseases, such as pneumonia or cystic fibrosis. Taking medicines, such as sedatives, that decrease the rate of your breathing or how deeply you breathe. Being in bed or lying flat for long periods of time. What are the signs or symptoms? In many cases, there are no symptoms. When symptoms do occur, they may include: Shortness of breath. A cough. Fast and shallow breathing. How is this diagnosed? This condition may be diagnosed based on: Your symptoms and medical history. A physical exam. A chest X-ray. Sometimes, other tests are needed to diagnose the condition. How is this treated? Treatment for this condition depends on the cause. Treatment may involve: Getting out of bed and walking around. Coughing. Coughing helps loosen mucus in the  airway. Deep breathing exercises. An incentive spirometer is a device that guides you to take deep breaths. Positive pressure breathing. This is a form of breathing assistance in which air is forced into the lungs when you breathe in (inhale). Chest physiotherapy. This is a treatment to help loosen and clear mucus from the airways. It is done by clapping the chest. Follow these instructions at home: Take over-the-counter and prescription medicines only as told by your health care provider. Stay as active as possible as told by your health care provider. Make sure to lie on your unaffected side when you are lying down. For example, if you have atelectasis in your left lung, lie on your right side. This will help mucus drain from your airway. Cough and take deep breaths often. Use your incentive spirometer as told by your health care provider. Do not use any products that contain nicotine or tobacco. These products include cigarettes, chewing tobacco, and vaping devices, such as e-cigarettes. If you need help quitting, ask your health care provider. Keep all follow-up visits. This is important. Contact a health care provider if: You have a fever. Get help right away if: You have trouble breathing. You have severe chest pain. You cough up blood. Your symptoms suddenly get worse. These symptoms may be an emergency. Get help right away. Call 911. Do not wait to see if the symptoms will go away. Do not drive yourself to the hospital. Summary Atelectasis is a collapse of air sacs in the lungs (alveoli). The condition causes all or part of a lung to collapse. Cough and take deep breaths often. Use your incentive  spirometer as told by your health care provider. Stay as active as possible as told by your health care provider. Get help right away if your symptoms suddenly get worse. This information is not intended to replace advice given to you by your health care provider. Make sure you discuss any  questions you have with your health care provider. Document Revised: 05/01/2021 Document Reviewed: 05/01/2021 Elsevier Patient Education  2024 [employer].

## 2024-02-13 ENCOUNTER — Telehealth: Payer: Self-pay

## 2024-02-13 NOTE — Telephone Encounter (Signed)
 Received a call from [place] with the [facility]. She needs the anesthesia start and stop times for this pt's EGD on 05/21/23. Was not able to find the results in Epic. Once we have the information [place] would like a call back to [phone]. [place] [phone] and we are able to leave a message with the information.

## 2024-02-14 NOTE — Telephone Encounter (Signed)
 [facility]ed [facility] [place] [facility] The start time for anesthesia was 0937 and the end time was 1001. Relayed this information and she read back and verbalized confirmation.

## 2024-03-09 IMAGING — DX DG CHEST 2V
2 series · 2 of 2 positions shown · non-contrast
Comparison: 08/15/2017

CLINICAL DATA: Persistent cough

EXAM:
CHEST - 2 VIEW

[chest pa]
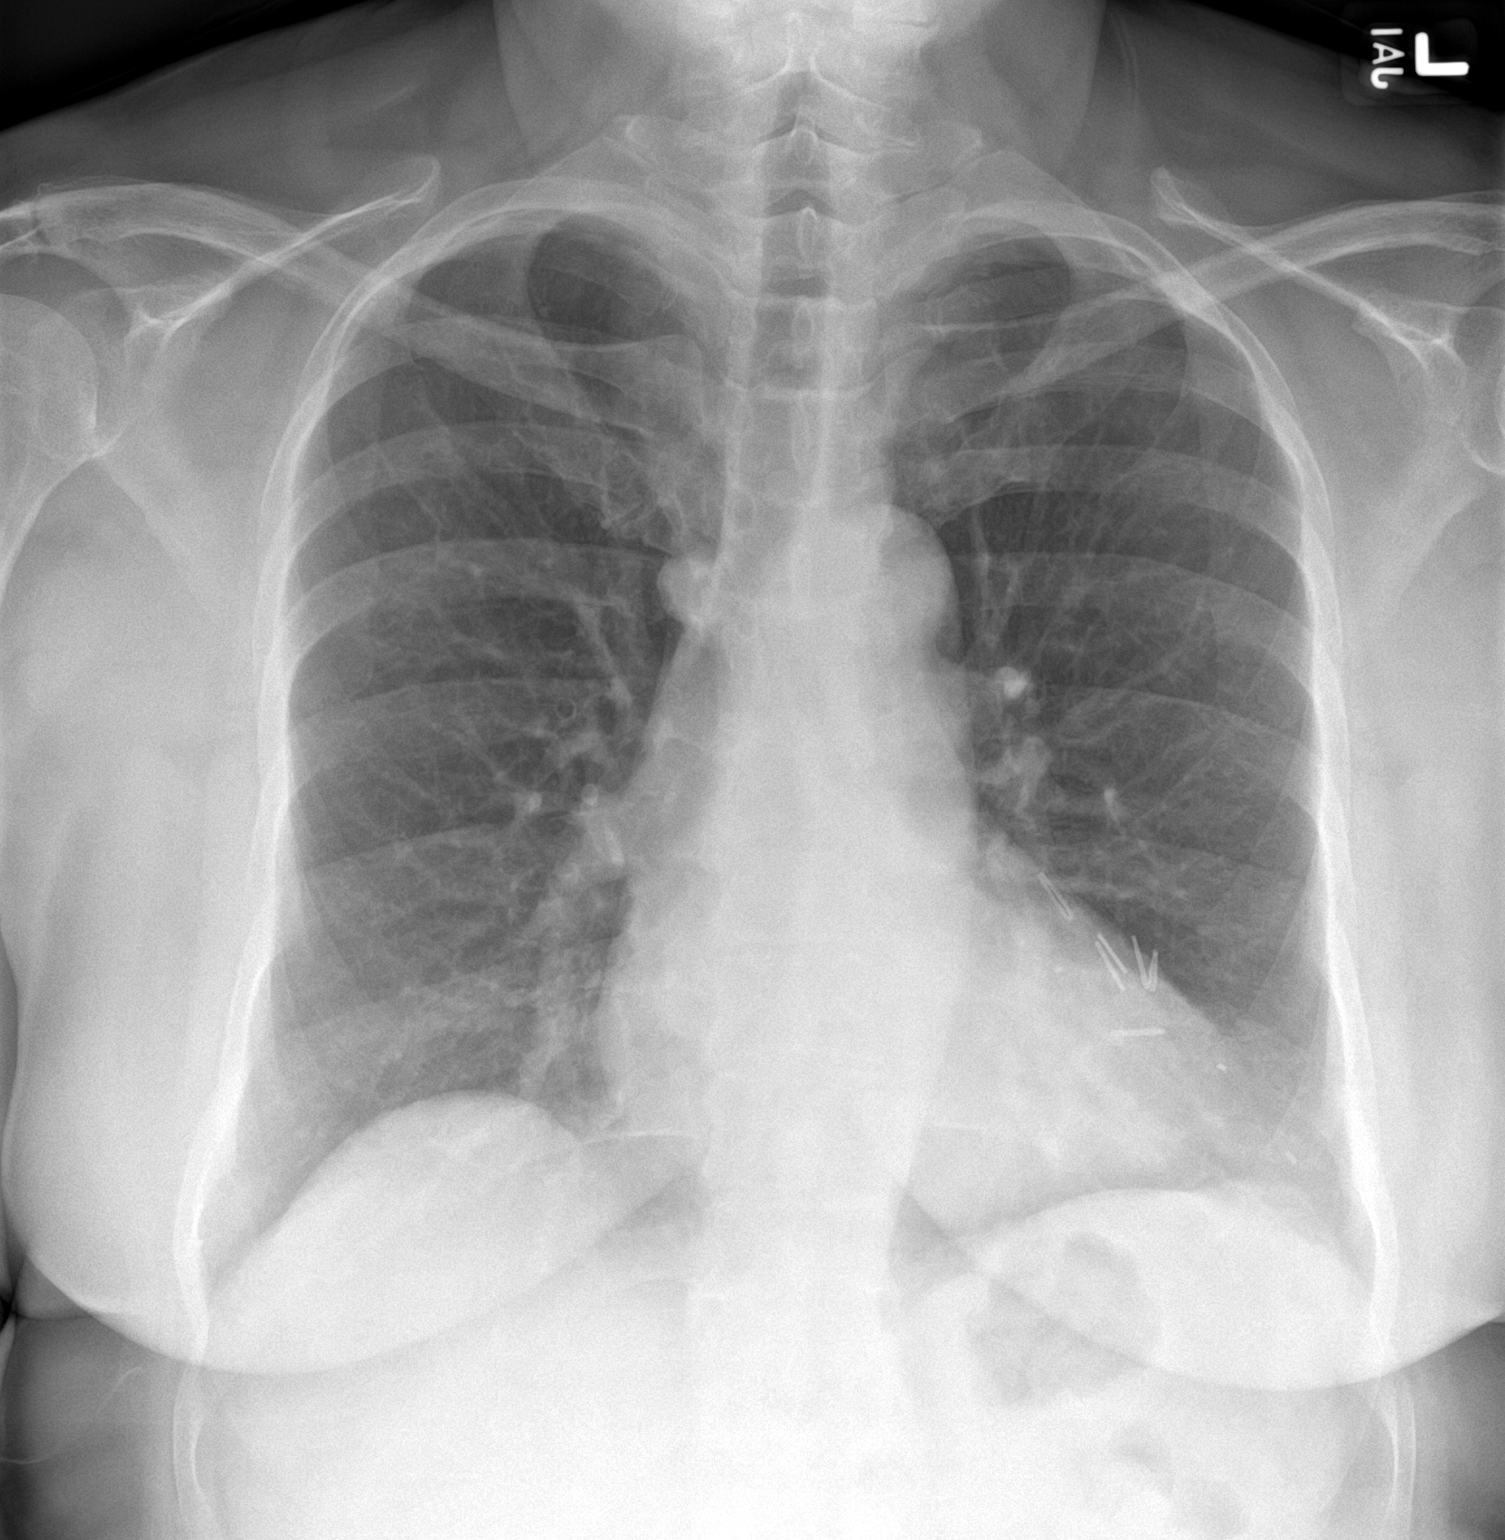

[chest lat]
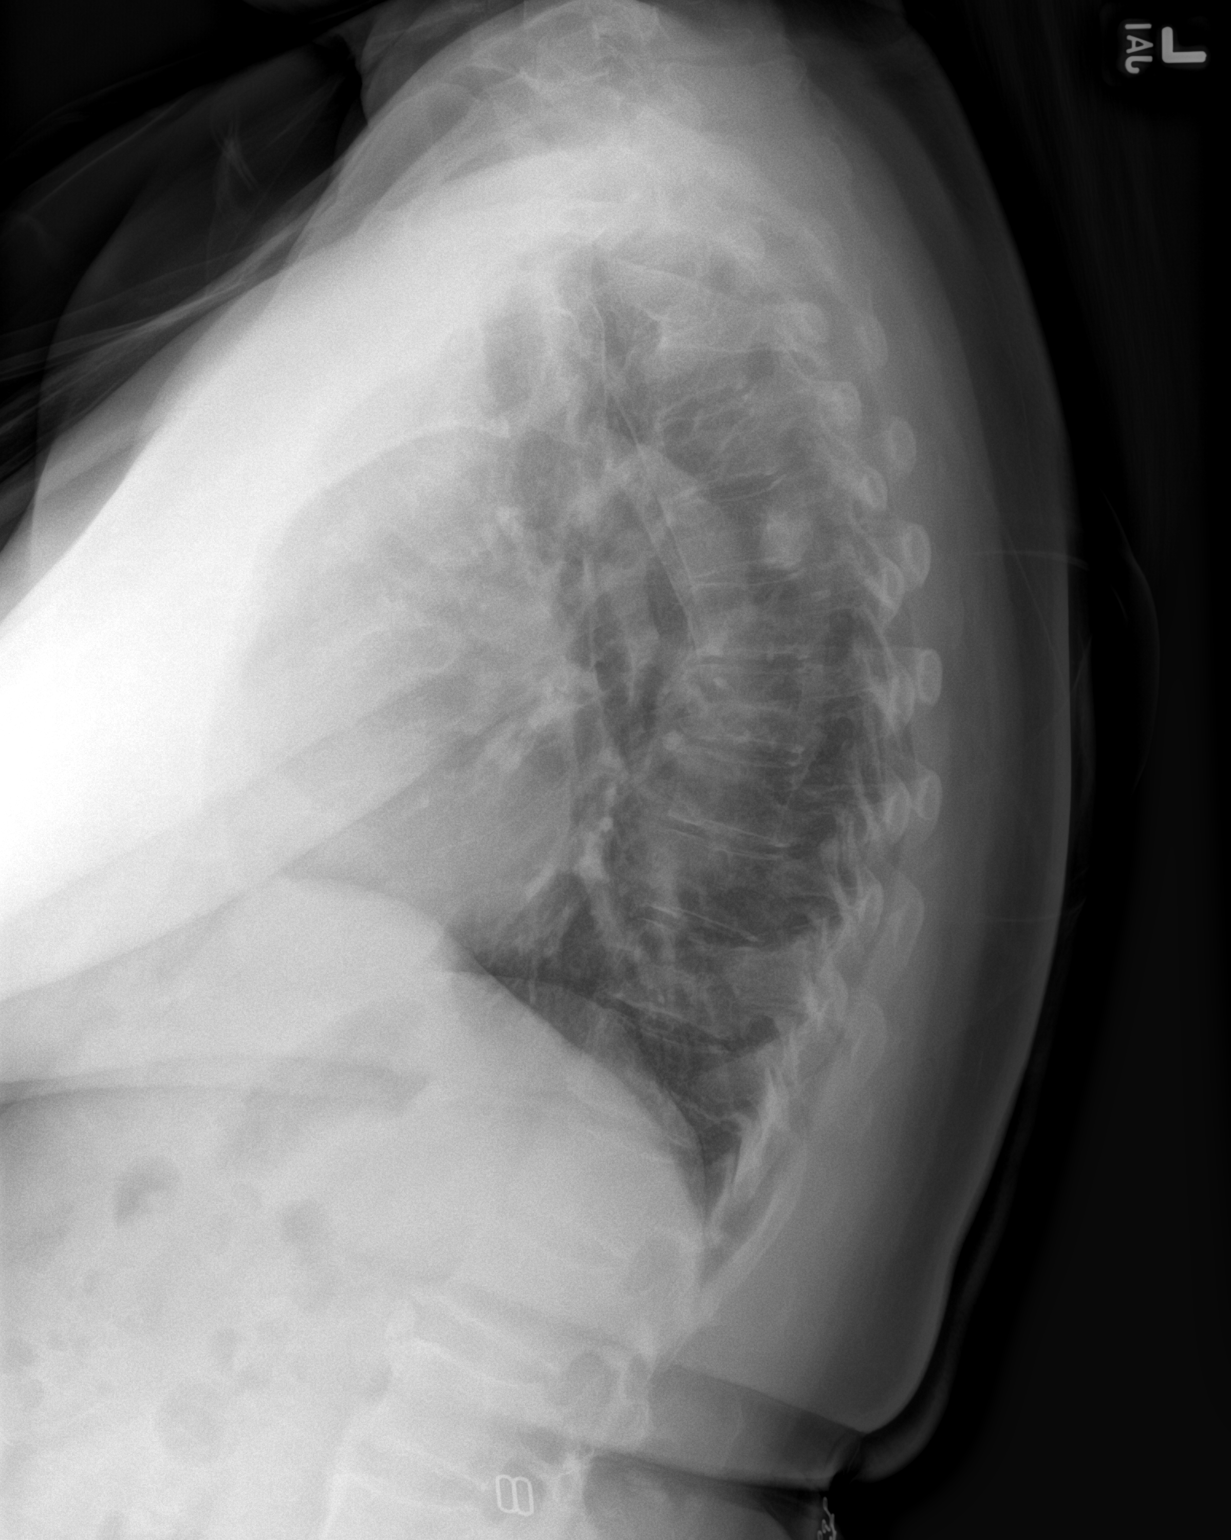

[2 of 2 positions shown; findings below may reference images not displayed]

FINDINGS: Transverse diameter of heart is slightly increased. There are no
signs of pulmonary edema or focal pulmonary consolidation. Surgical
clips are seen in the left anterior chest wall.
IMPRESSION: No active cardiopulmonary disease.

## 2024-04-14 ENCOUNTER — Other Ambulatory Visit: Payer: Self-pay | Admitting: Obstetrics and Gynecology

## 2024-04-14 DIAGNOSIS — R928 Other abnormal and inconclusive findings on diagnostic imaging of breast: Secondary | ICD-10-CM

## 2024-04-15 DIAGNOSIS — E785 Hyperlipidemia, unspecified: Secondary | ICD-10-CM

## 2024-04-18 ENCOUNTER — Other Ambulatory Visit (HOSPITAL_COMMUNITY): Payer: Self-pay

## 2024-04-24 ENCOUNTER — Other Ambulatory Visit: Payer: Self-pay | Admitting: Obstetrics and Gynecology

## 2024-04-24 ENCOUNTER — Ambulatory Visit
Admission: RE | Admit: 2024-04-24 | Discharge: 2024-04-24 | Disposition: A | Source: Ambulatory Visit | Attending: Obstetrics and Gynecology | Admitting: Obstetrics and Gynecology

## 2024-04-24 ENCOUNTER — Ambulatory Visit: Admission: RE | Admit: 2024-04-24 | Source: Ambulatory Visit

## 2024-04-24 DIAGNOSIS — R928 Other abnormal and inconclusive findings on diagnostic imaging of breast: Secondary | ICD-10-CM

## 2024-04-29 ENCOUNTER — Telehealth: Payer: Self-pay

## 2024-04-29 NOTE — Telephone Encounter (Signed)
 Copied from CRM [id number]. Topic: Clinical - Medical Advice >> Apr 29, 2024 10:29 AM Herma G wrote: Reason for CRM: Pt requested a call back at [phone] to discuss if yellow fever vaccines are offered at the clinic.

## 2024-07-31 ENCOUNTER — Ambulatory Visit

## 2024-07-31 ENCOUNTER — Encounter: Admitting: Internal Medicine

## 2024-08-07 ENCOUNTER — Encounter: Admitting: Emergency Medicine

## 2024-10-24 ENCOUNTER — Encounter
